# Patient Record
Sex: Female | Born: 1950 | Race: White | Hispanic: No | Marital: Married | Smoking: Never smoker
Health system: Southern US, Community
[De-identification: ages and names within clinical notes are randomized; demographics above are authoritative.]

## PROBLEM LIST (undated history)

## (undated) DIAGNOSIS — E119 Type 2 diabetes mellitus without complications: Secondary | ICD-10-CM

## (undated) DIAGNOSIS — R519 Headache, unspecified: Secondary | ICD-10-CM

## (undated) DIAGNOSIS — G4733 Obstructive sleep apnea (adult) (pediatric): Secondary | ICD-10-CM

## (undated) DIAGNOSIS — E785 Hyperlipidemia, unspecified: Secondary | ICD-10-CM

## (undated) DIAGNOSIS — J42 Unspecified chronic bronchitis: Secondary | ICD-10-CM

## (undated) DIAGNOSIS — Z9989 Dependence on other enabling machines and devices: Secondary | ICD-10-CM

## (undated) DIAGNOSIS — J449 Chronic obstructive pulmonary disease, unspecified: Secondary | ICD-10-CM

## (undated) DIAGNOSIS — J189 Pneumonia, unspecified organism: Secondary | ICD-10-CM

## (undated) DIAGNOSIS — J309 Allergic rhinitis, unspecified: Secondary | ICD-10-CM

## (undated) DIAGNOSIS — R51 Headache: Secondary | ICD-10-CM

## (undated) DIAGNOSIS — I10 Essential (primary) hypertension: Secondary | ICD-10-CM

## (undated) DIAGNOSIS — M1990 Unspecified osteoarthritis, unspecified site: Secondary | ICD-10-CM

## (undated) DIAGNOSIS — K219 Gastro-esophageal reflux disease without esophagitis: Secondary | ICD-10-CM

## (undated) DIAGNOSIS — J3800 Paralysis of vocal cords and larynx, unspecified: Secondary | ICD-10-CM

## (undated) DIAGNOSIS — J45909 Unspecified asthma, uncomplicated: Secondary | ICD-10-CM

## (undated) HISTORY — PX: APPENDECTOMY: SHX54

## (undated) HISTORY — DX: Hyperlipidemia, unspecified: E78.5

## (undated) HISTORY — DX: Obstructive sleep apnea (adult) (pediatric): G47.33

## (undated) HISTORY — DX: Unspecified asthma, uncomplicated: J45.909

## (undated) HISTORY — DX: Paralysis of vocal cords and larynx, unspecified: J38.00

## (undated) HISTORY — PX: KNEE ARTHROSCOPY: SUR90

## (undated) HISTORY — DX: Type 2 diabetes mellitus without complications: E11.9

## (undated) HISTORY — DX: Allergic rhinitis, unspecified: J30.9

## (undated) HISTORY — PX: CATARACT EXTRACTION, BILATERAL: SHX1313

## (undated) HISTORY — PX: ROTATOR CUFF REPAIR: SHX139

## (undated) HISTORY — PX: CHOLECYSTECTOMY: SHX55

## (undated) HISTORY — DX: Essential (primary) hypertension: I10

## (undated) HISTORY — DX: Dependence on other enabling machines and devices: Z99.89

---

## 2000-04-06 ENCOUNTER — Ambulatory Visit (HOSPITAL_BASED_OUTPATIENT_CLINIC_OR_DEPARTMENT_OTHER): Admission: RE | Admit: 2000-04-06 | Discharge: 2000-04-06 | Payer: Self-pay | Admitting: Internal Medicine

## 2001-11-02 ENCOUNTER — Other Ambulatory Visit: Admission: RE | Admit: 2001-11-02 | Discharge: 2001-11-02 | Payer: Self-pay | Admitting: Diagnostic Radiology

## 2002-05-18 HISTORY — PX: OTHER SURGICAL HISTORY: SHX169

## 2004-04-02 ENCOUNTER — Ambulatory Visit (HOSPITAL_BASED_OUTPATIENT_CLINIC_OR_DEPARTMENT_OTHER): Admission: RE | Admit: 2004-04-02 | Discharge: 2004-04-02 | Payer: Self-pay | Admitting: Internal Medicine

## 2004-08-07 ENCOUNTER — Observation Stay (HOSPITAL_COMMUNITY): Admission: EM | Admit: 2004-08-07 | Discharge: 2004-08-08 | Payer: Self-pay | Admitting: Orthopedic Surgery

## 2004-10-22 ENCOUNTER — Ambulatory Visit: Payer: Self-pay | Admitting: Internal Medicine

## 2005-01-22 ENCOUNTER — Ambulatory Visit: Payer: Self-pay | Admitting: Internal Medicine

## 2005-03-30 ENCOUNTER — Ambulatory Visit: Payer: Self-pay | Admitting: Internal Medicine

## 2005-07-22 ENCOUNTER — Ambulatory Visit: Payer: Self-pay | Admitting: Internal Medicine

## 2005-09-24 ENCOUNTER — Ambulatory Visit: Payer: Self-pay | Admitting: Internal Medicine

## 2005-09-29 ENCOUNTER — Ambulatory Visit: Payer: Self-pay | Admitting: Internal Medicine

## 2005-09-30 ENCOUNTER — Ambulatory Visit: Payer: Self-pay | Admitting: Cardiology

## 2005-10-05 ENCOUNTER — Ambulatory Visit: Payer: Self-pay | Admitting: Internal Medicine

## 2005-11-04 ENCOUNTER — Ambulatory Visit (HOSPITAL_COMMUNITY): Admission: RE | Admit: 2005-11-04 | Discharge: 2005-11-04 | Payer: Self-pay | Admitting: Otolaryngology

## 2006-01-04 ENCOUNTER — Ambulatory Visit: Payer: Self-pay | Admitting: Internal Medicine

## 2006-01-21 ENCOUNTER — Ambulatory Visit: Payer: Self-pay | Admitting: Internal Medicine

## 2006-03-18 ENCOUNTER — Ambulatory Visit: Payer: Self-pay | Admitting: Internal Medicine

## 2006-07-07 ENCOUNTER — Ambulatory Visit: Payer: Self-pay | Admitting: Internal Medicine

## 2006-10-14 ENCOUNTER — Ambulatory Visit: Payer: Self-pay | Admitting: Pulmonary Disease

## 2007-01-03 ENCOUNTER — Ambulatory Visit: Payer: Self-pay | Admitting: Internal Medicine

## 2007-01-25 ENCOUNTER — Encounter: Admission: RE | Admit: 2007-01-25 | Discharge: 2007-01-25 | Payer: Self-pay | Admitting: Orthopedic Surgery

## 2007-02-17 ENCOUNTER — Ambulatory Visit (HOSPITAL_COMMUNITY): Admission: RE | Admit: 2007-02-17 | Discharge: 2007-02-18 | Payer: Self-pay | Admitting: Orthopedic Surgery

## 2007-06-09 ENCOUNTER — Telehealth: Payer: Self-pay | Admitting: Internal Medicine

## 2007-06-17 DIAGNOSIS — J3089 Other allergic rhinitis: Secondary | ICD-10-CM

## 2007-06-17 DIAGNOSIS — J302 Other seasonal allergic rhinitis: Secondary | ICD-10-CM | POA: Insufficient documentation

## 2007-06-17 DIAGNOSIS — J383 Other diseases of vocal cords: Secondary | ICD-10-CM | POA: Insufficient documentation

## 2007-07-20 ENCOUNTER — Ambulatory Visit: Payer: Self-pay | Admitting: Internal Medicine

## 2007-08-06 DIAGNOSIS — G4733 Obstructive sleep apnea (adult) (pediatric): Secondary | ICD-10-CM | POA: Insufficient documentation

## 2007-12-30 ENCOUNTER — Telehealth (INDEPENDENT_AMBULATORY_CARE_PROVIDER_SITE_OTHER): Payer: Self-pay | Admitting: *Deleted

## 2008-01-20 ENCOUNTER — Ambulatory Visit: Payer: Self-pay | Admitting: Internal Medicine

## 2008-02-08 ENCOUNTER — Encounter: Payer: Self-pay | Admitting: Internal Medicine

## 2008-04-27 ENCOUNTER — Ambulatory Visit: Payer: Self-pay | Admitting: Internal Medicine

## 2008-05-25 ENCOUNTER — Encounter: Payer: Self-pay | Admitting: Internal Medicine

## 2008-06-11 ENCOUNTER — Telehealth: Payer: Self-pay | Admitting: Internal Medicine

## 2008-06-13 ENCOUNTER — Ambulatory Visit: Payer: Self-pay | Admitting: Internal Medicine

## 2008-06-13 DIAGNOSIS — J45909 Unspecified asthma, uncomplicated: Secondary | ICD-10-CM | POA: Insufficient documentation

## 2008-10-25 ENCOUNTER — Encounter: Payer: Self-pay | Admitting: Internal Medicine

## 2008-11-01 ENCOUNTER — Telehealth: Payer: Self-pay | Admitting: Internal Medicine

## 2008-11-02 ENCOUNTER — Encounter: Payer: Self-pay | Admitting: Internal Medicine

## 2009-10-16 ENCOUNTER — Telehealth (INDEPENDENT_AMBULATORY_CARE_PROVIDER_SITE_OTHER): Payer: Self-pay | Admitting: *Deleted

## 2010-02-21 ENCOUNTER — Encounter: Payer: Self-pay | Admitting: Internal Medicine

## 2010-03-25 ENCOUNTER — Telehealth: Payer: Self-pay | Admitting: Internal Medicine

## 2010-03-28 ENCOUNTER — Encounter: Payer: Self-pay | Admitting: Internal Medicine

## 2010-05-02 ENCOUNTER — Encounter: Payer: Self-pay | Admitting: Internal Medicine

## 2010-05-02 ENCOUNTER — Ambulatory Visit: Payer: Self-pay | Admitting: Internal Medicine

## 2010-05-02 LAB — CONVERTED CEMR LAB: IgE (Immunoglobulin E), Serum: 86.3 intl units/mL (ref 0.0–180.0)

## 2010-05-13 ENCOUNTER — Encounter: Payer: Self-pay | Admitting: Internal Medicine

## 2010-05-16 ENCOUNTER — Encounter: Payer: Self-pay | Admitting: Internal Medicine

## 2010-06-02 ENCOUNTER — Ambulatory Visit
Admission: RE | Admit: 2010-06-02 | Discharge: 2010-06-02 | Payer: Self-pay | Source: Home / Self Care | Attending: Internal Medicine | Admitting: Internal Medicine

## 2010-06-17 ENCOUNTER — Telehealth: Payer: Self-pay | Admitting: Internal Medicine

## 2010-06-17 NOTE — Letter (Signed)
Summary: [facility]  [facility]   Imported By: Sherian Rein 03/03/2010 14:52:52  _____________________________________________________________________  External Attachment:    Type:   Image     Comment:   External Document

## 2010-06-17 NOTE — Progress Notes (Signed)
  Phone Note Other Incoming   Request: Send information Summary of Call: Request for records received from [facility] Request forwarded to [employer].

## 2010-06-17 NOTE — Progress Notes (Signed)
Summary: cpap order, needs OV - LMTCB x 1  Phone Note Call from Patient Call back at [phone]   Caller: Patient Call For: Dylann Gallier Summary of Call: need order for new c pap machine Initial call taken by: Rickard Patience,  March 25, 2010 3:59 PM  Follow-up for Phone Call        Last OV with CY 06/13/08 - no pending OV.   [facility]  Crystal Jones RN  March 25, 2010 4:21 PM   Additional Follow-up for Phone Call Additional follow up Details #1::        Spoke with pt.  She states that her CPAP machine is no longer working properly and requests that order be sent to [facility] for a new one.  Pls advise if okay thanks! Additional Follow-up by: Vernie Murders,  March 25, 2010 4:27 PM    Additional Follow-up for Phone Call Additional follow up Details #2::    OK  [facility] for pt to return my call. Rhonda Cobb  March 26, 2010 2:22 PM Pt returned call and advised pt that an order for a replacement cpap has been faxed to [place]. Allow 48 hours to process order. Pt also scheduled yearly follow up appt for 05/02/10 at 9:45 to see Dr. Maple Hudson. Appt card mailed to pt. Pt aware. Alfonso Ramus  March 26, 2010 3:10 PM  Follow-up by: Waymon Budge MD,  March 26, 2010 2:16 PM  New/Updated Medications: * CPAP 11 CM. Christoper Allegra

## 2010-06-19 NOTE — Assessment & Plan Note (Signed)
Summary: F/U SLEEP APNEA ON CPAP/[facility]   Primary Provider/Referring Provider:  Leonette Most Record  CC:  Follow up visit-sleep(using CPAP each night); wheezing increased and SOB worse with activity..  History of Present Illness: 01/20/08- 60 year old woman returning for follow-up of obstructive sleep apnea with rhinitis.  Significant history of vocal cord, parous os requiring stent.  She is seeing a neurologist to a global headache. Continue CPAP 11 CWP every night and does not snore with this. Not severely sleepy. Cough and wheeze this summer, treated with albuterol, two or 3 times daily.  Medication talk done.  Environmental precautions talk done. Skin tests were positive, and she was briefly on allergy vaccine in 1993, indicating an atopic component.  06/13/08- OSA, allergic rhinitis, vocal cord paresis stent 3 weeks wheeze and cough, with ache all over. Has had both flu vax.  No energy, easy fatigue, Now stuffy head congestion, still some wheeze Scant sputm, not purlulent. Began with minimal temp elevation but no fever or chills. On second round of antibiotic and prednisone.taper, using her nebulizer. Head congestion has been worse than chest and she has had to miss some work.   May 02, 2010- OSA, allergic rhinitis, vocal cord paresis stent Nurse-CC:Follow up visit-sleep(using CPAP each night); wheezing increased and SOB worse with activity. OSA- Can't sleep without CPAP- very compliant and it helps. 11 cwp. Asthma- comes and goes with weather. Few colds. No ER but has needed prednisone 3x in past year.  GERD- little aware- Nexium. Vocal cords- ususally has some daily throat clearing and hoarseness, affected by weather.  Nasal congestion with sniff.  Dr Record has her up to date on Pneumovax and flu vax.    Asthma History    Initial Asthma Severity Rating:    Age range: 12+ years    Symptoms: daily    Nighttime Awakenings: 0-2/month    Interferes w/ normal activity: minor limitations   SABA use (not for EIB): daily    Asthma Severity Assessment: Moderate Persistent   Preventive Screening-Counseling & Management  Alcohol-Tobacco     Smoking Status: never  Current Medications (verified): 1)  Cpap 11 Cm. .... [facility] 2)  Promethazine-Codeine 6.25-10 Mg/35ml Syrp (Promethazine-Codeine) .Marland Kitchen.. 1 Teaspoon Four Times A Day As Needed Cough 3)  Metformin Hcl 500 Mg Tabs (Metformin Hcl) .... Take 2 By Mouth Once Daily 4)  Claritin 10 Mg  Tabs (Loratadine) .... Once Daily 5)  Singulair 10 Mg Tabs (Montelukast Sodium) .... Take 1 By Mouth Once Daily 6)  Valium 5 Mg  Tabs (Diazepam) .... Take 1 By Mouth At Bedtime As Needed Sleep 7)  Lipitor 40 Mg Tabs (Atorvastatin Calcium) .... Take 1 By Mouth Once Daily 8)  Zoloft 100 Mg  Tabs (Sertraline Hcl) .... 2 Tablets Once Daily 9)  Fluticasone Propionate 50 Mcg/act Susp (Fluticasone Propionate) .Marland Kitchen.. 1-2 Sprays Each Nostril Daily 10)  Xopenex Hfa 45 Mcg/act Aero (Levalbuterol Tartrate) .... 2 Pufss Four Times A Day As Needed 11)  Pulmicort Flexhaler 180 Mcg/act Aepb (Budesonide) .... 2 Puffs and Rinse, Two Times A Day 12)  Skelaxin 800 Mg Tabs (Metaxalone) .... Take 1 By Mouth Four Times A Day 13)  Astelin 137 Mcg/spray Soln (Azelastine Hcl) .... 2 Sprays in Each Nostril Once Daily  Allergies (verified): No Known Drug Allergies  Past History:  Past Surgical History: Last updated: 06/13/2008 vocal cord stent C-section Appendectomy Cholecystectomy rotator cuff left knee arthroscopy  Family History: Last updated: 2008-02-13 mother deceased age 66 from heart problems--hx of DM,arthritis father deceased  age 36 from heart problems  hx of heart and lung issues no siblings  Social History: Last updated: 02/08/2008 Patient never smoked.  exposed to second hand smoke exercises 3 times per week caffeine use:  2 cups per day married 1 child  Risk Factors: Smoking Status: never (05/02/2010)  Past Medical History: Sleep Apnea-  NPSG 04/06/00,AHI 33/ hr, desat to 81%, cpap to 9= AHI1.4,  obesity hypoventilation allergic rhinitis- allergy vaccine 1993 Vocal cord paresis- stent asthmatic bronchitis- PFT 05/02/10- FEV1 2.3/ 75%, FEV1/FVC 0.77. head ache  Review of Systems      See HPI       The patient complains of shortness of breath with activity, non-productive cough, acid heartburn, and nasal congestion/difficulty breathing through nose.  The patient denies shortness of breath at rest, productive cough, coughing up blood, chest pain, irregular heartbeats, indigestion, loss of appetite, weight change, abdominal pain, difficulty swallowing, sore throat, tooth/dental problems, headaches, sneezing, itching, ear ache, rash, change in color of mucus, and fever.    Vital Signs:  Patient profile:   60 year old female Height:      68.5 inches Weight:      281.50 pounds BMI:     42.33 O2 Sat:      94 % on Room air Pulse rate:   82 / minute BP sitting:   132 / 62  (left arm) Cuff size:   large  Vitals Entered By: Reynaldo Minium CMA (May 02, 2010 9:34 AM)  O2 Flow:  Room air CC: Follow up visit-sleep(using CPAP each night); wheezing increased and SOB worse with activity.   Physical Exam  Additional Exam:  General: A/Ox3; pleasant and cooperative, NAD, very obese,  SKIN: no rash, lesions NODES: no lymphadenopathy HEENT: [place]/AT, EOM- WNL, Conjuctivae- clear, PERRLA, TM-WNL, Nose- clear, Throat- clear and wnl, hoarse without stridor. Steady sniffing and throat clearing NECK: Supple w/ fair ROM, JVD- none, normal carotid impulses w/o bruits Thyroid- normal to palpation CHEST: Clear to P&A, dry cough HEART: RRR, no m/g/r heard ABDOMEN: obese KGM:WNUU, nl pulses, no edema  NEURO: Grossly intact to observation      Impression & Recommendations:  Problem # 1:  ASTHMA (ICD-493.90) Moderate persistent symptoms. We will get IgE level and see if she might be a Xolair canddidate.  Office spirometry today: FVC  2.9/ 76%, FEV1 2.3/ 75% R 0.77. Mild obstructive disease. Mild restriction probably reflects obesity/ hypoventilation, rather than airtrapping or a restrictive parenchymal disease.   Problem # 2:  ALLERGIC RHINITIS (ICD-477.9)  Steady sniffing and throat clearing while here today. Failed allergy vaccine trial in past.  Her updated medication list for this problem includes:    Claritin 10 Mg Tabs (Loratadine) ..... Once daily    Fluticasone Propionate 50 Mcg/act Susp (Fluticasone propionate) .Marland Kitchen... 1-2 sprays each nostril daily    Astelin 137 Mcg/spray Soln (Azelastine hcl) .Marland Kitchen... 2 sprays in each nostril once daily  Problem # 3:  SLEEP APNEA (ICD-780.57) Good compliance and control  Medications Added to Medication List This Visit: 1)  Promethazine-codeine 6.25-10 Mg/82ml Syrp (Promethazine-codeine) .Marland Kitchen.. 1 teaspoon four times a day as needed cough 2)  Metformin Hcl 500 Mg Tabs (Metformin hcl) .... Take 2 by mouth once daily 3)  Proair Hfa 108 (90 Base) Mcg/act Aers (Albuterol sulfate) .... 2 puffs four times a day as needed 4)  Singulair 10 Mg Tabs (Montelukast sodium) .... Take 1 by mouth once daily 5)  Lipitor 40 Mg Tabs (Atorvastatin calcium) .... Take 1 by  mouth once daily 6)  Xopenex Hfa 45 Mcg/act Aero (Levalbuterol tartrate) .... 2 pufss four times a day as needed 7)  Pulmicort Flexhaler 180 Mcg/act Aepb (Budesonide) .... 2 puffs and rinse, two times a day 8)  Astelin 137 Mcg/spray Soln (Azelastine hcl) .... 2 sprays in each nostril once daily  Other Orders: Est. Patient Level IV (84696) T-IgE (Immunoglobulin E) ([id number])  Patient Instructions: 1)  Please schedule a follow-up appointment in 1 month. 2)  Lab 3)  Office spirometry today Prescriptions: PROMETHAZINE-CODEINE 6.25-10 MG/5ML SYRP (PROMETHAZINE-CODEINE) 1 teaspoon four times a day as needed cough  #200 ml x 1   Entered and Authorized by:   Waymon Budge MD   Signed by:   Waymon Budge MD on 05/02/2010   Method  used:   Print then Give to Patient   RxID:   [id number] PULMICORT FLEXHALER 180 MCG/ACT AEPB (BUDESONIDE) 2 puffs and rinse, two times a day  #1 x prn   Entered and Authorized by:   Waymon Budge MD   Signed by:   Waymon Budge MD on 05/02/2010   Method used:   Historical   RxID:   [id number]

## 2010-06-19 NOTE — Assessment & Plan Note (Signed)
Summary: rov 1 month///kp   Primary Provider/Referring Provider:  Leonette Most Record  CC:  1 month follow up visit-sleep apnea; using CPAP each night and no compliants..  History of Present Illness: 06/13/08- OSA, allergic rhinitis, vocal cord paresis stent 3 weeks wheeze and cough, with ache all over. Has had both flu vax.  No energy, easy fatigue, Now stuffy head congestion, still some wheeze Scant sputm, not purlulent. Began with minimal temp elevation but no fever or chills. On second round of antibiotic and prednisone.taper, using her nebulizer. Head congestion has been worse than chest and she has had to miss some work.   May 02, 2010- OSA, allergic rhinitis, vocal cord paresis stent Nurse-CC:Follow up visit-sleep(using CPAP each night); wheezing increased and SOB worse with activity. OSA- Can't sleep without CPAP- very compliant and it helps. 11 cwp. Asthma- comes and goes with weather. Few colds. No ER but has needed prednisone 3x in past year.  GERD- little aware- Nexium. Vocal cords- ususally has some daily throat clearing and hoarseness, affected by weather.  Nasal congestion with sniff.  Dr Record has her up to date on Pneumovax and flu vax.   June 02, 2010-  OSA, allergic rhinitis, vocal cord paresis stent Nurse-CC: 1 month follow up visit-sleep apnea; using CPAP each night and no compliants. IgE- 86.3  05/02/10 Spirometry 04/28/10- Mild obstruction, FEV1 2.29/ 76%, R 0.76 Question if some of her airflow slowing reflewcts her vocal cord stent. Sniffing and throat clearing are aggravating. Admits some chest tight and wheeze. No acute illness.  Uses rescue inhaler Xopenex every 1-2 days. Continues Pulmicort mantenance.  Compliant w/ CPAP at 11 all night every night- helps.  She hadn't found Astelin very helpful and isn't taking antihistamine.        Asthma History    Asthma Control Assessment:    Age range: 12+ years    Symptoms: >2 days/week    Nighttime  Awakenings: 0-2/month    Interferes w/ normal activity: no limitations    SABA use (not for EIB): >2 days/week    FEV1 Pred: 2.998 liters (today)    Asthma Control Assessment: Not Well Controlled   Preventive Screening-Counseling & Management  Alcohol-Tobacco     Smoking Status: never  Current Medications (verified): 1)  Cpap 11 Cm. .... [facility] 2)  Promethazine-Codeine 6.25-10 Mg/34ml Syrp (Promethazine-Codeine) .Marland Kitchen.. 1 Teaspoon Four Times A Day As Needed Cough 3)  Metformin Hcl 500 Mg Tabs (Metformin Hcl) .... Take 2 By Mouth Once Daily 4)  Claritin 10 Mg  Tabs (Loratadine) .... Once Daily 5)  Singulair 10 Mg Tabs (Montelukast Sodium) .... Take 1 By Mouth Once Daily 6)  Valium 5 Mg  Tabs (Diazepam) .... Take 1 By Mouth At Bedtime As Needed Sleep 7)  Lipitor 40 Mg Tabs (Atorvastatin Calcium) .... Take 1 By Mouth Once Daily 8)  Zoloft 100 Mg  Tabs (Sertraline Hcl) .... 2 Tablets Once Daily 9)  Fluticasone Propionate 50 Mcg/act Susp (Fluticasone Propionate) .Marland Kitchen.. 1-2 Sprays Each Nostril Daily 10)  Xopenex Hfa 45 Mcg/act Aero (Levalbuterol Tartrate) .... 2 Pufss Four Times A Day As Needed 11)  Pulmicort Flexhaler 180 Mcg/act Aepb (Budesonide) .... 2 Puffs and Rinse, Two Times A Day 12)  Skelaxin 800 Mg Tabs (Metaxalone) .... Take 1 By Mouth Four Times A Day 13)  Astelin 137 Mcg/spray Soln (Azelastine Hcl) .... 2 Sprays in Each Nostril Once Daily  Allergies (verified): No Known Drug Allergies  Past History:  Past Medical History: Last updated: 05/02/2010 Sleep  Apnea- NPSG 04/06/00,AHI 33/ hr, desat to 81%, cpap to 9= AHI1.4,  obesity hypoventilation allergic rhinitis- allergy vaccine 1993 Vocal cord paresis- stent asthmatic bronchitis- PFT 05/02/10- FEV1 2.3/ 75%, FEV1/FVC 0.77. head ache  Past Surgical History: Last updated: 06/13/2008 vocal cord stent C-section Appendectomy Cholecystectomy rotator cuff left knee arthroscopy  Family History: Last updated:  2008/02/14 mother deceased age 62 from heart problems--hx of DM,arthritis father deceased age 82 from heart problems  hx of heart and lung issues no siblings  Social History: Last updated: 14-Feb-2008 Patient never smoked.  exposed to second hand smoke exercises 3 times per week caffeine use:  2 cups per day married 1 child  Risk Factors: Smoking Status: never (06/02/2010)  Review of Systems      See HPI       The patient complains of shortness of breath with activity, non-productive cough, nasal congestion/difficulty breathing through nose, and sneezing.  The patient denies shortness of breath at rest, productive cough, coughing up blood, chest pain, irregular heartbeats, acid heartburn, indigestion, loss of appetite, weight change, abdominal pain, difficulty swallowing, sore throat, tooth/dental problems, headaches, itching, ear ache, rash, change in color of mucus, and fever.    Vital Signs:  Patient profile:   60 year old female Height:      68.5 inches Weight:      286.50 pounds BMI:     43.08 O2 Sat:      96 % on Room air Pulse rate:   71 / minute BP sitting:   136 / 60  (left arm) Cuff size:   large  Vitals Entered By: Reynaldo Minium CMA (June 02, 2010 9:21 AM)  O2 Flow:  Room air CC: 1 month follow up visit-sleep apnea; using CPAP each night and no compliants.   Physical Exam  Additional Exam:  General: A/Ox3; pleasant and cooperative, NAD, very obese,  SKIN: no rash, lesions NODES: no lymphadenopathy HEENT: [place]/AT, EOM- WNL, Conjuctivae- clear, PERRLA, TM-WNL, Nose- clear, Throat- clear and wnl, Mallampati III-IV,  hoarse without stridor. Steady sniffing and throat clearing again noted NECK: Supple w/ fair ROM, JVD- none, normal carotid impulses w/o bruits Thyroid- normal to palpation CHEST: Clear to P&A, dry cough HEART: RRR, no m/g/r heard ABDOMEN: obese ZOX:WRUE, nl pulses, no edema , using a cane NEURO: Grossly intact to  observation      Pre-Spirometry FEV1    Pred: 2.998 L     Impression & Recommendations:  Problem # 1:  ALLERGIC RHINITIS (ICD-477.9)  Impressive, steady watery sniffing. We will have her try an otc antihistamine. Her updated medication list for this problem includes:    Claritin 10 Mg Tabs (Loratadine) ..... Once daily    Fluticasone Propionate 50 Mcg/act Susp (Fluticasone propionate) .Marland Kitchen... 1-2 sprays each nostril daily    Astelin 137 Mcg/spray Soln (Azelastine hcl) .Marland Kitchen... 2 sprays in each nostril once daily  Problem # 2:  ASTHMA (ICD-493.90) I want her to try Spiriva. The drying effect might be helpful. I don't know that she is bad enough to need a trial of Xolair.  Problem # 3:  SLEEP APNEA (ICD-780.57)  Good compliance and control. Her weight is getting ahead of her again and weight loss is encouraged.   Problem # 4:  OTHER VOICE DISTURBANCE (ICD-784.49)  Her vocal cord problems add to and likely are aggravated by her throat clearing. This was discussed.   Other Orders: Est. Patient Level IV (45409)  Patient Instructions: 1)  Please schedule a follow-up appointment in  3 months. 2)  Suggest you try an otc nonsedating antihistamine like loratadine/ Claritin to dry up watery sniffing. 3)  Try sample Spiriva 1 daily for cough/ wheeze/ chest tightness. It can cause dry mouth which may actually be helpful for you.

## 2010-06-19 NOTE — Letter (Signed)
Summary: Judene Companion MD/[facility]  Judene Companion MD/[facility]   Imported By: Lester [place] 06/04/2010 09:23:05  _____________________________________________________________________  External Attachment:    Type:   Image     Comment:   External Document

## 2010-06-19 NOTE — Miscellaneous (Signed)
Summary: Appt Cx/[facility]  Appt Cx/[facility]   Imported By: Lester [place] 06/04/2010 09:39:10  _____________________________________________________________________  External Attachment:    Type:   Image     Comment:   External Document

## 2010-06-25 NOTE — Progress Notes (Signed)
Summary: prescription for sprivia  Phone Note Call from Patient   Caller: Patient Call For: Young Summary of Call: Patient phoned regarding the Sprivia she stated that she was to call back if she wanted to go on the sprivia. She uses [facility] in [place] [phone]. Patient can be reached [phone] She is supposed to mail in her prescriptions so if you can call one in please and than mail her a prescription for the next one.  Initial call taken by: Vedia Coffer,  June 17, 2010 9:10 AM  Follow-up for Phone Call        Spoke with pt and she  states that spiriva is working well for her and she is requesting a 30 day supply be sent to local pharmacy and also a 90 day [facility]. Please advise if ok to send rx. Pt staets no need for call back. Carron Curie CMA  June 17, 2010 10:46 AM   Additional Follow-up for Phone Call Additional follow up Details #1::        per CDY-okay.Reynaldo Minium CMA  June 17, 2010 11:34 AM   refills sent. Carron Curie CMA  June 17, 2010 11:53 AM     New/Updated Medications: SPIRIVA HANDIHALER 18 MCG CAPS (TIOTROPIUM BROMIDE MONOHYDRATE) Once daily Prescriptions: SPIRIVA HANDIHALER 18 MCG CAPS (TIOTROPIUM BROMIDE MONOHYDRATE) Once daily  #90 x 3   Entered by:   Carron Curie CMA   Authorized by:   Waymon Budge MD   Signed by:   Carron Curie CMA on 06/17/2010   Method used:   Faxed to ...       [facility])       [address] [address]       [place], [place]  [address]       Ph: [phone]       Fax: [phone]   RxID:   [id number] SPIRIVA HANDIHALER 18 MCG CAPS (TIOTROPIUM BROMIDE MONOHYDRATE) Once daily  #30 x 0   Entered by:   Carron Curie CMA   Authorized by:   Waymon Budge MD   Signed by:   Carron Curie CMA on 06/17/2010   Method used:   Electronically to        [facility]  [id number] (retail)       [address]       [place], [place]  [address]       Ph:  [phone]       Fax: [phone]   RxID:   [id number]

## 2010-07-03 ENCOUNTER — Encounter: Payer: Self-pay | Admitting: Internal Medicine

## 2010-07-12 ENCOUNTER — Encounter: Payer: Self-pay | Admitting: Internal Medicine

## 2010-07-29 NOTE — Letter (Signed)
Summary: CMN for CPAP Supplies/[facility]  CMN for CPAP Supplies/[facility]   Imported By: Sherian Rein 07/25/2010 07:19:28  _____________________________________________________________________  External Attachment:    Type:   Image     Comment:   External Document

## 2010-09-01 ENCOUNTER — Encounter: Payer: Self-pay | Admitting: Internal Medicine

## 2010-09-02 ENCOUNTER — Ambulatory Visit: Payer: Self-pay | Admitting: Internal Medicine

## 2010-09-30 NOTE — Assessment & Plan Note (Signed)
[facility]                             PULMONARY OFFICE NOTE   Dominique Robbins, Dominique Robbins                  MRN:          [id number]  DATE:10/14/2006                            DOB:          Aug 28, 1950    HISTORY OF PRESENT ILLNESS:  Patient is a 60 year old white female  patient of Dr. Roxy Cedar who has a known history of allergic rhinitis,  chronic hoarseness with previous vocal cord paresis, status post stent,  and obstructive sleep apnea on nocturnal CPAP, presents today for a two  week history of productive cough with very thick mucus.  Fever, chills,  sweats, and wheezing.  Patient denies any hemoptysis, orthopnea, PND, or  leg swelling.   PAST MEDICAL HISTORY:  Reviewed.   CURRENT MEDICATIONS:  Reviewed.   PHYSICAL EXAMINATION:  GENERAL:  Patient is a pleasant female in no  acute distress.  VITAL SIGNS:  She is afebrile with stable vital signs.  O2 saturation  97% on room air.  HEENT:  Posterior pharynx is clear.  NECK:  Supple without cervical adenopathy.  LUNGS:  The lung sounds reveal coarse breath sounds bilaterally with a  few expiratory wheezes.  Marland Kitchen  CARDIAC:  Regular rate and rhythm.  ABDOMEN:  Soft and nontender.  EXTREMITIES:  Warm without any edema.   IMPRESSION/PLAN:  Acute exacerbation of asthmatic bronchitis.  Patient  is to begin Omnicef x7 days, Mucinex DM twice daily, prednisone taper  over the next week.  Patient was given a Xopenex nebulizer treatment in  the office.  Patient is to return back with Dr. Maple Hudson as scheduled or  sooner if needed.      Rubye Oaks, NP  Electronically Signed      Clinton D. Maple Hudson, MD, Tonny Bollman, FACP  Electronically Signed   TP/MedQ  DD: 10/14/2006  DT: 10/14/2006  Job #: [id number]

## 2010-09-30 NOTE — Assessment & Plan Note (Signed)
[facility]                             PULMONARY OFFICE NOTE   MAIDA, WIDGER                  MRN:          [id number]  DATE:01/03/2007                            DOB:          April 23, 1951    PROBLEM:  1. Obstructive sleep apnea on continuous positive airway pressure.  2. Allergic rhinitis.  3. Chronic hoarseness, vocal cord paresis and stent.  4. Obesity/hypoventilation.   HISTORY:  Nasal congestion blamed on ragweed.  We reviewed her history  of allergy testing in 1993 which had been positive for grass and tree  pollens, ragweed, dust and dust mite.  She tried allergy vaccine but  only for a couple of months and drifted off.  She occasionally feels  stopped up in her throat.  This does not seem to mean anything very  dramatic except that occasionally she feels she has some phlegm in her  throat.  She remains comfortable with her CPAP.   MEDICATIONS:  1. CPAP 11 CWP through [facility].  2. Nexium 40 mg b.i.d.  3. Claritin.  4. Singulair 10 mg.  5. Zoloft 100 mg x2.  6. Flonase.  7. Asmanex.  8. Lipitor.  9. Nadolol 40 mg.  10.Flexeril 10 mg at bedtime.  11.Reglan.  12.P.r.n. use of a nebulizer without albuterol and ipratropium  13.Albuterol inhaler.   NO MEDICATION ALLERGY.   OBJECTIVE:  Weight 272 pounds, blood pressure 130/62, pulse 61, room air  saturation 98%.  There is mild nasal turbinate edema, no visible  drainage, no conjunctival injection.  She is hoarse on a stable chronic  basis.  Some dry cough without wheeze, rhonchi or rales.  There is no  stridor, no adenopathy or edema.  Heart sounds are normal.   IMPRESSION:  1. Rhinitis.  2. Vocal cord stent.  3. Obstructive sleep apnea, stably controlled.   PLAN:  1. Try Allegra-D 12 hour one b.i.d. p.r.n.  2. Continue Flonase.  3. Environmental precautions.  4. Schedule return 6 months, earlier p.r.n.     Clinton D. Maple Hudson, MD, Tonny Bollman, FACP  Electronically  Signed    CDY/MedQ  DD: 01/17/2007  DT: 01/17/2007  Job #: [id number]   cc:   Leonette Most Record

## 2010-09-30 NOTE — Op Note (Signed)
NAMESHERRAN, MARGOLIS           ACCOUNT NO.:  [id number]   MEDICAL RECORD NO.:  [id number]          PATIENT TYPE:  AMB   LOCATION:  SDS                          FACILITY:  [facility]   PHYSICIAN:  Vania Rea. Supple, M.D.  DATE OF BIRTH:  03/24/1951   DATE OF PROCEDURE:  02/17/2007  DATE OF DISCHARGE:                               OPERATIVE REPORT   PREOPERATIVE DIAGNOSES:  1. Chronic right shoulder impingement syndrome.  2. Right shoulder acromioclavicular joint arthropathy.  3. Right shoulder bicipital tendon tendinopathy.   POSTOPERATIVE DIAGNOSES:  1. Chronic right shoulder impingement syndrome.  2. Right shoulder acromioclavicular joint arthropathy.  3. Partial articular rotator cuff tear.  4. Labral tear.  5. Biceps tendon partial tear.  6. Synovitis the glenohumeral joint.   PROCEDURE:  1. Right shoulder examination under anesthesia.  2. Right toward shoulder diagnostic arthroscopy.  3. Biceps tendon tenotomy.  4. Synovectomy glenohumeral joint.  5. Debridement of labral tear.  6. Debridement of partial articular rotator cuff tear.  7. Arthroscopic subacromial decompression bursectomy.  8. Arthroscopic distal clavicle resection.   SURGEON OF RECORD:  Vania Rea. Supple, M.D.   ASSISTANTFrench Ana Shuford PA-C.   ANESTHESIA:  General endotracheal.   ESTIMATED BLOOD LOSS:  Minimal.   DRAINS:  None.   HISTORY:  Ms. Friesenhahn is a 60 year old female who has had chronic right  shoulder pain with weakness and restrictions in mobility which has been  refractory to prolonged attempts at conservative management.  Preoperative x-rays confirm AC joint arthrosis with MRI scan showing  tendinosis the rotator cuff and biceps tendon tendinopathy.  Due to her  ongoing pain and functional limitation, she is brought to the operating  at this time for planned right shoulder arthroscopy as described below   Preoperatively I counseled Ms. Dangerfield on treatment options as well as  risks  versus benefits thereof.  Possible surgical complications  bleeding, infection, neurovascular injury, persistent pain, loss of  motion, anesthetic complication, possible need for additional surgery  were reviewed.  She understands and accepts and agrees with our planned  procedure.   PROCEDURE IN DETAIL:  After undergoing routine preop evaluation, the  patient received prophylactic antibiotics.  Due to her previous vocal  cord palsy, she was not given an interscalene block.  She was taken to  the operating room received prophylactic antibiotics and underwent  smooth induction of a general endotracheal anesthesia.  Turned to the  left lateral decubitus position on the beanbag and appropriately padded  and protected.  Right shoulder examination under anesthesia revealed  full passive motion.  There are no obvious instability patterns.  Right  arm suspended at 70 degrees of abduction with 15 pounds of traction.  The right shoulder girdle region was then sterilely prepped and draped  in standard fashion.  Posterior portal established in the glenohumeral  joint and anterior portal established under direct visualization.  Glenohumeral articular surfaces were in excellent condition.  There was  however diffuse synovitis and extensive synovectomy was performed and  the Arthrex wand was then used to obtain hemostasis.  The biceps tendon  showed marked attenuation and  tearing greater than 50% of its thickness  through its intra-articular course, particularly distally at the  bicipital groove.  With this finding we elected to proceed with a biceps  tendon tenotomy.  The Arthrex wand then used to tenotomize the biceps  tendon.  We then performed labral debridement for degenerative tears  anterior superior.  There is also a partial articular rotator cuff tear  which I would estimate approximately 15-20% thickness of the tendon.  These areas were debrided with a shaver.  Remaining inspection of the   glenohumeral joint showed no obvious additional intra-articular  pathology.  Fluid and instruments were then removed, the arm was dropped  down to 30 degrees of abduction with the arthroscope introduced in the  subacromial space of the posterior portal and direct lateral portal  established in the subacromial space.  There was abundant proliferative  bursal tissue with multiple adhesions and these were divided and excised  with combination of the shaver Arthrex wand.  The wand was then used to  remove the periosteum from the undersurface of the anterior half of the  acromion and subacromial depression performed a bur creating type 1  morphology.  A portal was established directly anterior to the distal  clavicle and distal clavicle resection performed with a bur.  Care was  taken to make sure the entire circumference the distal clavicle could be  visualized to ensure adequate removal of bone.  We then completed the  subacromial subdeltoid bursectomy.  Hemostasis was obtained.  We  carefully inspected and probed the bursal surface of the rotator cuff  and found this to be intact.  At this point all fluid and instruments  were then removed.  The portals were closed with Monocryl and Steri-  Strips.  A bulky dry dressing taped over the right shoulder, the right  arm placed in sling immobilizer.  The patient placed supine, extubated  and taken to recovery room in stable condition.      Vania Rea. Supple, M.D.  Electronically Signed     KMS/MEDQ  D:  02/17/2007  T:  02/18/2007  Job:  [id number]

## 2010-10-03 NOTE — Assessment & Plan Note (Signed)
[facility]                               PULMONARY OFFICE NOTE   DOROTHE, ELMORE                  MRN:          [id number]  DATE:03/18/2006                            DOB:          November 27, 1950    PROBLEMS:  1. Obstructive sleep apnea on C-pap.  2. Allergic rhinitis.  3. Chronic hoarseness, vocal cord paresis and stent.  4. Obesity/hypoventilation.   HISTORY:  She says an abscessed tooth was causing post-nasal drainage and  was the cause of a flare of laryngitis earlier this fall. She insists she is  now better and demonstrates a somewhat improved voice quality. She is  comfortable on C-PAP now set at 11 CWP through [place]. About twice a month,  she needs a nebulizer treatment for her chest and she is using her albuterol  inhaler about once a day, supporting an impression of asthma. She likes  Asmanex which has been very comfortable for her.   MEDICATIONS:  1. C-PAP at 11 CWP.  2. Nexium 40 mg b.i.d.  3. Claritin.  4. Singulair 10 mg.  5. Zoloft 100 mg x 2.  6. Zocor.  7. Flonase.  8. Asmanex 2 puffs.  9. Home nebulizer has albuterol and ipratropium.  10.Rescue albuterol inhaler.   OBJECTIVE:  Weight 228 pounds, blood pressure 130/78, pulse regular 58, room  air saturation 98%. Voice quality is not normal, but she is less hoarse, I  think, than last visit with no strider. She looks quite cheerful. Lung  fields are clear. Heart sounds regular without murmur or gallop. 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   IMPRESSION:  1. Rhinitis.  2. Chronic hoarseness reflecting her stent. Recent laryngitis has      improved.  3. Asthma.  4. Obstructive sleep apnea, well controlled.   PLAN:  No additional changes. Schedule return six months, earlier p.r.n.     Clinton D. Maple Hudson, MD, Tonny Bollman, FACP  Electronically Signed    CDY/MedQ  DD: 03/20/2006  DT: 03/21/2006  Job #: [id number]   cc:   Leonette Most  Record

## 2010-10-03 NOTE — Assessment & Plan Note (Signed)
[facility]                             PULMONARY OFFICE NOTE   Dominique Robbins, Dominique Robbins                  MRN:          [id number]  DATE:07/07/2006                            DOB:          25-Oct-1950    PROBLEMS:  1. Obstructive sleep apnea on CPAP.  2. Allergic rhinitis.  3. Chronic hoarseness, vocal cord paresis and stent.  4. Obesity/hypoventilation.   HISTORY:  She says she was doing well until 2 weeks ago when she began  wheeze and cough, requiring more frequent use of her inhaler and  nebulizer. CPAP continues now at 11 CWP and this seems to be a  comfortable pressure for her. There has been minimal nasal congestion,  no purulent discharge, or fever.   MEDICATIONS:  1. CPAP 11 CWP.  2. Nexium 40 mg b.i.d.  3. Claritin p.r.n.  4. Singulair 10 mg.  5. Zoloft 100 mg x2.  6. Zocor.  7. Flonase.  8. Asmanex 2 puffs.  9. Topamax 25 mg.  10.Starting February 10, she began amoxicillin.  11.Lipitor.  12.Home nebulizer with albuterol and ipratropium or rescue albuterol      inhaler.   ALLERGIES:  No medication allergy.   OBJECTIVE:  Weight 250 pounds, blood pressure 120/62, pulse regular 62,  room air saturation 97%. She is more hoarse than usual without strider.  There is mild bilateral and expiratory wheeze with little cough and no  dullness. Mild nasal congestion. Heart sounds are regular without  murmur.   IMPRESSION:  Asthmatic bronchitis and rhinitis with exacerbation. I  suspect she has had a viral illness. Sleep apnea seems well controlled  on CPAP at 11 CWP, but she needs to lose weight.   PLAN:  Options were discussed and medicines compared with the final  discussion to give Depo-Medrol 80 mg i.m. and prednisone 8 day taper  from 40 mg with steroid talk done. We  have refilled her nebulizer solution and her rescue albuterol. She is  given Phenergan with codeine cough syrup 5 ml every 6 hours p.r.n.  Schedule return 6 months,  earlier p.r.n.     Clinton D. Maple Hudson, MD, Tonny Bollman, FACP  Electronically Signed    CDY/MedQ  DD: 07/07/2006  DT: 07/08/2006  Job #: [id number]   cc:   Leonette Most Record

## 2010-10-03 NOTE — Op Note (Signed)
NAME:  Dominique Robbins, Dominique Robbins           ACCOUNT NO.:  [id number]   MEDICAL RECORD NO.:  [id number]          PATIENT TYPE:  AMB   LOCATION:  DAY                          FACILITY:  [facility]   PHYSICIAN:  Vania Rea. Supple, M.D.  DATE OF BIRTH:  Sep 15, 1950   DATE OF PROCEDURE:  08/07/2004  DATE OF DISCHARGE:                                 OPERATIVE REPORT   PREOPERATIVE DIAGNOSES:  Left knee medial meniscus tear.   POSTOPERATIVE DIAGNOSES:  1.  Left knee medial meniscus tear.  2.  Left knee patella chondromalacia.  3.  Left knee anterior chamber synovitis.   PROCEDURE:  1.  Left knee diagnostic arthroscopy.  2.  Partial medial meniscectomy.  3.  Chondroplasty of the patella.  4.  Extensive synovectomy.   SURGEON:  Vania Rea. Supple, M.D.   ANESTHESIA:  General endotracheal.   ESTIMATED BLOOD LOSS:  Minimal.   DRAINS:  None.   HISTORY:  Dominique Robbins is a 60 year old female whose had a persistent left  medial knee pain which has been refractory to prolonged attempts of  conservative management. Due to her ongoing pain and functional limitations,  she is brought to the operating room at this time for a left knee  arthroscopy with debridement as described below.   Preoperatively, Dominique Robbins was counseled on the treatment options as well  as the risks versus benefits thereof.  Possible surgical complications,  bleeding, infection, neurovascular injury, DVT, PE as well as persistent  pain was reviewed.  She understands and accepts and agrees to our planned  procedure.   DESCRIPTION OF PROCEDURE:  After undergoing routine preop evaluation, the  patient received prophylactic antibiotics.  Placed supine on the operating  table and underwent smooth induction of a general endotracheal anesthesia.  Left leg was placed in a leg holder and sterilely prepped and draped in  standard fashion. Standard portals were established and diagnostic  arthroscopy was performed. There was abundant  proliferative anterior chamber  synovitis and extensive synovectomy was performed. The synovial tissue was  encroaching upon the patellofemoral joint and was completely removed. The  patella showed broad grade 3 chondromalacia with central and medial facet  and a chondroplasty was performed down to a stable cartilaginous base. The  gutters were clear. The intercondylar notch showed the ACL to be intact with  negative intraoperative Lachman.  Medially there was a parrot beak tear of  the mid third of the medial meniscus. This was trimmed back to a stable  __________  margin with a basket and then a shaver was used for contouring  and removal of the meniscal fragments. We also performed a chondroplasty of  the medial femoral condyle for a broad area of grade 3 chondromalacia and  approximately 2 x 3 cm.  Laterally, the meniscus was approached, found to be  stable and intact and the articular surfaces were in excellent condition  laterally. At this point, the Arthrex hook probe was then used to obtain  hemostasis and the anterior fat pad region. Final irrigation was then  completed. Fluid and instruments were removed. A combination of Marcaine,  epi and morphine  was instilled into the joint.  In addition, Marcaine with  epi was instilled throughout the portals. The portals were closed with Steri-  Strips. A bulky dry dressing was taped about the left knee and left leg was  wrapped in an Ace bandage.  The patient was then transferred to the recovery  room in stable condition. Postop plan is for overnight observation with  sleep apnea protocol. Will have her use pulse ox as well as a CPAP machine.  Discharged in the morning. Followup in the office in a week to 10 days for a  wound check.      KMS/MEDQ  D:  08/07/2004  T:  08/07/2004  Job:  [id number]

## 2010-10-03 NOTE — Assessment & Plan Note (Signed)
[facility]                               PULMONARY OFFICE NOTE   Dominique, Robbins                  MRN:          [id number]  DATE:01/21/2006                            DOB:          1950-09-26    PROBLEMS:  1. Asthmatic bronchitis.  2. Allergic rhinitis.  3. Obesity/hypoventilation.  4. Obstructive sleep apnea.  5. Vocal cord paresis with stent.   HISTORY:  She comes because recently she has had some bitemporal headache.  She asked that I lower the CPAP from 12 CWP, thinking that might be waking  her some at night.  She is using her nebulizer treatment about once every  other month, sometimes twice, and she is using her albuterol inhaler about  once a day.  Feels a little lightheaded with nasal stuffiness, no true  vertigo.   MEDICATIONS:  1. CPAP, currently at 12.  2. Nexium 40 mg b.i.d.  3. Claritin.  4. Singulair 10 mg.  5. Zoloft 100 mg x2.  6. Zocor.  7. Flonase.  8. Home nebulizer with albuterol and ipratropium.  9. Albuterol rescue inhaler.  10.Endal.  11.Mucinex.   No medication allergies.   OBJECTIVE:  VITAL SIGNS:  Weight is 230 pounds.  BP 122/70, pulse regular at  85, room air saturation 97%.  GENERAL:  She does not seem distressed.  RESPIRATORY:  There is chronic hoarseness without real stridor.  I heard a  transient inspiratory wheeze at the right scapula.  I could not tell if that  was really wheeze or stridor, but I could not duplicate it.  LYMPHATIC:  I found no adenopathy.  CARDIAC:  Heart sounds were regular and normal.  EXTREMITIES:  There was no edema.   IMPRESSION:  1. Obstructive sleep apnea.  We will address her request to try a lower      CPAP pressure by having [facility] reduce her from 12 to 11 CWP.  2. Increased dizziness/head congestion.  May be seasonal rhinitis.  Note      that she had been skin test-positive and tried allergy vaccine briefly      in 1993 but did not stick with it, suggesting  that she is an atopic      individual.  3. Chronic hoarseness.  4. Uncertain component of asthma.   PLAN:  1. Sudafed PE.  2. Try Asmanex one puff daily with discussion.  3. [facility] to reduce CPAP to 11.  4. Keep efforts to reduce weight.  5. Schedule return 6 weeks but earlier p.r.n.                                   Clinton D. Maple Hudson, MD, FCCP, FACP   CDY/MedQ  DD:  01/21/2006  DT:  01/22/2006  Job #:  [id number]   cc:   Leonette Most Record

## 2010-10-03 NOTE — Procedures (Signed)
NAME:  Dominique Robbins, Dominique Robbins           ACCOUNT NO.:  [id number]   MEDICAL RECORD NO.:  [id number]          PATIENT TYPE:  OUT   LOCATION:  SLEEP CENTER                 FACILITY:  [facility]   PHYSICIAN:  Clinton D. Maple Hudson, M.D. DATE OF BIRTH:  1951/01/12   DATE OF STUDY:  04/02/2004                              NOCTURNAL POLYSOMNOGRAM   REFERRING PHYSICIAN:  Jetty Duhamel, MD   INDICATION FOR STUDY:  Hypersomnia with sleep apnea.  Epworth Sleepiness  Score 9/24, BMI 44, weight 295 pounds.   SLEEP ARCHITECTURE:  Total sleep time 383 minutes with sleep efficiency 80%.  Stage 1 was 21%, stage 2 50%, stages 3 and 4 15%, REM was 15% of total sleep  time.  Sleep latency 33 minutes, REM latency 304 minutes, awake after sleep  onset 70 minutes, arousal index 34.  Most arousals were related to  respiratory events and leg jerks.   RESPIRATORY DATA:  Split study protocol.  RDI 61.2 per hour indicating  severe obstructive sleep apnea/hypopnea syndrome before CPAP.  This included  5 obstructive apneas and 173 hypopneas before CPAP.  Events were not  positional.  REM RDI was absent.  CPAP was titrated to 12 CWP, RDI 3.7 per  hour using small, Mirage Swift nasal pillows mask with heated humidifier.   OXYGEN DATA:  Moderate snoring with oxygen desaturation to a nadir of 83%  before CPAP control.  After CPAP, oxygenation held 95 to 98% on room air.   CARDIAC DATA:  Normal sinus rhythm.   MOVEMENT/PARASOMNIA:  A total of 145 limb jerks were recorded of which 16  were associated with arousal or awakening for a periodic limb movement with  arousal index of 2.5 per hour, which may be mildly increased.   IMPRESSION/RECOMMENDATION:  Severe obstructive sleep apnea/hypopnea  syndrome, RDI 61.2 per hour with desaturation to 83%.  CPAP titration to 12  CWP, RDI 3.7 per hour using a Mirage Swift, size small, with heated  humidifier.  Periodic limb movement with arousal, 2.5 per hour.                                  Clinton D. Maple Hudson, M.D.  Diplomate, [employer]   CDY/MEDQ  D:  04/13/2004 14:42:25  T:  04/13/2004 15:21:12  Job:  [id number]

## 2010-10-03 NOTE — Assessment & Plan Note (Signed)
[facility]                               PULMONARY OFFICE NOTE   Dominique Robbins, Dominique Robbins                  MRN:          [id number]  DATE:01/04/2006                            DOB:          May 31, 1950    PROBLEM:  1. Asthmatic bronchitis.  2. Allergic rhinitis.  3. Obesity/hypoventilation.  4. Obstructive sleep apnea.  5. Vocal cord paresis with stent in.   HISTORY:  She says she is doing much better this time than at last visit.  The cold she had then completely resolved.  She remains a little hoarse and  she continues to be followed by ENT at [facility] because of her laryngeal  stents.  CPAP is used every night at 12 CWP.  She is not having significant  problems with the mid summer air quality currently.   MEDICATION:  CPAP 12, Nexium, Claritin, Singulair, Zoloft, Zocor, Flonase,  home nebulizer with albuterol and ipratropium, albuterol rescue inhaler,  Endal.   NO MEDICATION ALLERGY.   OBJECTIVE:  Weight 228 pounds, BP 108/60, pulse regular 69, room air  saturation 98%.  Mild hoarseness without stridor, palate length is 3-4/4  with no erythema or visible drainage, her nasal airway is clear.  She has a  couple of fever blisters on her lip.  Oral mucosa looks clean, there is no  adenopathy.  LUNGS:  Clear to P&A.  HEART SOUNDS:  Regular without murmur.   IMPRESSION:  1. Rhinitis.  2. Laryngitis with chronic splints but no history right now to suggest she      is having aspiration problems.  3. Obstructive sleep apnea is well controlled on continuous positive      airway pressure of 12.   PLAN:  1. Schedule return in 6 months, earlier p.r.n.  2. No changes in therapy to offer today.                                  Clinton D. Maple Hudson, MD, FCCP, FACP   CDY/MedQ  DD:  01/09/2006  DT:  01/10/2006  Job #:  [id number]   cc:   Leonette Most Record

## 2010-10-16 ENCOUNTER — Ambulatory Visit (INDEPENDENT_AMBULATORY_CARE_PROVIDER_SITE_OTHER): Payer: BC Managed Care – PPO | Admitting: Internal Medicine

## 2010-10-16 ENCOUNTER — Encounter: Payer: Self-pay | Admitting: Internal Medicine

## 2010-10-16 VITALS — BP 112/70 | HR 91 | Ht 68.5 in | Wt 270.4 lb

## 2010-10-16 DIAGNOSIS — J45909 Unspecified asthma, uncomplicated: Secondary | ICD-10-CM

## 2010-10-16 DIAGNOSIS — R498 Other voice and resonance disorders: Secondary | ICD-10-CM

## 2010-10-16 DIAGNOSIS — J309 Allergic rhinitis, unspecified: Secondary | ICD-10-CM

## 2010-10-16 DIAGNOSIS — G4730 Sleep apnea, unspecified: Secondary | ICD-10-CM

## 2010-10-16 MED ORDER — IPRATROPIUM BROMIDE 0.06 % NA SOLN: 2.0000 | Freq: Four times a day (QID) | NASAL | Status: DC

## 2010-10-16 MED ORDER — IPRATROPIUM BROMIDE HFA 17 MCG/ACT IN AERS: 2.0000 | INHALATION_SPRAY | Freq: Four times a day (QID) | RESPIRATORY_TRACT | Status: DC

## 2010-10-16 NOTE — Assessment & Plan Note (Signed)
She doesn't feel Spiriva lasts 12 hours. I will change that to an Atrovent inhaler.

## 2010-10-16 NOTE — Patient Instructions (Addendum)
Instead of Spiriva- try using Atrovent HFA rescue inhaler, 2 puffs up to 4 x daily as needed for cough, chest tightness, hacking.  Try using Ipratropium nasal spray up to 4 x daily if needed for postnasal drip.

## 2010-10-16 NOTE — Assessment & Plan Note (Signed)
Failed Astelin. We will continue fluticasone, but see if adding ipratropium nasal spray is any help.

## 2010-10-16 NOTE — Progress Notes (Signed)
  Subjective:    Patient ID: Dominique Robbins, female    DOB: 04-10-1951, 60 y.o.   MRN: [id number]  HPI 10/16/10- 4 yo never smoker followed for asthma, allergic rhinitis, OSA, vocal cord paresis/ hoarseness. Last here June 02, 2010. PFT then reviewd FEV1 2.29/76% and IgE 86.3.  Since then no real change "pretty good" bothered by humidity and oak tree pollen. Some silent reflux- on Nexium but no aspirational choke while eating. Had failed Astelin in past but used some antihistamines. Uses Flonase  CPAP compliance all night every night 11 cwp.  Spiriva helps but doesn't last 12 hours.   Review of Systems Constitutional:   No weight loss, night sweats,  Fevers, chills, fatigue, lassitude. HEENT:   No headaches,  Difficulty swallowing,  Tooth/dental problems,  Sore throat,                No sneezing, itching, ear ache, nasal congestion,  CV:  No chest pain,  Orthopnea, PND, swelling in lower extremities, anasarca, dizziness, palpitations  GI  No heartburn, indigestion, abdominal pain, nausea, vomiting, diarrhea, change in bowel habits, loss of appetite  Resp: No shortness of breath with exertion or at rest.  No excess mucus,   No coughing up of blood.  No change in color of mucus.  No wheezing.   Skin: no rash or lesions.  GU: no dysuria, change in color of urine, no urgency or frequency.  No flank pain.  MS:  No joint pain or swelling.  No decreased range of motion.  No back pain.  Psych:  No change in mood or affect. No depression or anxiety.  No memory loss.      Objective:   Physical Exam General- Alert, Oriented, Affect-appropriate, Distress- none acute.   obese  Skin- rash-none, lesions- none, excoriation- none  Lymphadenopathy- none  Head- atraumatic  Eyes- Gross vision intact, PERRLA, conjunctivae clear secretions  Ears- Hearing, canals, Tm - normal  Nose- Clear, No-  Septal dev, mucus, polyps, erosion, perforation frequent snorting and sniffing Throat-  Mallampati II , mucosa clear , drainage- none, tonsils- atrophic  Neck- flexible , trachea midline, no stridor , thyroid nl, carotid no bruit  Chest - symmetrical excursion , unlabored     Heart/CV- RRR , no murmur , no gallop  , no rub, nl s1 s2                     - JVD- none , edema- none, stasis changes- none, varices- none     Lung- clear to P&A, wheeze- none, cough- frequent hacking and throat clearing,            dullness-none, rub- none     Chest wall-   Abd- tender-no, distended-no, bowel sounds-present, HSM- no  Br/ Gen/ Rectal- Not done, not indicated  Extrem- cyanosis- none, clubbing, none, atrophy- none, strength- nl  Neuro- grossly intact to observation         Assessment & Plan:

## 2010-10-16 NOTE — Assessment & Plan Note (Signed)
Chronic hoarseness since vocal cord splint placed.

## 2010-10-16 NOTE — Assessment & Plan Note (Signed)
Great compliance and control at 11 cwp Christoper Allegra

## 2011-02-26 LAB — COMPREHENSIVE METABOLIC PANEL
ALT: 36 — ABNORMAL HIGH
Calcium: 9.5
Creatinine, Ser: 0.8
GFR calc Af Amer: >60
Glucose, Bld: 122 — ABNORMAL HIGH
Potassium: 3.9
Total Protein: 6.7

## 2011-02-26 LAB — DIFFERENTIAL
Basophils Absolute: 0
Basophils Relative: 0
Eosinophils Absolute: 0.1
Eosinophils Relative: 1
Lymphocytes Relative: 30
Lymphs Abs: 2.1
Monocytes Absolute: 0.5
Monocytes Relative: 7
Neutro Abs: 4.2
Neutrophils Relative %: 61

## 2011-02-26 LAB — PROTIME-INR
INR: 0.9
Prothrombin Time: 12.3

## 2011-02-26 LAB — APTT: aPTT: 27

## 2011-02-26 LAB — CBC
HCT: 40.3
MCV: 87.5
Platelets: 242
RBC: 4.61
WBC: 6.9

## 2011-02-26 LAB — URINE MICROSCOPIC-ADD ON

## 2011-02-26 LAB — URINALYSIS, ROUTINE W REFLEX MICROSCOPIC
Nitrite: NEGATIVE
Protein, ur: NEGATIVE
Specific Gravity, Urine: 1.024
Urobilinogen, UA: 0.2

## 2011-04-16 ENCOUNTER — Ambulatory Visit: Payer: BC Managed Care – PPO | Admitting: Internal Medicine

## 2011-05-11 ENCOUNTER — Encounter: Payer: Self-pay | Admitting: Internal Medicine

## 2011-05-11 ENCOUNTER — Ambulatory Visit (INDEPENDENT_AMBULATORY_CARE_PROVIDER_SITE_OTHER): Payer: BC Managed Care – PPO | Admitting: Internal Medicine

## 2011-05-11 VITALS — BP 130/72 | HR 80 | Ht 68.5 in | Wt 281.8 lb

## 2011-05-11 DIAGNOSIS — J45909 Unspecified asthma, uncomplicated: Secondary | ICD-10-CM

## 2011-05-11 DIAGNOSIS — R498 Other voice and resonance disorders: Secondary | ICD-10-CM

## 2011-05-11 DIAGNOSIS — G4730 Sleep apnea, unspecified: Secondary | ICD-10-CM

## 2011-05-11 MED ORDER — TIOTROPIUM BROMIDE MONOHYDRATE 18 MCG IN CAPS: 18.0000 ug | ORAL_CAPSULE | Freq: Every day | RESPIRATORY_TRACT | Status: DC

## 2011-05-11 MED ORDER — PROMETHAZINE-CODEINE 6.25-10 MG/5ML PO SYRP: 5.0000 mL | ORAL_SOLUTION | Freq: Four times a day (QID) | ORAL | Status: DC | PRN

## 2011-05-11 NOTE — Progress Notes (Signed)
Patient ID: Dominique Robbins, female    DOB: Oct 28, 1950, 60 y.o.   MRN: [id number]  HPI 10/16/10- 60 yo never smoker followed for asthma, allergic rhinitis, OSA, vocal cord paresis/ hoarseness. Last here June 02, 2010. PFT then reviewd FEV1 2.29/76% and IgE 86.3.  Since then no real change "pretty good" bothered by humidity and oak tree pollen. Some silent reflux- on Nexium but no aspirational choke while eating. Had failed Astelin in past but used some antihistamines. Uses Flonase  CPAP compliance all night every night 11 cwp.  Spiriva helps but doesn't last 12 hours.   05/11/11-  60 yo never smoker followed for asthma, allergic rhinitis, OSA, vocal cord paresis/ hoarseness. Has had flu vaccine. No major problems with her breathing since last here. Remains comfortable with CPAP 11 CWP. Nexium controls heartburn symptoms. This season is not associated with significant rhinitis complaints. There has been no change in chronic hoarseness after placement of vocal cord stent.  Review of Systems Constitutional:   No-   weight loss, night sweats, fevers, chills, fatigue, lassitude. HEENT:   No-  headaches, difficulty swallowing, tooth/dental problems, sore throat,       No-  sneezing, itching, ear ache, nasal congestion, post nasal drip,  CV:  No-   chest pain, orthopnea, PND, swelling in lower extremities, anasarca, dizziness, palpitations Resp: No-   shortness of breath with exertion or at rest.              No-   productive cough,  No non-productive cough,  No- coughing up of blood.              No-   change in color of mucus.  No- wheezing.   Skin: No-   rash or lesions. GI:  No-   heartburn, indigestion, abdominal pain, nausea, vomiting, diarrhea,                 change in bowel habits, loss of appetite GU MS:  Neuro-     nothing unusual Psych:  No- change in mood or affect. No depression or anxiety.  No memory loss.      Objective:   Physical Exam General- Alert, Oriented,  Affect-appropriate, Distress- none acute, obese Skin- rash-none, lesions- none, excoriation- none Lymphadenopathy- none Head- atraumatic            Eyes- Gross vision intact, PERRLA, conjunctivae clear secretions            Ears- Hearing, canals-normal            Nose- Clear, no-Septal dev, mucus, polyps, erosion, perforation             Throat- Mallampati II , mucosa clear , drainage- none, tonsils- atrophic, chronically hoarse Neck- flexible , trachea midline, no stridor , thyroid nl, carotid no bruit Chest - symmetrical excursion , unlabored           Heart/CV- RRR , no murmur , no gallop  , no rub, nl s1 s2                           - JVD- none , edema- none, stasis changes- none, varices- none           Lung- clear to P&A, wheeze- none, cough- none , dullness-none, rub- none           Chest wall-  Abd- Br/ Gen/ Rectal- Not done, not indicated Extrem- cyanosis- none, clubbing, none,  atrophy- none, strength- nl Neuro- grossly intact to observation

## 2011-05-11 NOTE — Patient Instructions (Signed)
Please call as needed  Scripts to refill Spiriva and cough syrup

## 2011-05-16 NOTE — Assessment & Plan Note (Signed)
She continues Pulmicort, Singulair, Spiriva, occasional use of Xopenex HFA. Control is good and she is satisfied.

## 2011-05-16 NOTE — Assessment & Plan Note (Signed)
Good compliance and control. We encourage weight loss.

## 2011-05-16 NOTE — Assessment & Plan Note (Signed)
Her voice seems the same. Work of breathing has not increased and there is no stridor.

## 2011-11-12 ENCOUNTER — Other Ambulatory Visit: Payer: Self-pay | Admitting: Internal Medicine

## 2011-11-13 NOTE — Telephone Encounter (Signed)
Ok to refill 

## 2011-11-13 NOTE — Telephone Encounter (Signed)
Ok to call in

## 2011-11-13 NOTE — Telephone Encounter (Signed)
Please advise if okay to refill. Thanks.  

## 2012-05-04 ENCOUNTER — Ambulatory Visit: Payer: BC Managed Care – PPO | Admitting: Internal Medicine

## 2012-05-06 ENCOUNTER — Ambulatory Visit (INDEPENDENT_AMBULATORY_CARE_PROVIDER_SITE_OTHER): Payer: BC Managed Care – PPO | Admitting: Internal Medicine

## 2012-05-06 ENCOUNTER — Encounter: Payer: Self-pay | Admitting: Internal Medicine

## 2012-05-06 VITALS — BP 140/70 | HR 86 | Ht 68.5 in | Wt 278.6 lb

## 2012-05-06 DIAGNOSIS — J309 Allergic rhinitis, unspecified: Secondary | ICD-10-CM

## 2012-05-06 DIAGNOSIS — J302 Other seasonal allergic rhinitis: Secondary | ICD-10-CM

## 2012-05-06 DIAGNOSIS — J45909 Unspecified asthma, uncomplicated: Secondary | ICD-10-CM

## 2012-05-06 DIAGNOSIS — J3089 Other allergic rhinitis: Secondary | ICD-10-CM

## 2012-05-06 DIAGNOSIS — G4733 Obstructive sleep apnea (adult) (pediatric): Secondary | ICD-10-CM

## 2012-05-06 MED ORDER — PROMETHAZINE-CODEINE 6.25-10 MG/5ML PO SYRP: 5.0000 mL | ORAL_SOLUTION | Freq: Four times a day (QID) | ORAL | Status: DC | PRN

## 2012-05-06 NOTE — Progress Notes (Signed)
Patient ID: Dominique Robbins, female    DOB: 1950/08/17, 61 y.o.   MRN: [id number]  HPI 10/16/10- 61 y.o never smoker followed for asthma, allergic rhinitis, OSA, vocal cord paresis/ hoarseness. Last here June 02, 2010. PFT then reviewd FEV1 2.29/76% and IgE 86.3.  Since then no real change "pretty good" bothered by humidity and oak tree pollen. Some silent reflux- on Nexium but no aspirational choke while eating. Had failed Astelin in past but used some antihistamines. Uses Flonase  CPAP compliance all night every night 11 cwp.  Spiriva helps but doesn't last 12 hours.   05/11/11-  89 y.o never smoker followed for asthma, allergic rhinitis, OSA, vocal cord paresis/ hoarseness. Has had flu vaccine. No major problems with her breathing since last here. Remains comfortable with CPAP 11 CWP. Nexium controls heartburn symptoms. This season is not associated with significant rhinitis complaints. There has been no change in chronic hoarseness after placement of vocal cord stent.   05/06/12- 89 y.o never smoker followed for asthma, allergic rhinitis, OSA, vocal cord paresis/ hoarseness. FOLLOWS FOR: wears CPAP 11/ [facility] every night for about 8 hours and pressure working well for patient;nasal and chest congestion, cough-unable to breathe when coughing that hard She blames weather change or a recent cold. Had flu vaccine. Residual dry cough without much wheezing.  Review of Systems- see HPI Constitutional:   No-   weight loss, night sweats, fevers, chills, fatigue, lassitude. HEENT:   No-  headaches, difficulty swallowing, tooth/dental problems, sore throat,       No-  sneezing, itching, ear ache, _nasal congestion, post nasal drip,  CV:  No-   chest pain, orthopnea, PND, swelling in lower extremities, anasarca, dizziness, palpitations Resp: No-   shortness of breath with exertion or at rest.              No-   productive cough,  + non-productive cough,  No- coughing up of blood.    No-   change in color of mucus.  No- wheezing.   Skin: No-   rash or lesions. GI:  No-   heartburn, indigestion, abdominal pain, nausea, vomiting,  GU MS:  Neuro-     nothing unusual Psych:  No- change in mood or affect. No depression or anxiety.  No memory loss.    Objective:   Physical Exam General- Alert, Oriented, Affect-appropriate, Distress- none acute, obese Skin- rash-none, lesions- none, excoriation- none Lymphadenopathy- none Head- atraumatic            Eyes- Gross vision intact, PERRLA, conjunctivae clear secretions            Ears- Hearing, canals-normal            Nose- Clear, no-Septal dev, mucus, polyps, erosion, perforation. +Sniffing loudly             Throat- Mallampati IV , mucosa clear , drainage- none, tonsils- atrophic, +chronically hoarse Neck- flexible , trachea midline, no stridor , thyroid nl, carotid no bruit Chest - symmetrical excursion , unlabored           Heart/CV- RRR , no murmur , no gallop  , no rub, nl s1 s2                           - JVD- none , edema- none, stasis changes- none, varices- none           Lung- clear to P&A, wheeze- none, cough- none ,  dullness-none, rub- none           Chest wall-  Abd- Br/ Gen/ Rectal- Not done, not indicated Extrem- cyanosis- none, clubbing, none, atrophy- none, strength- nl Neuro- grossly intact to observation

## 2012-05-06 NOTE — Patient Instructions (Addendum)
Script- cough syrup  We can continue CPAP 11/ [facility]  Please call as needed

## 2012-05-18 NOTE — Assessment & Plan Note (Signed)
Resolving viral pattern bronchitis. We discussed management and can let her keep some cough syrup on hand for this winter.

## 2012-05-18 NOTE — Assessment & Plan Note (Signed)
Plan-refill Nasonex.

## 2012-05-18 NOTE — Assessment & Plan Note (Signed)
Good compliance and control with CPAP. Weight loss would help.

## 2012-10-05 ENCOUNTER — Telehealth: Payer: Self-pay | Admitting: Internal Medicine

## 2012-10-05 MED ORDER — PROMETHAZINE-CODEINE 6.25-10 MG/5ML PO SYRP: 5.0000 mL | ORAL_SOLUTION | Freq: Four times a day (QID) | ORAL | Status: DC | PRN

## 2012-10-05 NOTE — Telephone Encounter (Signed)
rx has been called in. Pt aware

## 2012-10-05 NOTE — Telephone Encounter (Signed)
I spoke with pt and she is requesting a refill on promethazine w/ codeine cough syrup. Last refilled #180 x 1 refill 05/11/11. She stated she has developed a cough and this helps. Please advise Dr. Maple Hudson thanks Last OV 05/06/12 05/08/13 pending appt No Known Allergies

## 2012-10-05 NOTE — Telephone Encounter (Signed)
Per CY-okay to refill this time ONLY; Promethazine with codeine cough syrup #200 ml 1 tsp every 6 hours prn cough no refills.

## 2012-11-10 ENCOUNTER — Ambulatory Visit (INDEPENDENT_AMBULATORY_CARE_PROVIDER_SITE_OTHER): Payer: BC Managed Care – PPO | Admitting: Internal Medicine

## 2012-11-10 ENCOUNTER — Encounter: Payer: Self-pay | Admitting: Internal Medicine

## 2012-11-10 VITALS — BP 120/62 | HR 86 | Ht 66.5 in | Wt 286.6 lb

## 2012-11-10 DIAGNOSIS — J309 Allergic rhinitis, unspecified: Secondary | ICD-10-CM

## 2012-11-10 DIAGNOSIS — J3089 Other allergic rhinitis: Secondary | ICD-10-CM

## 2012-11-10 DIAGNOSIS — J302 Other seasonal allergic rhinitis: Secondary | ICD-10-CM

## 2012-11-10 DIAGNOSIS — J4540 Moderate persistent asthma, uncomplicated: Secondary | ICD-10-CM

## 2012-11-10 DIAGNOSIS — G4733 Obstructive sleep apnea (adult) (pediatric): Secondary | ICD-10-CM

## 2012-11-10 DIAGNOSIS — J45909 Unspecified asthma, uncomplicated: Secondary | ICD-10-CM

## 2012-11-10 MED ORDER — IPRATROPIUM BROMIDE 0.06 % NA SOLN: 2.0000 | Freq: Four times a day (QID) | NASAL | Status: DC

## 2012-11-10 MED ORDER — LEVALBUTEROL TARTRATE 45 MCG/ACT IN AERO: 1.0000 | INHALATION_SPRAY | Freq: Four times a day (QID) | RESPIRATORY_TRACT | Status: DC | PRN

## 2012-11-10 MED ORDER — BECLOMETHASONE DIPROPIONATE 80 MCG/ACT NA AERS: 1.0000 | INHALATION_SPRAY | Freq: Every day | NASAL | Status: DC

## 2012-11-10 MED ORDER — IPRATROPIUM BROMIDE HFA 17 MCG/ACT IN AERS: 2.0000 | INHALATION_SPRAY | Freq: Four times a day (QID) | RESPIRATORY_TRACT | Status: DC

## 2012-11-10 MED ORDER — ALBUTEROL SULFATE (2.5 MG/3ML) 0.083% IN NEBU: 2.5000 mg | INHALATION_SOLUTION | Freq: Four times a day (QID) | RESPIRATORY_TRACT | Status: DC | PRN

## 2012-11-10 MED ORDER — MOMETASONE FURO-FORMOTEROL FUM 200-5 MCG/ACT IN AERO: 2.0000 | INHALATION_SPRAY | Freq: Two times a day (BID) | RESPIRATORY_TRACT | Status: DC

## 2012-11-10 MED ORDER — PROMETHAZINE-CODEINE 6.25-10 MG/5ML PO SYRP: 5.0000 mL | ORAL_SOLUTION | Freq: Four times a day (QID) | ORAL | Status: DC | PRN

## 2012-11-10 NOTE — Patient Instructions (Addendum)
Scripts refilled  We are going to see how you do without Singulair, Spiriva, Astelin/ azelastine nasal spray  Script for cough syrup  We can continue CPAP 11/ [facility]  Please call as needed

## 2012-11-10 NOTE — Progress Notes (Signed)
Patient ID: Dominique Robbins, female    DOB: 1951/02/28, 62 y.o.   MRN: [id number]  HPI 10/16/10- 62 yo never smoker followed for asthma, allergic rhinitis, OSA, vocal cord paresis/ hoarseness. Last here June 02, 2010. PFT then reviewd FEV1 2.29/76% and IgE 86.3.  Since then no real change "pretty good" bothered by humidity and oak tree pollen. Some silent reflux- on Nexium but no aspirational choke while eating. Had failed Astelin in past but used some antihistamines. Uses Flonase  CPAP compliance all night every night 11 cwp.  Spiriva helps but doesn't last 12 hours.   05/11/11-  62 yo never smoker followed for asthma, allergic rhinitis, OSA, vocal cord paresis/ hoarseness. Has had flu vaccine. No major problems with her breathing since last here. Remains comfortable with CPAP 11 CWP. Nexium controls heartburn symptoms. This season is not associated with significant rhinitis complaints. There has been no change in chronic hoarseness after placement of vocal cord stent.   05/06/12- 62 yo never smoker followed for asthma, allergic rhinitis, OSA, vocal cord paresis/ hoarseness. FOLLOWS FOR: wears CPAP 11/ [facility] every night for about 8 hours and pressure working well for patient;nasal and chest congestion, cough-unable to breathe when coughing that hard She blames weather change or a recent cold. Had flu vaccine. Residual dry cough without much wheezing.  11/10/12- 62 yo never smoker followed for asthma, allergic rhinitis, OSA, vocal cord paresis/ hoarseness. FOLLOWS FOR: wears CPAP 11/ [facility] every night for about 8 hours and pressure doing well; not due for any supplies at this time Voice is doing better. Occasional cough and needs to keep cough syrup. Little wheeze. Less use of her nebulizer machine. We reviewed medications.  Review of Systems- see HPI Constitutional:   No-   weight loss, night sweats, fevers, chills, fatigue, lassitude. HEENT:   No-  headaches, difficulty swallowing,  tooth/dental problems, sore throat,       No-  sneezing, itching, ear ache, nasal congestion, post nasal drip,  CV:  No-   chest pain, orthopnea, PND, swelling in lower extremities, anasarca, dizziness, palpitations Resp: No-   shortness of breath with exertion or at rest.              No-   productive cough,  + non-productive cough,  No- coughing up of blood.              No-   change in color of mucus.  + wheezing.   Skin: No-   rash or lesions. GI:  No-   heartburn, indigestion, abdominal pain, nausea, vomiting,  GU MS:  Neuro-     nothing unusual Psych:  No- change in mood or affect. No depression or anxiety.  No memory loss.    Objective:   Physical Exam General- Alert, Oriented, Affect-appropriate, Distress- none acute, obese Skin- rash-none, lesions- none, excoriation- none Lymphadenopathy- none Head- atraumatic            Eyes- Gross vision intact, PERRLA, conjunctivae clear secretions            Ears- Hearing, canals-normal            Nose- Clear, no-Septal dev, mucus, polyps, erosion, perforation. +Sniffing loudly             Throat- Mallampati IV , mucosa clear , drainage- none, tonsils- atrophic, not hoarse Neck- flexible , trachea midline, no stridor , thyroid nl, carotid no bruit Chest - symmetrical excursion , unlabored  Heart/CV- RRR , no murmur , no gallop  , no rub, nl s1 s2                           - JVD- none , edema- none, stasis changes- none, varices- none           Lung- clear to P&A, wheeze- none, cough- none , dullness-none, rub- none           Chest wall-  Abd- Br/ Gen/ Rectal- Not done, not indicated Extrem- cyanosis- none, clubbing, none, atrophy- none, strength- nl. Cane Neuro- grossly intact to observation

## 2012-11-25 NOTE — Assessment & Plan Note (Addendum)
Continues Dulera, but can reduce medications.

## 2012-11-25 NOTE — Assessment & Plan Note (Signed)
Controlled better now.

## 2012-11-25 NOTE — Assessment & Plan Note (Signed)
Good compliance and control with no changes needed. Has not accomplished weight loss.

## 2012-12-22 ENCOUNTER — Telehealth: Payer: Self-pay | Admitting: Internal Medicine

## 2012-12-22 MED ORDER — ALBUTEROL SULFATE HFA 108 (90 BASE) MCG/ACT IN AERS: 1.0000 | INHALATION_SPRAY | Freq: Four times a day (QID) | RESPIRATORY_TRACT | Status: DC | PRN

## 2012-12-22 MED ORDER — FLUTICASONE PROPIONATE 50 MCG/ACT NA SUSP: 1.0000 | Freq: Every day | NASAL | Status: DC

## 2012-12-22 NOTE — Telephone Encounter (Signed)
Pt aware. New meds sent to [facility] and Flonase

## 2012-12-22 NOTE — Telephone Encounter (Signed)
Per CDY:  Pt would have to self pay for xopenex hfa. But if she will agree to accept albuterol hfa, we can send proair hfa rx.  Qnasl can be changed to flonase #1 1-2 puffs each nostril once daily at bedtime

## 2012-12-22 NOTE — Telephone Encounter (Signed)
Called and spoke with [facility]  [facility]  She stated that the following medications are not covered by the pts insurance  xopenex HFA  But alternatives are  Ventolin HFA or proair HFA.    The Qnasl is not covered and will need a PA for review. Alternatives for this will be:  Fluticatsone, flunisolide nasal spray, triamcinolone nasal.  Please advise if any of these alternatives can be sent in for the pt.  Thanks  Last ov:  11/10/12 Next ov:  05/08/2013   No Known Allergies

## 2012-12-22 NOTE — Telephone Encounter (Signed)
Called and lmomtcb for pt to review change in meds--to make sure pt is ok with the changes.

## 2013-02-28 ENCOUNTER — Other Ambulatory Visit: Payer: Self-pay | Admitting: Internal Medicine

## 2013-02-28 MED ORDER — PROMETHAZINE-CODEINE 6.25-10 MG/5ML PO SYRP: 5.0000 mL | ORAL_SOLUTION | Freq: Four times a day (QID) | ORAL | Status: DC | PRN

## 2013-02-28 NOTE — Telephone Encounter (Signed)
Called refill to pharmacy voicemail.  

## 2013-05-08 ENCOUNTER — Ambulatory Visit (INDEPENDENT_AMBULATORY_CARE_PROVIDER_SITE_OTHER): Payer: BC Managed Care – PPO | Admitting: Internal Medicine

## 2013-05-08 ENCOUNTER — Ambulatory Visit: Payer: BC Managed Care – PPO | Admitting: Internal Medicine

## 2013-05-08 ENCOUNTER — Encounter (INDEPENDENT_AMBULATORY_CARE_PROVIDER_SITE_OTHER): Payer: Self-pay

## 2013-05-08 ENCOUNTER — Encounter: Payer: Self-pay | Admitting: Internal Medicine

## 2013-05-08 VITALS — BP 130/60 | HR 82 | Ht 66.5 in | Wt 264.6 lb

## 2013-05-08 DIAGNOSIS — R498 Other voice and resonance disorders: Secondary | ICD-10-CM

## 2013-05-08 DIAGNOSIS — J45909 Unspecified asthma, uncomplicated: Secondary | ICD-10-CM

## 2013-05-08 DIAGNOSIS — G4733 Obstructive sleep apnea (adult) (pediatric): Secondary | ICD-10-CM

## 2013-05-08 MED ORDER — PROMETHAZINE-CODEINE 6.25-10 MG/5ML PO SYRP: 5.0000 mL | ORAL_SOLUTION | Freq: Four times a day (QID) | ORAL | Status: DC | PRN

## 2013-05-08 MED ORDER — TIOTROPIUM BROMIDE MONOHYDRATE 18 MCG IN CAPS: 18.0000 ug | ORAL_CAPSULE | Freq: Every day | RESPIRATORY_TRACT | Status: DC

## 2013-05-08 MED ORDER — MONTELUKAST SODIUM 10 MG PO TABS: 10.0000 mg | ORAL_TABLET | Freq: Every day | ORAL | Status: DC

## 2013-05-08 NOTE — Patient Instructions (Signed)
We are resuming Spiriva and Singulair  Script for cough syrup  We can continue CPAP 11/ [facility]  Please call as needed and have a very [employer]

## 2013-05-08 NOTE — Progress Notes (Signed)
Patient ID: Dominique Robbins, female    DOB: [birth year], 62 y.o.   MRN: [id number]  HPI 10/16/10- 51 yo never smoker followed for asthma, allergic rhinitis, OSA, vocal cord paresis/ hoarseness. Last here June 02, 2010. PFT then reviewd FEV1 2.29/76% and IgE 86.3.  Since then no real change "pretty good" bothered by humidity and oak tree pollen. Some silent reflux- on Nexium but no aspirational choke while eating. Had failed Astelin in past but used some antihistamines. Uses Flonase  CPAP compliance all night every night 11 cwp.  Spiriva helps but doesn't last 12 hours.   05/11/11-  53 yo never smoker followed for asthma, allergic rhinitis, OSA, vocal cord paresis/ hoarseness. Has had flu vaccine. No major problems with her breathing since last here. Remains comfortable with CPAP 11 CWP. Nexium controls heartburn symptoms. This season is not associated with significant rhinitis complaints. There has been no change in chronic hoarseness after placement of vocal cord stent.   05/06/12- 9 yo never smoker followed for asthma, allergic rhinitis, OSA, vocal cord paresis/ hoarseness. FOLLOWS FOR: wears CPAP 11/ [facility] every night for about 8 hours and pressure working well for patient;nasal and chest congestion, cough-unable to breathe when coughing that hard She blames weather change or a recent cold. Had flu vaccine. Residual dry cough without much wheezing.  11/10/12- 62 yo never smoker followed for asthma, allergic rhinitis, OSA, vocal cord paresis/ hoarseness. FOLLOWS FOR: wears CPAP 11/ [facility] every night for about 8 hours and pressure doing well; not due for any supplies at this time Voice is doing better. Occasional cough and needs to keep cough syrup. Little wheeze. Less use of her nebulizer machine. We reviewed medications.  05/08/13- 14 yo never smoker followed for asthma, allergic rhinitis, OSA, vocal cord paresis/ hoarseness. FOLLOWS FOR: DME is [facility]-Wears CPAP 11 every night for  at least 8 hours; pressure working well for patient. Breathing okay but needs refill of cough syrup. Wants to restart Spiriva and Singulair. Denies reflux or cough with meals. Bothersome cough and tightness.  Review of Systems- see HPI Constitutional:   No-   weight loss, night sweats, fevers, chills, fatigue, lassitude. HEENT:   No-  headaches, difficulty swallowing, tooth/dental problems, sore throat,       No-  sneezing, itching, ear ache, nasal congestion, post nasal drip,  CV:  No-   chest pain, orthopnea, PND, swelling in lower extremities, anasarca, dizziness, palpitations Resp: No-   shortness of breath with exertion or at rest.              No-   productive cough,  + non-productive cough,  No- coughing up of blood.              No-   change in color of mucus.  + wheezing.   Skin: No-   rash or lesions. GI:  No-   heartburn, indigestion, abdominal pain, nausea, vomiting,  GU MS:  Neuro-     nothing unusual Psych:  No- change in mood or affect. No depression or anxiety.  No memory loss.    Objective:   Physical Exam General- Alert, Oriented, Affect-appropriate, Distress- none acute, obese Skin- rash-none, lesions- none, excoriation- none Lymphadenopathy- none Head- atraumatic            Eyes- Gross vision intact, PERRLA, conjunctivae clear secretions            Ears- Hearing, canals-normal            Nose-  Clear, no-Septal dev, mucus, polyps, erosion, perforation.              Throat- Mallampati IV , mucosa clear , drainage- none, tonsils- atrophic, + hoarse Neck- flexible , trachea midline, no stridor , thyroid nl, carotid no bruit Chest - symmetrical excursion , unlabored           Heart/CV- RRR , no murmur , no gallop  , no rub, nl s1 s2                           - JVD- none , edema- none, stasis changes- none, varices- none           Lung- clear to P&A, wheeze- none, cough+slight dry , dullness-none, rub- none           Chest wall-  Abd- Br/ Gen/ Rectal- Not done, not  indicated Extrem- cyanosis- none, clubbing, none, atrophy- none, strength- nl. Cane Neuro- grossly intact to observation

## 2013-06-03 NOTE — Assessment & Plan Note (Signed)
Increased cough is aggravated her chronic vocal cord problems with hoarseness

## 2013-06-03 NOTE — Assessment & Plan Note (Signed)
Acute exacerbation Plan-codeine cough syrup, resume Spiriva and Singulair

## 2013-06-03 NOTE — Assessment & Plan Note (Signed)
Good compliance and control. Pressure is appropriate. 

## 2013-11-06 ENCOUNTER — Ambulatory Visit: Payer: BC Managed Care – PPO | Admitting: Internal Medicine

## 2013-12-05 ENCOUNTER — Encounter: Payer: Self-pay | Admitting: Internal Medicine

## 2013-12-05 ENCOUNTER — Ambulatory Visit (INDEPENDENT_AMBULATORY_CARE_PROVIDER_SITE_OTHER): Payer: BC Managed Care – PPO | Admitting: Internal Medicine

## 2013-12-05 VITALS — BP 122/70 | HR 75 | Ht 66.5 in | Wt 247.0 lb

## 2013-12-05 DIAGNOSIS — G4733 Obstructive sleep apnea (adult) (pediatric): Secondary | ICD-10-CM

## 2013-12-05 DIAGNOSIS — J3089 Other allergic rhinitis: Secondary | ICD-10-CM

## 2013-12-05 DIAGNOSIS — J309 Allergic rhinitis, unspecified: Secondary | ICD-10-CM

## 2013-12-05 DIAGNOSIS — J302 Other seasonal allergic rhinitis: Secondary | ICD-10-CM

## 2013-12-05 DIAGNOSIS — J383 Other diseases of vocal cords: Secondary | ICD-10-CM

## 2013-12-05 MED ORDER — PROMETHAZINE-CODEINE 6.25-10 MG/5ML PO SYRP: 5.0000 mL | ORAL_SOLUTION | Freq: Four times a day (QID) | ORAL | Status: DC | PRN

## 2013-12-05 NOTE — Progress Notes (Signed)
Patient ID: Dominique Robbins, female    DOB: 04/01/1951, 63 y.o.   MRN: [id number]  HPI 10/16/10- 63 yo never smoker followed for asthma, allergic rhinitis, OSA, vocal cord paresis/ hoarseness. Last here June 02, 2010. PFT then reviewd FEV1 2.29/76% and IgE 86.3.  Since then no real change "pretty good" bothered by humidity and oak tree pollen. Some silent reflux- on Nexium but no aspirational choke while eating. Had failed Astelin in past but used some antihistamines. Uses Flonase  CPAP compliance all night every night 11 cwp.  Spiriva helps but doesn't last 12 hours.   05/11/11-  63 yo never smoker followed for asthma, allergic rhinitis, OSA, vocal cord paresis/ hoarseness. Has had flu vaccine. No major problems with her breathing since last here. Remains comfortable with CPAP 11 CWP. Nexium controls heartburn symptoms. This season is not associated with significant rhinitis complaints. There has been no change in chronic hoarseness after placement of vocal cord stent.   05/06/12- 63 yo never smoker followed for asthma, allergic rhinitis, OSA, vocal cord paresis/ hoarseness. FOLLOWS FOR: wears CPAP 11/ [facility] every night for about 8 hours and pressure working well for patient;nasal and chest congestion, cough-unable to breathe when coughing that hard She blames weather change or a recent cold. Had flu vaccine. Residual dry cough without much wheezing.  11/10/12- 63 yo never smoker followed for asthma, allergic rhinitis, OSA, vocal cord paresis/ hoarseness. FOLLOWS FOR: wears CPAP 11/ [facility] every night for about 8 hours and pressure doing well; not due for any supplies at this time Voice is doing better. Occasional cough and needs to keep cough syrup. Little wheeze. Less use of her nebulizer machine. We reviewed medications.  05/08/13- 63 yo never smoker followed for asthma, allergic rhinitis, OSA, vocal cord paresis/ hoarseness. FOLLOWS FOR: DME is [facility]-Wears CPAP 11 every night for  at least 8 hours; pressure working well for patient. Breathing okay but needs refill of cough syrup. Wants to restart Spiriva and Singulair. Denies reflux or cough with meals. Bothersome cough and tightness.  12/05/13- 63 yo never smoker followed for asthma, allergic rhinitis,chronic cough,  OSA, vocal cord paresis/ hoarseness. FOLLOW FOR:  Wearing CPAP 6-8 hours per night.  Still having non-productive and request rx for cough syrup Saw no change after stopping Singulair. Continue Spiriva. Chronic cough and hoarseness from vocal cords CPAP is helpful and used all night every night.  Review of Systems- see HPI Constitutional:   No-   weight loss, night sweats, fevers, chills, fatigue, lassitude. HEENT:   No-  headaches, difficulty swallowing, tooth/dental problems, sore throat,       No-  sneezing, itching, ear ache, nasal congestion, post nasal drip,  CV:  No-   chest pain, orthopnea, PND, swelling in lower extremities, anasarca, dizziness, palpitations Resp: No-   shortness of breath with exertion or at rest.              No-   productive cough,  + non-productive cough,  No- coughing up of blood.              No-   change in color of mucus.  + wheezing.   Skin: No-   rash or lesions. GI:  No-   heartburn, indigestion, abdominal pain, nausea, vomiting,  GU MS:  Neuro-     nothing unusual Psych:  No- change in mood or affect. No depression or anxiety.  No memory loss.    Objective:   Physical Exam General- Alert,  Oriented, Affect-appropriate, Distress- none acute, obese Skin- rash-none, lesions- none, excoriation- none Lymphadenopathy- none Head- atraumatic            Eyes- Gross vision intact, PERRLA, conjunctivae clear secretions            Ears- Hearing, canals-normal            Nose- Clear, no-Septal dev, mucus, polyps, erosion, perforation.              Throat- Mallampati IV , mucosa clear , drainage- none, tonsils- atrophic, + hoarse,                             +Throat  clearing Neck- flexible , trachea midline, no stridor , thyroid nl, carotid no bruit Chest - symmetrical excursion , unlabored           Heart/CV- RRR , no murmur , no gallop  , no rub, nl s1 s2                           - JVD- none , edema- none, stasis changes- none, varices- none           Lung- clear to P&A, wheeze- none, cough+ dry , dullness-none, rub- none           Chest wall-  Abd- Br/ Gen/ Rectal- Not done, not indicated Extrem- cyanosis- none, clubbing, none, atrophy- none, strength- nl. +Cane Neuro- grossly intact to observation

## 2013-12-05 NOTE — Patient Instructions (Signed)
We can continue CPAP 11/[facility]  Refill script for cough syrup  Please call as needed

## 2014-01-31 ENCOUNTER — Telehealth: Payer: Self-pay | Admitting: Internal Medicine

## 2014-01-31 NOTE — Telephone Encounter (Signed)
Spoke with the pt and she will bring the disc for CDY to review  Nothing further needed

## 2014-01-31 NOTE — Telephone Encounter (Signed)
Called spoke with pt. She reports she had CT scan done in [place]. She is going to get a disk and drop off for CDY to review. She will call and let us know once this is done. FYI for CDY

## 2014-02-02 ENCOUNTER — Telehealth: Payer: Self-pay | Admitting: Internal Medicine

## 2014-02-02 DIAGNOSIS — R911 Solitary pulmonary nodule: Secondary | ICD-10-CM

## 2014-02-02 NOTE — Telephone Encounter (Signed)
CY does have disk but has been unable to view this yet. Called spoke with patient to discuss.  She is aware we will call with CY's recommendations once available.  Dr Maple Hudson please advise, thank you.

## 2014-02-05 NOTE — Telephone Encounter (Signed)
I have reviewed the disk of her abdominal CT scan. There are a couple of small nodules that are probably benign. There is an area at the bottom of the left lung that looks like chronic inflammation. I would like to look at everything again, sooner than the radiologist suggested.  Recommend a  CT Lung with contrast in 4 months, with order for BMET   Dx lung nodules  She can do this at [facility], where she got the CT abd, for comparison. Or we can do it here and she can take her disk from the CT abd with her, so they scan it in for comparison.

## 2014-02-06 ENCOUNTER — Telehealth: Payer: Self-pay | Admitting: Internal Medicine

## 2014-02-06 NOTE — Telephone Encounter (Signed)
Called pt. She is wanting her CT scna to be schedule at the end of December. Order is in EPIC. Please advise thanks

## 2014-02-06 NOTE — Telephone Encounter (Signed)
Called spoke w/ pt. She is aware of results. She would like to have CT scan done here. Nothing further needed

## 2014-02-07 ENCOUNTER — Other Ambulatory Visit: Payer: Self-pay | Admitting: Internal Medicine

## 2014-02-07 DIAGNOSIS — R911 Solitary pulmonary nodule: Secondary | ICD-10-CM

## 2014-02-07 NOTE — Telephone Encounter (Signed)
Spoke with patient & she is aware of appt scheduled 05/14/14 arrival :15 am & to have labs drawn week prior to CT [employer]

## 2014-02-07 NOTE — Telephone Encounter (Signed)
Spoke with Katie & Dr. Maple Hudson is okay with pt's request to have this CT performed at the end of December.  LMTCB on pt's cell, need to get info so CT can be scheduled Lucilla Edin

## 2014-03-06 ENCOUNTER — Telehealth: Payer: Self-pay | Admitting: Internal Medicine

## 2014-03-06 MED ORDER — PROMETHAZINE-CODEINE 6.25-10 MG/5ML PO SYRP: 5.0000 mL | ORAL_SOLUTION | Freq: Four times a day (QID) | ORAL | Status: DC | PRN

## 2014-03-06 NOTE — Telephone Encounter (Signed)
Spoke with pt. She is requesting a refill on promethazine w/ codeine cough syrup. Last refilled 12/05/13 #240 ml 5 ml's po q6hrs prn Last OV 12/05/13 Pending 06/07/14 Please advise Dr. Maple HudsonYoung thanks

## 2014-03-06 NOTE — Telephone Encounter (Signed)
Called and spoke to pt. Informed pt of the refill. Rx called into preferred pharmacy. Pt verbalized understanding and denied any further questions or concerns at this time.   

## 2014-03-06 NOTE — Telephone Encounter (Signed)
Ok to refill 

## 2014-03-18 NOTE — Assessment & Plan Note (Signed)
Weight loss would help substantially

## 2014-03-18 NOTE — Assessment & Plan Note (Signed)
Discussed nasal steroid sprays

## 2014-03-18 NOTE — Assessment & Plan Note (Signed)
Good compliance and control with no change needed

## 2014-03-18 NOTE — Assessment & Plan Note (Signed)
Idiopathic paresis stented. Chronic cough

## 2014-04-02 ENCOUNTER — Telehealth: Payer: Self-pay | Admitting: Cardiology

## 2014-04-02 NOTE — Telephone Encounter (Signed)
Received [facility] for appointment with Dr Antoine PocheHochrein on 04/06/14.  [facility] for Dr Hochrein's schedule on 04/06/14.  lp

## 2014-04-03 ENCOUNTER — Encounter: Payer: Self-pay | Admitting: Internal Medicine

## 2014-04-05 ENCOUNTER — Ambulatory Visit (INDEPENDENT_AMBULATORY_CARE_PROVIDER_SITE_OTHER): Payer: BC Managed Care – PPO | Admitting: Cardiology

## 2014-04-05 ENCOUNTER — Encounter: Payer: Self-pay | Admitting: Cardiology

## 2014-04-05 VITALS — BP 112/62 | HR 65 | Ht 68.0 in | Wt 267.3 lb

## 2014-04-05 DIAGNOSIS — Z01810 Encounter for preprocedural cardiovascular examination: Secondary | ICD-10-CM | POA: Insufficient documentation

## 2014-04-05 NOTE — Progress Notes (Signed)
HPI The patient presents for preoperative evaluation. She has no past cardiac history though she does have significant risk factors. She is going for left total knee replacement. She was seen here once in 2011 for evaluation of dizziness. I was able to review these records. Her stress perfusion study done to evaluate the dizziness demonstrated an EF of 65% with normal perfusion. The patient had also echocardiogram which demonstrated probably some mild LVH and normal LV function although the windows were suboptimal. Despite her knee problems she can walk routinely 30 yards to her mailbox. She does her housework.  She denies any new cardiovascular symptoms. She will occasionally get some chest discomfort but this has been related to asthma and has been a stable chronic problem. She does not do this routinely. She has some mild dyspnea with exertion which is unchanged from previous. She has no palpitations, presyncope or syncope. PND or orthopnea. He has had no edema. She does have sleep apnea and sleeps with CPAP.   No Known Allergies  Current Outpatient Prescriptions  Medication Sig Dispense Refill  . albuterol (PROAIR HFA) 108 (90 BASE) MCG/ACT inhaler Inhale 1-2 puffs into the lungs every 6 (six) hours as needed for wheezing or shortness of breath. 1 Inhaler 3  . albuterol (PROVENTIL) (2.5 MG/3ML) 0.083% nebulizer solution Take 3 mLs (2.5 mg total) by nebulization every 6 (six) hours as needed for wheezing or shortness of breath. 75 mL prn  . azelastine (ASTELIN) 137 MCG/SPRAY nasal spray Place 1 spray into the nose 2 (two) times daily. Use in each nostril as directed    . Beclomethasone Dipropionate (QNASL) 80 MCG/ACT AERS Place 1 spray into the nose daily. 1 Inhaler prn  . busPIRone (BUSPAR) 15 MG tablet Take 1 tablet by mouth Twice daily.    . carbamazepine (TEGRETOL) 200 MG tablet Take 200 mg by mouth 2 (two) times daily.    . CRESTOR 20 MG tablet Take 20 mg by mouth daily.     .  cyanocobalamin (,VITAMIN B-12,) 1000 MCG/ML injection Inject 1,000 mcg into the muscle once.    . diazepam (VALIUM) 5 MG tablet Take 5 mg by mouth at bedtime as needed.      . fluticasone (FLONASE) 50 MCG/ACT nasal spray Place 1-2 sprays into the nose at bedtime. 16 g 6  . gabapentin (NEURONTIN) 300 MG capsule Take 600 mg by mouth 2 (two) times daily.     Marland Kitchen. glimepiride (AMARYL) 2 MG tablet Take 2 mg by mouth daily before breakfast.    . HYDROcodone-acetaminophen (NORCO) 10-325 MG per tablet Take 1 tablet by mouth every 4 (four) hours as needed for pain.    Marland Kitchen. ipratropium (ATROVENT HFA) 17 MCG/ACT inhaler Inhale 2 puffs into the lungs 4 (four) times daily. As needed for wheeze, cough, chest tightness 1 Inhaler prn  . ipratropium (ATROVENT) 0.06 % nasal spray Place 2 sprays into the nose 4 (four) times daily. If needed for drainage 15 mL prn  . levalbuterol (XOPENEX HFA) 45 MCG/ACT inhaler Inhale 1-2 puffs into the lungs every 6 (six) hours as needed for wheezing or shortness of breath. 1 Inhaler prn  . loratadine (CLARITIN) 10 MG tablet Take 10 mg by mouth daily.      Marland Kitchen. losartan-hydrochlorothiazide (HYZAAR) 50-12.5 MG per tablet Take 1 tablet by mouth daily.     . meloxicam (MOBIC) 15 MG tablet Take 15 mg by mouth daily.    . metaxalone (SKELAXIN) 800 MG tablet Take 800 mg by mouth  4 (four) times daily.      . metFORMIN (GLUCOPHAGE) 500 MG tablet Take 1,000 mg by mouth 2 (two) times daily with a meal.     . mometasone-formoterol (DULERA) 200-5 MCG/ACT AERO Inhale 2 puffs into the lungs 2 (two) times daily. 1 Inhaler prn  . montelukast (SINGULAIR) 10 MG tablet Take 1 tablet (10 mg total) by mouth daily. 90 tablet 3  . NEXIUM 40 MG capsule Take 1 capsule by mouth daily.    . promethazine-codeine (PHENERGAN WITH CODEINE) 6.25-10 MG/5ML syrup Take 5 mLs by mouth every 6 (six) hours as needed for cough. 240 mL 0  . sertraline (ZOLOFT) 100 MG tablet Take 100 mg by mouth daily.      Marland Kitchen tiotropium (SPIRIVA)  18 MCG inhalation capsule Place 1 capsule (18 mcg total) into inhaler and inhale daily. 90 capsule 3  . Vitamin D, Ergocalciferol, (DRISDOL) 50000 UNITS CAPS Take 1 capsule by mouth once a week.     No current facility-administered medications for this visit.    Past Medical History  Diagnosis Date  . OSA on CPAP   . Allergic rhinitis, cause unspecified   . Vocal cord paralysis   . Bronchitis, asthmatic     Past Surgical History  Procedure Laterality Date  . Appendectomy    . Cholecystectomy    . Rotator cuff repair    . Knee arthroscopy    . Vocal cord stent      Family History  Problem Relation Age of Onset  . Heart disease Mother   . Heart disease Father   . Diabetes Mother     History   Social History  . Marital Status: Married    Spouse Name: N/A    Number of Children: N/A  . Years of Education: N/A   Occupational History  . Not on file.   Social History Main Topics  . Smoking status: Never Smoker   . Smokeless tobacco: Not on file  . Alcohol Use: Not on file  . Drug Use: Not on file  . Sexual Activity: Not on file   Other Topics Concern  . Not on file   Social History Narrative    ROS:  Positive for occasional dizziness, constipation, leg swelling. Otherwise as stated in the HPI and negative for all other systems.  PHYSICAL EXAM BP 112/62 mmHg  Pulse 65  Ht 5\' 8"  (1.727 m)  Wt 267 lb 4.8 oz (121.246 kg)  BMI 40.65 kg/m2  GENERAL:  Well appearing HEENT:  Pupils equal round and reactive, fundi not visualized, oral mucosa unremarkable NECK:  No jugular venous distention, waveform within normal limits, carotid upstroke brisk and symmetric, no bruits, no thyromegaly LYMPHATICS:  No cervical, inguinal adenopathy LUNGS:  Clear to auscultation bilaterally BACK:  No CVA tenderness CHEST:  Unremarkable HEART:  PMI not displaced or sustained,S1 and S2 within normal limits, no S3, no S4, no clicks, no rubs, no murmurs ABD:  Flat, positive bowel sounds  normal in frequency in pitch, no bruits, no rebound, no guarding, no midline pulsatile mass, no hepatomegaly, no splenomegaly EXT:  2 plus pulses throughout, no edema, no cyanosis no clubbing SKIN:  No rashes no nodules NEURO:  Cranial nerves II through XII grossly intact, motor grossly intact throughout PSYCH:  Cognitively intact, oriented to person place and time  EKG:  Sinus rhythm, rate 65, axis within normal limits, intervals within normal limits, no acute ST-T wave changes.  04/05/2014   ASSESSMENT AND PLAN  PREOP:  The patient has no high-risk findings. She has had no new symptoms since her negative stress test in 2011. She's not going for a high risk procedure. She has a moderate functional level (greater than 5 METS). Therefore, based on ACC/AHA guidelines, the patient would be at acceptable risk for the planned procedure without further cardiovascular testing.  DIZZINESS:   This has been a chronic problem workup previously. No further cardiovascular testing is suggested.  OBESITY:  The patient understands the need to lose weight with diet and exercise. We have discussed specific strategies for this.

## 2014-04-05 NOTE — Patient Instructions (Signed)
Your physician recommends that you schedule a follow-up appointment in: as needed with Dr. Hochrein  

## 2014-04-06 ENCOUNTER — Ambulatory Visit: Payer: BC Managed Care – PPO | Admitting: Cardiology

## 2014-04-10 NOTE — Patient Instructions (Addendum)
20 Dominique Robbins  04/10/2014   Your procedure is scheduled on: Monday 04/23/14  Report to [facility] at 07:00 AM.  Call this number if you have problems the morning of surgery [phone]   Remember: please bring inhaler on day of surgery. Please bring CPAP mask and tubing on day of surgery. Do NOT take diabetic medicine on day of surgery.   Do not eat food or drink liquids After Midnight.     Take these medicines the morning of surgery with A SIP OF WATER: inhalers if needed, nasal spray if needed, buspirone, carbamazepine, crestor, gabapentin, claritin, montelukast, nexium, zoloft   Do not wear jewelry, make-up or nail polish.  Do not wear lotions, powders, or perfumes.   Do not shave 48 hours prior to surgery. Men may shave face and neck.  Do not bring valuables to the hospital.  Contacts, dentures or bridgework may not be worn into surgery.  Leave suitcase in the car. After surgery it may be brought to your room.  For patients admitted to the hospital, checkout time is 11:00 AM the day of discharge.     Please read over the following fact sheets that you were given:MRSA Information   [place] - Preparing for Surgery Before surgery, you can play an important role.  Because skin is not sterile, your skin needs to be as free of germs as possible.  You can reduce the number of germs on your skin by washing with CHG (chlorahexidine gluconate) soap before surgery.  CHG is an antiseptic cleaner which kills germs and bonds with the skin to continue killing germs even after washing. Please DO NOT use if you have an allergy to CHG or antibacterial soaps.  If your skin becomes reddened/irritated stop using the CHG and inform your nurse when you arrive at Short Stay. Do not shave (including legs and underarms) for at least 48 hours prior to the first CHG shower.  You may shave your face/neck. Please follow these instructions carefully:  1.  Shower with CHG Soap the  night before surgery and the  morning of Surgery.  2.  If you choose to wash your hair, wash your hair first as usual with your  normal  shampoo.  3.  After you shampoo, rinse your hair and body thoroughly to remove the  shampoo.                            4.  Use CHG as you would any other liquid soap.  You can apply chg directly  to the skin and wash                       Gently with a scrungie or clean washcloth.  5.  Apply the CHG Soap to your body ONLY FROM THE NECK DOWN.   Do not use on face/ open                           Wound or open sores. Avoid contact with eyes, ears mouth and genitals (private parts).                       Wash face,  Genitals (private parts) with your normal soap.             6.  Wash thoroughly, paying special attention to the  area where your surgery  will be performed.  7.  Thoroughly rinse your body with warm water from the neck down.  8.  DO NOT shower/wash with your normal soap after using and rinsing off  the CHG Soap.                9.  Pat yourself dry with a clean towel.            10.  Wear clean pajamas.            11.  Place clean sheets on your bed the night of your first shower and do not  sleep with pets. Day of Surgery : Do not apply any lotions/deodorants the morning of surgery.  Please wear clean clothes to the hospital/surgery center.  FAILURE TO FOLLOW THESE INSTRUCTIONS MAY RESULT IN THE CANCELLATION OF YOUR SURGERY PATIENT SIGNATURE_________________________________  NURSE SIGNATURE__________________________________  ________________________________________________________________________   Rogelia MireIncentive Spirometer  An incentive spirometer is a tool that can help keep your lungs clear and active. This tool measures how well you are filling your lungs with each breath. Taking long deep breaths may help reverse or decrease the chance of developing breathing (pulmonary) problems (especially infection) following:  A long period of time when you  are unable to move or be active. BEFORE THE PROCEDURE   If the spirometer includes an indicator to show your best effort, your nurse or respiratory therapist will set it to a desired goal.  If possible, sit up straight or lean slightly forward. Try not to slouch.  Hold the incentive spirometer in an upright position. INSTRUCTIONS FOR USE   Sit on the edge of your bed if possible, or sit up as far as you can in bed or on a chair.  Hold the incentive spirometer in an upright position.  Breathe out normally.  Place the mouthpiece in your mouth and seal your lips tightly around it.  Breathe in slowly and as deeply as possible, raising the piston or the ball toward the top of the column.  Hold your breath for 3-5 seconds or for as long as possible. Allow the piston or ball to fall to the bottom of the column.  Remove the mouthpiece from your mouth and breathe out normally.  Rest for a few seconds and repeat Steps 1 through 7 at least 10 times every 1-2 hours when you are awake. Take your time and take a few normal breaths between deep breaths.  The spirometer may include an indicator to show your best effort. Use the indicator as a goal to work toward during each repetition.  After each set of 10 deep breaths, practice coughing to be sure your lungs are clear. If you have an incision (the cut made at the time of surgery), support your incision when coughing by placing a pillow or rolled up towels firmly against it. Once you are able to get out of bed, walk around indoors and cough well. You may stop using the incentive spirometer when instructed by your caregiver.  RISKS AND COMPLICATIONS  Take your time so you do not get dizzy or light-headed.  If you are in pain, you may need to take or ask for pain medication before doing incentive spirometry. It is harder to take a deep breath if you are having pain. AFTER USE  Rest and breathe slowly and easily.  It can be helpful to keep track  of a log of your progress. Your caregiver can provide you with a  simple table to help with this. If you are using the spirometer at home, follow these instructions: SEEK MEDICAL CARE IF:   You are having difficultly using the spirometer.  You have trouble using the spirometer as often as instructed.  Your pain medication is not giving enough relief while using the spirometer.  You develop fever of 100.5 F (38.1 C) or higher. SEEK IMMEDIATE MEDICAL CARE IF:   You cough up bloody sputum that had not been present before.  You develop fever of 102 F (38.9 C) or greater.  You develop worsening pain at or near the incision site. MAKE SURE YOU:   Understand these instructions.  Will watch your condition.  Will get help right away if you are not doing well or get worse. Document Released: 09/14/2006 Document Revised: 07/27/2011 Document Reviewed: 11/15/2006 ExitCare Patient Information 2014 ExitCare, [place].   ________________________________________________________________________  WHAT IS A BLOOD TRANSFUSION? Blood Transfusion Information  A transfusion is the replacement of blood or some of its parts. Blood is made up of multiple cells which provide different functions.  Red blood cells carry oxygen and are used for blood loss replacement.  White blood cells fight against infection.  Platelets control bleeding.  Plasma helps clot blood.  Other blood products are available for specialized needs, such as hemophilia or other clotting disorders. BEFORE THE TRANSFUSION  Who gives blood for transfusions?   Healthy volunteers who are fully evaluated to make sure their blood is safe. This is blood bank blood. Transfusion therapy is the safest it has ever been in the practice of medicine. Before blood is taken from a donor, a complete history is taken to make sure that person has no history of diseases nor engages in risky social behavior (examples are intravenous drug use or  sexual activity with multiple partners). The donor's travel history is screened to minimize risk of transmitting infections, such as malaria. The donated blood is tested for signs of infectious diseases, such as HIV and hepatitis. The blood is then tested to be sure it is compatible with you in order to minimize the chance of a transfusion reaction. If you or a relative donates blood, this is often done in anticipation of surgery and is not appropriate for emergency situations. It takes many days to process the donated blood. RISKS AND COMPLICATIONS Although transfusion therapy is very safe and saves many lives, the main dangers of transfusion include:   Getting an infectious disease.  Developing a transfusion reaction. This is an allergic reaction to something in the blood you were given. Every precaution is taken to prevent this. The decision to have a blood transfusion has been considered carefully by your caregiver before blood is given. Blood is not given unless the benefits outweigh the risks. AFTER THE TRANSFUSION  Right after receiving a blood transfusion, you will usually feel much better and more energetic. This is especially true if your red blood cells have gotten low (anemic). The transfusion raises the level of the red blood cells which carry oxygen, and this usually causes an energy increase.  The nurse administering the transfusion will monitor you carefully for complications. HOME CARE INSTRUCTIONS  No special instructions are needed after a transfusion. You may find your energy is better. Speak with your caregiver about any limitations on activity for underlying diseases you may have. SEEK MEDICAL CARE IF:   Your condition is not improving after your transfusion.  You develop redness or irritation at the intravenous (IV) site. SEEK IMMEDIATE MEDICAL  CARE IF:  Any of the following symptoms occur over the next 12 hours:  Shaking chills.  You have a temperature by mouth above  102 F (38.9 C), not controlled by medicine.  Chest, back, or muscle pain.  People around you feel you are not acting correctly or are confused.  Shortness of breath or difficulty breathing.  Dizziness and fainting.  You get a rash or develop hives.  You have a decrease in urine output.  Your urine turns a dark color or changes to pink, red, or brown. Any of the following symptoms occur over the next 10 days:  You have a temperature by mouth above 102 F (38.9 C), not controlled by medicine.  Shortness of breath.  Weakness after normal activity.  The white part of the eye turns yellow (jaundice).  You have a decrease in the amount of urine or are urinating less often.  Your urine turns a dark color or changes to pink, red, or brown. Document Released: 05/01/2000 Document Revised: 07/27/2011 Document Reviewed: 12/19/2007 [facility] Patient Information 2014 [place], [place].  _______________________________________________________________________

## 2014-04-10 NOTE — Progress Notes (Signed)
LOV note 03/26/14 Dr. Record on chart, EKG 03/26/14 on chart

## 2014-04-11 ENCOUNTER — Encounter (HOSPITAL_COMMUNITY)
Admission: RE | Admit: 2014-04-11 | Discharge: 2014-04-11 | Disposition: A | Discharge status: Home / Self Care | Payer: BC Managed Care – PPO | Source: Ambulatory Visit | Attending: Orthopedic Surgery | Admitting: Orthopedic Surgery

## 2014-04-11 ENCOUNTER — Encounter (HOSPITAL_COMMUNITY): Payer: Self-pay

## 2014-04-11 ENCOUNTER — Ambulatory Visit (HOSPITAL_COMMUNITY)
Admission: RE | Admit: 2014-04-11 | Discharge: 2014-04-11 | Disposition: A | Discharge status: Home / Self Care | Payer: BC Managed Care – PPO | Source: Ambulatory Visit | Attending: Anesthesiology | Admitting: Anesthesiology

## 2014-04-11 DIAGNOSIS — I10 Essential (primary) hypertension: Secondary | ICD-10-CM

## 2014-04-11 DIAGNOSIS — I517 Cardiomegaly: Secondary | ICD-10-CM | POA: Insufficient documentation

## 2014-04-11 DIAGNOSIS — J449 Chronic obstructive pulmonary disease, unspecified: Secondary | ICD-10-CM | POA: Diagnosis not present

## 2014-04-11 DIAGNOSIS — Z01810 Encounter for preprocedural cardiovascular examination: Secondary | ICD-10-CM | POA: Insufficient documentation

## 2014-04-11 DIAGNOSIS — J45909 Unspecified asthma, uncomplicated: Secondary | ICD-10-CM | POA: Insufficient documentation

## 2014-04-11 HISTORY — DX: Unspecified osteoarthritis, unspecified site: M19.90

## 2014-04-11 HISTORY — DX: Pneumonia, unspecified organism: J18.9

## 2014-04-11 HISTORY — DX: Gastro-esophageal reflux disease without esophagitis: K21.9

## 2014-04-11 HISTORY — DX: Headache, unspecified: R51.9

## 2014-04-11 HISTORY — DX: Headache: R51

## 2014-04-11 HISTORY — DX: Unspecified chronic bronchitis: J42

## 2014-04-11 HISTORY — DX: Chronic obstructive pulmonary disease, unspecified: J44.9

## 2014-04-11 LAB — URINALYSIS, ROUTINE W REFLEX MICROSCOPIC
Bilirubin Urine: NEGATIVE
Glucose, UA: NEGATIVE mg/dL
Hgb urine dipstick: NEGATIVE
Ketones, ur: NEGATIVE mg/dL
Nitrite: NEGATIVE
Protein, ur: NEGATIVE mg/dL
Specific Gravity, Urine: 1.026 (ref 1.005–1.030)
Urobilinogen, UA: 0.2 mg/dL (ref 0.0–1.0)
pH: 5.5 (ref 5.0–8.0)

## 2014-04-11 LAB — CBC
HCT: 37.2 % (ref 36.0–46.0)
Hemoglobin: 11.8 g/dL — ABNORMAL LOW (ref 12.0–15.0)
MCH: 27.6 pg (ref 26.0–34.0)
MCHC: 31.7 g/dL (ref 30.0–36.0)
MCV: 86.9 fL (ref 78.0–100.0)
PLATELETS: 219 10*3/uL (ref 150–400)
RBC: 4.28 MIL/uL (ref 3.87–5.11)
RDW: 14.4 % (ref 11.5–15.5)
WBC: 9.7 10*3/uL (ref 4.0–10.5)

## 2014-04-11 LAB — URINE MICROSCOPIC-ADD ON

## 2014-04-11 LAB — BASIC METABOLIC PANEL
ANION GAP: 14 (ref 5–15)
BUN: 22 mg/dL (ref 6–23)
CO2: 27 mEq/L (ref 19–32)
Calcium: 10 mg/dL (ref 8.4–10.5)
Chloride: 100 mEq/L (ref 96–112)
Creatinine, Ser: 0.81 mg/dL (ref 0.50–1.10)
GFR calc Af Amer: 87 mL/min — ABNORMAL LOW (ref >90)
GFR, EST NON AFRICAN AMERICAN: 75 mL/min — AB (ref >90)
GLUCOSE: 118 mg/dL — AB (ref 70–99)
POTASSIUM: 4.2 meq/L (ref 3.7–5.3)
SODIUM: 141 meq/L (ref 137–147)

## 2014-04-11 LAB — APTT: aPTT: 25 seconds (ref 24–37)

## 2014-04-11 LAB — SURGICAL PCR SCREEN
MRSA, PCR: NEGATIVE
Staphylococcus aureus: NEGATIVE

## 2014-04-11 LAB — PROTIME-INR
INR: 1 (ref 0.00–1.49)
PROTHROMBIN TIME: 13.3 s (ref 11.6–15.2)

## 2014-04-11 NOTE — Progress Notes (Signed)
U/A with micro results done 04/11/14   faxed via EPIC to Dr Charlann Boxerlin.

## 2014-04-19 NOTE — Progress Notes (Signed)
04-19-14 1000 note LOV notes in Epic 04-05-14(Dr. Rollene RotundaJames Hochrein) ,giving pt cardiac clearance for surgery.

## 2014-04-20 ENCOUNTER — Telehealth: Payer: Self-pay | Admitting: Cardiology

## 2014-04-20 NOTE — Telephone Encounter (Signed)
Left message for pt confirming fax was sent to orthopedic office and received.

## 2014-04-20 NOTE — Telephone Encounter (Signed)
Notes faxed.

## 2014-04-20 NOTE — Telephone Encounter (Signed)
Pt is scheduled for surgery on Monday. Dr Charlann Boxerlin must have her clarence before 1 today,if not surgery will be cancelled.Please call her as soon as you do this.

## 2014-04-20 NOTE — Telephone Encounter (Signed)
Called to verify receipt of last VM, no answer.

## 2014-04-20 NOTE — Telephone Encounter (Signed)
Pt call in on status of surgical clearance that has been followed up on this AM by Deliah Goodyebra Mathis. Forms were sent. Called Sherry at [facility], she confirmed that she received the fax and will contact pt.

## 2014-04-20 NOTE — Telephone Encounter (Signed)
Spoke with Dominique Robbins, aware faxing the office note containing the clearance to the number provided.

## 2014-04-20 NOTE — Telephone Encounter (Signed)
Dominique Robbins is calling because she has faxed over a surgical Clearance for this patient whose surgery is schedule for 04/23/14/. Dominique Robbins came in on 04/05/14 and saw Dr. Antoine PocheHochrein . Please call Dominique Robbins to let her know if Dr. Bobbye CharlestonHochrein Cleared Dominique Robbins in his notes . [phone]. [phone].  Thanks

## 2014-04-21 NOTE — Anesthesia Preprocedure Evaluation (Addendum)
Anesthesia Evaluation  Patient identified by MRN, date of birth, ID band Patient awake    Reviewed: Allergy & Precautions, H&P , NPO status , Patient's Chart, lab work & pertinent test results  History of Anesthesia Complications Negative for: history of anesthetic complications  Airway Mallampati: III  TM Distance: >3 FB Neck ROM: Full    Dental no notable dental hx. (+) Dental Advisory Given   Pulmonary asthma , sleep apnea and Continuous Positive Airway Pressure Ventilation , pneumonia -, resolved, COPD COPD inhaler,  breath sounds clear to auscultation  Pulmonary exam normal       Cardiovascular hypertension, Pt. on medications Rhythm:Regular Rate:Normal  Cleared by cardiology with no further testing needed. Reports METS >4   Neuro/Psych  Headaches, negative psych ROS   GI/Hepatic Neg liver ROS, GERD-  Medicated and Controlled,  Endo/Other  diabetes, Type 2, Oral Hypoglycemic AgentsMorbid obesity  Renal/GU negative Renal ROS  negative genitourinary   Musculoskeletal  (+) Arthritis -, Osteoarthritis,    Abdominal (+) + obese,   Peds negative pediatric ROS (+)  Hematology negative hematology ROS (+)   Anesthesia Other Findings Hx of vocal cord paralysis s/p stenting  Reproductive/Obstetrics negative OB ROS                            Anesthesia Physical Anesthesia Plan  ASA: III  Anesthesia Plan: Spinal   Post-op Pain Management:    Induction: Intravenous  Airway Management Planned: Nasal Cannula  Additional Equipment:   Intra-op Plan:   Post-operative Plan: Extubation in OR  Informed Consent: I have reviewed the patients History and Physical, chart, labs and discussed the procedure including the risks, benefits and alternatives for the proposed anesthesia with the patient or authorized representative who has indicated his/her understanding and acceptance.   Dental advisory  given  Plan Discussed with: CRNA  Anesthesia Plan Comments:         Anesthesia Quick Evaluation

## 2014-04-22 MED ORDER — DEXTROSE 5 % IV SOLN
3.0000 g | INTRAVENOUS | Status: AC
  Administered 2014-04-23: 3 g via INTRAVENOUS
  Filled 2014-04-22: qty 3000

## 2014-04-22 NOTE — H&P (Signed)
TOTAL KNEE ADMISSION H&P  Patient is being admitted for left total knee arthroplasty.  Subjective:  Chief Complaint:   Left knee primary OA / pain.  HPI: Dominique Robbins, 63 y.o. female, has a history of pain and functional disability in the left knee due to arthritis and has failed non-surgical conservative treatments for greater than 12 weeks to include NSAID's and/or analgesics, corticosteriod injections and activity modification.  Onset of symptoms was gradual, starting 8+ years ago with gradually worsening course since that time. The patient noted prior procedures on the knee to include  micro fracture on the left knee(s).  Patient currently rates pain in the left knee(s) at 9 out of 10 with activity. Patient has night pain, worsening of pain with activity and weight bearing, pain that interferes with activities of daily living, pain with passive range of motion, crepitus and joint swelling.  Patient has evidence of periarticular osteophytes and joint space narrowing by imaging studies. There is no active infection.   Risks, benefits and expectations were discussed with the patient.  Risks including but not limited to the risk of anesthesia, blood clots, nerve damage, blood vessel damage, failure of the prosthesis, infection and up to and including death.  Patient understand the risks, benefits and expectations and wishes to proceed with surgery.   PCP: RECORD,CHARLES  D/C Plans:      Home with HHPT  Post-op Meds:       No Rx given   Tranexamic Acid:      To be given - IV    Decadron:      Is to be given  FYI:     ASA post-op  Norco post-op  CPAP    Patient Active Problem List   Diagnosis Date Noted  . Preop cardiovascular exam 04/05/2014  . Asthma with bronchitis 06/13/2008  . Obstructive sleep apnea 08/06/2007  . Morbid obesity 06/17/2007  . Seasonal and perennial allergic rhinitis 06/17/2007  . Disorder of vocal cord 06/17/2007   Past Medical History  Diagnosis Date  .  OSA on CPAP   . Allergic rhinitis, cause unspecified   . Vocal cord paralysis   . Bronchitis, asthmatic   . HTN (hypertension)   . Hyperlipidemia   . Diabetes mellitus   . Chronic bronchitis   . COPD (chronic obstructive pulmonary disease)     "uncertain of having this"  . Pneumonia     hx of years ago  . GERD (gastroesophageal reflux disease)   . Headache   . Arthritis     Past Surgical History  Procedure Laterality Date  . Rotator cuff repair Right   . Knee arthroscopy Left   . Vocal cord stent  2004  . Cholecystectomy  at least 15 years ago  . Appendectomy  63 years old  . Cesarean section  1980  . Cataract extraction, bilateral      No prescriptions prior to admission   No Known Allergies   History  Substance Use Topics  . Smoking status: Never Smoker   . Smokeless tobacco: Never Used  . Alcohol Use: No    Family History  Problem Relation Age of Onset  . Heart disease Mother 9568    CABG  . Heart disease Father 6668  . Diabetes Mother      Review of Systems  Constitutional: Negative.   Eyes: Negative.   Respiratory: Negative.   Cardiovascular: Negative.   Gastrointestinal: Positive for heartburn.  Genitourinary: Negative.   Musculoskeletal: Positive for joint  pain.  Skin: Negative.   Neurological: Positive for headaches.  Endo/Heme/Allergies: Positive for environmental allergies.  Psychiatric/Behavioral: Negative.     Objective:  Physical Exam  Constitutional: She is oriented to person, place, and time. She appears well-developed and well-nourished.  HENT:  Head: Normocephalic and atraumatic.  Eyes: Pupils are equal, round, and reactive to light.  Neck: Neck supple. No JVD present. No tracheal deviation present. No thyromegaly present.  Cardiovascular: Normal rate, regular rhythm, normal heart sounds and intact distal pulses.   Respiratory: Effort normal and breath sounds normal. No stridor. No respiratory distress. She has no wheezes.  GI: Soft.  There is no tenderness. There is no guarding.  Musculoskeletal:       Left knee: She exhibits decreased range of motion, swelling and bony tenderness. She exhibits no ecchymosis, no deformity, no laceration and no erythema. Tenderness found.  Lymphadenopathy:    She has no cervical adenopathy.  Neurological: She is alert and oriented to person, place, and time. 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  Skin: Skin is warm and dry.  Psychiatric: She has a normal mood and affect.      Labs:  Estimated body mass index is 39.27 kg/(m^2) as calculated from the following:   Height as of 12/05/13: 5' 6.5" (1.689 m).   Weight as of 12/05/13: 112.038 kg (247 lb).   Imaging Review Plain radiographs demonstrate severe degenerative joint disease of the left knee(s). The overall alignment is neutral. The bone quality appears to be good for age and reported activity level.  Assessment/Plan:  End stage arthritis, left knee   The patient history, physical examination, clinical judgment of the provider and imaging studies are consistent with end stage degenerative joint disease of the left knee(s) and total knee arthroplasty is deemed medically necessary. The treatment options including medical management, injection therapy arthroscopy and arthroplasty were discussed at length. The risks and benefits of total knee arthroplasty were presented and reviewed. The risks due to aseptic loosening, infection, stiffness, patella tracking problems, thromboembolic complications and other imponderables were discussed. The patient acknowledged the explanation, agreed to proceed with the plan and consent was signed. Patient is being admitted for inpatient treatment for surgery, pain control, PT, OT, prophylactic antibiotics, VTE prophylaxis, progressive ambulation and ADL's and discharge planning. The patient is planning to be discharged home with home health services.    Anastasio AuerbachMatthew S. Hommer Cunliffe    PA-C  04/22/2014, 6:47 PM

## 2014-04-23 ENCOUNTER — Inpatient Hospital Stay (HOSPITAL_COMMUNITY): Payer: BC Managed Care – PPO | Admitting: Anesthesiology

## 2014-04-23 ENCOUNTER — Inpatient Hospital Stay (HOSPITAL_COMMUNITY)
Admission: RE | Admit: 2014-04-23 | Discharge: 2014-04-25 | DRG: 470 | Disposition: A | Discharge status: Home / Self Care | Payer: BC Managed Care – PPO | Source: Ambulatory Visit | Attending: Orthopedic Surgery | Admitting: Orthopedic Surgery

## 2014-04-23 ENCOUNTER — Encounter (HOSPITAL_COMMUNITY)
Admission: RE | Disposition: A | Discharge status: Home / Self Care | Payer: Self-pay | Source: Ambulatory Visit | Attending: Orthopedic Surgery

## 2014-04-23 ENCOUNTER — Encounter (HOSPITAL_COMMUNITY): Payer: Self-pay | Admitting: *Deleted

## 2014-04-23 DIAGNOSIS — E785 Hyperlipidemia, unspecified: Secondary | ICD-10-CM | POA: Diagnosis present

## 2014-04-23 DIAGNOSIS — E119 Type 2 diabetes mellitus without complications: Secondary | ICD-10-CM | POA: Diagnosis present

## 2014-04-23 DIAGNOSIS — I10 Essential (primary) hypertension: Secondary | ICD-10-CM | POA: Diagnosis present

## 2014-04-23 DIAGNOSIS — G4733 Obstructive sleep apnea (adult) (pediatric): Secondary | ICD-10-CM | POA: Diagnosis present

## 2014-04-23 DIAGNOSIS — Z9049 Acquired absence of other specified parts of digestive tract: Secondary | ICD-10-CM | POA: Diagnosis present

## 2014-04-23 DIAGNOSIS — Z9842 Cataract extraction status, left eye: Secondary | ICD-10-CM

## 2014-04-23 DIAGNOSIS — K219 Gastro-esophageal reflux disease without esophagitis: Secondary | ICD-10-CM | POA: Diagnosis present

## 2014-04-23 DIAGNOSIS — Z9841 Cataract extraction status, right eye: Secondary | ICD-10-CM | POA: Diagnosis not present

## 2014-04-23 DIAGNOSIS — J45909 Unspecified asthma, uncomplicated: Secondary | ICD-10-CM | POA: Diagnosis present

## 2014-04-23 DIAGNOSIS — M1712 Unilateral primary osteoarthritis, left knee: Principal | ICD-10-CM | POA: Diagnosis present

## 2014-04-23 DIAGNOSIS — Z6839 Body mass index (BMI) 39.0-39.9, adult: Secondary | ICD-10-CM

## 2014-04-23 DIAGNOSIS — Z96652 Presence of left artificial knee joint: Secondary | ICD-10-CM

## 2014-04-23 DIAGNOSIS — J449 Chronic obstructive pulmonary disease, unspecified: Secondary | ICD-10-CM | POA: Diagnosis present

## 2014-04-23 DIAGNOSIS — Z96659 Presence of unspecified artificial knee joint: Secondary | ICD-10-CM

## 2014-04-23 DIAGNOSIS — M25562 Pain in left knee: Secondary | ICD-10-CM | POA: Diagnosis present

## 2014-04-23 HISTORY — PX: TOTAL KNEE ARTHROPLASTY: SHX125

## 2014-04-23 LAB — GLUCOSE, CAPILLARY
GLUCOSE-CAPILLARY: 132 mg/dL — AB (ref 70–99)
GLUCOSE-CAPILLARY: 116 mg/dL — AB (ref 70–99)
GLUCOSE-CAPILLARY: 139 mg/dL — AB (ref 70–99)
GLUCOSE-CAPILLARY: 130 mg/dL — AB (ref 70–99)
Glucose-Capillary: 163 mg/dL — ABNORMAL HIGH (ref 70–99)

## 2014-04-23 LAB — TYPE AND SCREEN
ABO/RH(D): A POS
Antibody Screen: NEGATIVE

## 2014-04-23 LAB — ABO/RH: ABO/RH(D): A POS

## 2014-04-23 SURGERY — ARTHROPLASTY, KNEE, TOTAL
Anesthesia: Spinal | Site: Knee | Laterality: Left

## 2014-04-23 MED ORDER — ONDANSETRON HCL 4 MG/2ML IJ SOLN: 4.0000 mg | Freq: Once | INTRAMUSCULAR | Status: DC | PRN

## 2014-04-23 MED ORDER — POLYVINYL ALCOHOL 1.4 % OP SOLN
1.0000 [drp] | Freq: Two times a day (BID) | OPHTHALMIC | Status: DC | PRN
  Filled 2014-04-23: qty 15

## 2014-04-23 MED ORDER — CARBAMAZEPINE 200 MG PO TABS
200.0000 mg | ORAL_TABLET | Freq: Two times a day (BID) | ORAL | Status: DC
  Administered 2014-04-23 – 2014-04-25 (×5): 200 mg via ORAL
  Filled 2014-04-23 (×6): qty 1

## 2014-04-23 MED ORDER — PHENYLEPHRINE HCL 10 MG/ML IJ SOLN
INTRAMUSCULAR | Status: AC
  Filled 2014-04-23: qty 1

## 2014-04-23 MED ORDER — SODIUM CHLORIDE 0.9 % IJ SOLN
INTRAMUSCULAR | Status: DC | PRN
  Administered 2014-04-23: 29 mL

## 2014-04-23 MED ORDER — IPRATROPIUM BROMIDE 0.03 % NA SOLN
2.0000 | Freq: Four times a day (QID) | NASAL | Status: DC
  Administered 2014-04-23 – 2014-04-25 (×5): 2 via NASAL
  Filled 2014-04-23: qty 15

## 2014-04-23 MED ORDER — MAGNESIUM CITRATE PO SOLN: 1.0000 | Freq: Once | ORAL | Status: AC | PRN

## 2014-04-23 MED ORDER — SODIUM CHLORIDE 0.9 % IV SOLN
INTRAVENOUS | Status: DC
  Administered 2014-04-23: 14:00:00 via INTRAVENOUS
  Filled 2014-04-23 (×6): qty 1000

## 2014-04-23 MED ORDER — FENTANYL CITRATE 0.05 MG/ML IJ SOLN
INTRAMUSCULAR | Status: AC
  Filled 2014-04-23: qty 2

## 2014-04-23 MED ORDER — DOCUSATE SODIUM 100 MG PO CAPS
100.0000 mg | ORAL_CAPSULE | Freq: Two times a day (BID) | ORAL | Status: DC
  Administered 2014-04-23 – 2014-04-25 (×5): 100 mg via ORAL

## 2014-04-23 MED ORDER — DEXAMETHASONE SODIUM PHOSPHATE 10 MG/ML IJ SOLN
INTRAMUSCULAR | Status: AC
  Filled 2014-04-23: qty 1

## 2014-04-23 MED ORDER — FERROUS SULFATE 325 (65 FE) MG PO TABS
325.0000 mg | ORAL_TABLET | Freq: Three times a day (TID) | ORAL | Status: DC
  Administered 2014-04-23 – 2014-04-25 (×5): 325 mg via ORAL
  Filled 2014-04-23 (×9): qty 1

## 2014-04-23 MED ORDER — ALBUTEROL SULFATE (2.5 MG/3ML) 0.083% IN NEBU: 3.0000 mL | INHALATION_SOLUTION | Freq: Four times a day (QID) | RESPIRATORY_TRACT | Status: DC | PRN

## 2014-04-23 MED ORDER — SODIUM CHLORIDE 0.9 % IJ SOLN
INTRAMUSCULAR | Status: AC
  Filled 2014-04-23: qty 50

## 2014-04-23 MED ORDER — ONDANSETRON HCL 4 MG/2ML IJ SOLN
4.0000 mg | Freq: Four times a day (QID) | INTRAMUSCULAR | Status: DC | PRN
Start: 2014-04-23 — End: 2014-04-25
  Administered 2014-04-25: 4 mg via INTRAVENOUS
  Filled 2014-04-23: qty 2

## 2014-04-23 MED ORDER — ALBUTEROL SULFATE (2.5 MG/3ML) 0.083% IN NEBU: 2.5000 mg | INHALATION_SOLUTION | Freq: Four times a day (QID) | RESPIRATORY_TRACT | Status: DC | PRN

## 2014-04-23 MED ORDER — SODIUM CHLORIDE 0.9 % IR SOLN
Status: DC | PRN
  Administered 2014-04-23: 1

## 2014-04-23 MED ORDER — BUPIVACAINE-EPINEPHRINE (PF) 0.25% -1:200000 IJ SOLN
INTRAMUSCULAR | Status: AC
  Filled 2014-04-23: qty 30

## 2014-04-23 MED ORDER — POLYETHYLENE GLYCOL 3350 17 G PO PACK
17.0000 g | PACK | Freq: Two times a day (BID) | ORAL | Status: DC
  Administered 2014-04-24 – 2014-04-25 (×2): 17 g via ORAL

## 2014-04-23 MED ORDER — DIPHENHYDRAMINE HCL 25 MG PO CAPS: 25.0000 mg | ORAL_CAPSULE | Freq: Four times a day (QID) | ORAL | Status: DC | PRN

## 2014-04-23 MED ORDER — GLIMEPIRIDE 2 MG PO TABS
2.0000 mg | ORAL_TABLET | Freq: Every day | ORAL | Status: DC
Start: 2014-04-24 — End: 2014-04-25
  Administered 2014-04-24 – 2014-04-25 (×2): 2 mg via ORAL
  Filled 2014-04-23 (×3): qty 1

## 2014-04-23 MED ORDER — KETOROLAC TROMETHAMINE 30 MG/ML IJ SOLN
INTRAMUSCULAR | Status: DC | PRN
  Administered 2014-04-23: 30 mg

## 2014-04-23 MED ORDER — ROSUVASTATIN CALCIUM 20 MG PO TABS
20.0000 mg | ORAL_TABLET | Freq: Every morning | ORAL | Status: DC
  Administered 2014-04-23 – 2014-04-25 (×3): 20 mg via ORAL
  Filled 2014-04-23 (×3): qty 1

## 2014-04-23 MED ORDER — INSULIN ASPART 100 UNIT/ML ~~LOC~~ SOLN
0.0000 [IU] | Freq: Three times a day (TID) | SUBCUTANEOUS | Status: DC
  Administered 2014-04-23 – 2014-04-24 (×2): 3 [IU] via SUBCUTANEOUS

## 2014-04-23 MED ORDER — PROPOFOL 10 MG/ML IV BOLUS
INTRAVENOUS | Status: DC | PRN
  Administered 2014-04-23: 30 mg via INTRAVENOUS
  Administered 2014-04-23: 20 mg via INTRAVENOUS

## 2014-04-23 MED ORDER — GABAPENTIN 300 MG PO CAPS
600.0000 mg | ORAL_CAPSULE | Freq: Two times a day (BID) | ORAL | Status: DC
  Administered 2014-04-23 – 2014-04-25 (×5): 600 mg via ORAL
  Filled 2014-04-23 (×6): qty 2

## 2014-04-23 MED ORDER — FENTANYL CITRATE 0.05 MG/ML IJ SOLN
25.0000 ug | INTRAMUSCULAR | Status: DC | PRN
  Administered 2014-04-23 (×2): 50 ug via INTRAVENOUS

## 2014-04-23 MED ORDER — BISACODYL 10 MG RE SUPP: 10.0000 mg | Freq: Every day | RECTAL | Status: DC | PRN

## 2014-04-23 MED ORDER — METHOCARBAMOL 1000 MG/10ML IJ SOLN
500.0000 mg | Freq: Four times a day (QID) | INTRAVENOUS | Status: DC | PRN
  Administered 2014-04-23: 500 mg via INTRAVENOUS
  Filled 2014-04-23 (×2): qty 5

## 2014-04-23 MED ORDER — PROPOFOL 10 MG/ML IV BOLUS
INTRAVENOUS | Status: AC
  Filled 2014-04-23: qty 20

## 2014-04-23 MED ORDER — SERTRALINE HCL 100 MG PO TABS
200.0000 mg | ORAL_TABLET | Freq: Every morning | ORAL | Status: DC
  Administered 2014-04-23 – 2014-04-25 (×3): 200 mg via ORAL
  Filled 2014-04-23 (×3): qty 2

## 2014-04-23 MED ORDER — METOCLOPRAMIDE HCL 5 MG/ML IJ SOLN: 5.0000 mg | Freq: Three times a day (TID) | INTRAMUSCULAR | Status: DC | PRN

## 2014-04-23 MED ORDER — MONTELUKAST SODIUM 10 MG PO TABS
10.0000 mg | ORAL_TABLET | Freq: Every morning | ORAL | Status: DC
  Administered 2014-04-23 – 2014-04-25 (×3): 10 mg via ORAL
  Filled 2014-04-23 (×3): qty 1

## 2014-04-23 MED ORDER — HYOSCYAMINE SULFATE 0.125 MG PO TABS
0.1250 mg | ORAL_TABLET | Freq: Four times a day (QID) | ORAL | Status: DC | PRN
  Filled 2014-04-23: qty 1

## 2014-04-23 MED ORDER — AZELASTINE HCL 0.1 % NA SOLN
1.0000 | Freq: Two times a day (BID) | NASAL | Status: DC
  Administered 2014-04-24 – 2014-04-25 (×3): 1 via NASAL
  Filled 2014-04-23: qty 30

## 2014-04-23 MED ORDER — MIDAZOLAM HCL 5 MG/5ML IJ SOLN
INTRAMUSCULAR | Status: DC | PRN
  Administered 2014-04-23: 2 mg via INTRAVENOUS

## 2014-04-23 MED ORDER — MECLIZINE HCL 25 MG PO TABS
25.0000 mg | ORAL_TABLET | Freq: Three times a day (TID) | ORAL | Status: DC | PRN
  Filled 2014-04-23: qty 1

## 2014-04-23 MED ORDER — BUPIVACAINE IN DEXTROSE 0.75-8.25 % IT SOLN
INTRATHECAL | Status: DC | PRN
  Administered 2014-04-23: 2 mL via INTRATHECAL

## 2014-04-23 MED ORDER — IPRATROPIUM BROMIDE 0.02 % IN SOLN
2.5000 mL | Freq: Four times a day (QID) | RESPIRATORY_TRACT | Status: DC
  Filled 2014-04-23: qty 2.5

## 2014-04-23 MED ORDER — CELECOXIB 200 MG PO CAPS
200.0000 mg | ORAL_CAPSULE | Freq: Two times a day (BID) | ORAL | Status: DC
  Administered 2014-04-23 – 2014-04-25 (×5): 200 mg via ORAL
  Filled 2014-04-23 (×6): qty 1

## 2014-04-23 MED ORDER — FENTANYL CITRATE 0.05 MG/ML IJ SOLN
INTRAMUSCULAR | Status: DC | PRN
  Administered 2014-04-23 (×2): 50 ug via INTRAVENOUS

## 2014-04-23 MED ORDER — KETOROLAC TROMETHAMINE 30 MG/ML IJ SOLN
INTRAMUSCULAR | Status: AC
  Filled 2014-04-23: qty 1

## 2014-04-23 MED ORDER — PROPOFOL INFUSION 10 MG/ML OPTIME
INTRAVENOUS | Status: DC | PRN
  Administered 2014-04-23: 75 ug/kg/min via INTRAVENOUS

## 2014-04-23 MED ORDER — TRANEXAMIC ACID 100 MG/ML IV SOLN
1000.0000 mg | Freq: Once | INTRAVENOUS | Status: AC
  Administered 2014-04-23: 1000 mg via INTRAVENOUS
  Filled 2014-04-23: qty 10

## 2014-04-23 MED ORDER — HYDROCHLOROTHIAZIDE 25 MG PO TABS
25.0000 mg | ORAL_TABLET | Freq: Every day | ORAL | Status: DC
  Administered 2014-04-25: 25 mg via ORAL
  Filled 2014-04-23 (×3): qty 1

## 2014-04-23 MED ORDER — LORATADINE 10 MG PO TABS
10.0000 mg | ORAL_TABLET | Freq: Every morning | ORAL | Status: DC
  Administered 2014-04-23 – 2014-04-25 (×3): 10 mg via ORAL
  Filled 2014-04-23 (×3): qty 1

## 2014-04-23 MED ORDER — LOSARTAN POTASSIUM 50 MG PO TABS
100.0000 mg | ORAL_TABLET | Freq: Every day | ORAL | Status: DC
  Administered 2014-04-25: 100 mg via ORAL
  Filled 2014-04-23 (×3): qty 2

## 2014-04-23 MED ORDER — DEXAMETHASONE SODIUM PHOSPHATE 10 MG/ML IJ SOLN
10.0000 mg | Freq: Once | INTRAMUSCULAR | Status: AC
  Administered 2014-04-24: 10 mg via INTRAVENOUS
  Filled 2014-04-23: qty 1

## 2014-04-23 MED ORDER — BECLOMETHASONE DIPROPIONATE 80 MCG/ACT NA AERS: 1.0000 | INHALATION_SPRAY | Freq: Every morning | NASAL | Status: DC

## 2014-04-23 MED ORDER — DIAZEPAM 5 MG PO TABS: 5.0000 mg | ORAL_TABLET | Freq: Every evening | ORAL | Status: DC | PRN

## 2014-04-23 MED ORDER — MENTHOL 3 MG MT LOZG
1.0000 | LOZENGE | OROMUCOSAL | Status: DC | PRN
  Filled 2014-04-23: qty 9

## 2014-04-23 MED ORDER — METFORMIN HCL 500 MG PO TABS
1000.0000 mg | ORAL_TABLET | Freq: Two times a day (BID) | ORAL | Status: DC
  Administered 2014-04-23 – 2014-04-25 (×4): 1000 mg via ORAL
  Filled 2014-04-23 (×6): qty 2

## 2014-04-23 MED ORDER — ALUM & MAG HYDROXIDE-SIMETH 200-200-20 MG/5ML PO SUSP: 30.0000 mL | ORAL | Status: DC | PRN

## 2014-04-23 MED ORDER — LACTATED RINGERS IV SOLN
INTRAVENOUS | Status: DC | PRN
  Administered 2014-04-23 (×2): via INTRAVENOUS

## 2014-04-23 MED ORDER — ONDANSETRON HCL 4 MG PO TABS
4.0000 mg | ORAL_TABLET | Freq: Four times a day (QID) | ORAL | Status: DC | PRN
Start: 2014-04-23 — End: 2014-04-25
  Administered 2014-04-23: 4 mg via ORAL
  Filled 2014-04-23: qty 1

## 2014-04-23 MED ORDER — CEFAZOLIN SODIUM-DEXTROSE 2-3 GM-% IV SOLR
2.0000 g | Freq: Four times a day (QID) | INTRAVENOUS | Status: AC
  Administered 2014-04-23 (×2): 2 g via INTRAVENOUS
  Filled 2014-04-23 (×2): qty 50

## 2014-04-23 MED ORDER — MIDAZOLAM HCL 2 MG/2ML IJ SOLN
INTRAMUSCULAR | Status: AC
  Filled 2014-04-23: qty 2

## 2014-04-23 MED ORDER — LOSARTAN POTASSIUM-HCTZ 100-25 MG PO TABS: 1.0000 | ORAL_TABLET | Freq: Every day | ORAL | Status: DC

## 2014-04-23 MED ORDER — ONDANSETRON HCL 4 MG/2ML IJ SOLN
INTRAMUSCULAR | Status: AC
  Filled 2014-04-23: qty 2

## 2014-04-23 MED ORDER — MOMETASONE FURO-FORMOTEROL FUM 200-5 MCG/ACT IN AERO
2.0000 | INHALATION_SPRAY | Freq: Two times a day (BID) | RESPIRATORY_TRACT | Status: DC
  Administered 2014-04-23 – 2014-04-25 (×4): 2 via RESPIRATORY_TRACT
  Filled 2014-04-23: qty 8.8

## 2014-04-23 MED ORDER — ASPIRIN EC 325 MG PO TBEC
325.0000 mg | DELAYED_RELEASE_TABLET | Freq: Two times a day (BID) | ORAL | Status: DC
  Administered 2014-04-24 – 2014-04-25 (×3): 325 mg via ORAL
  Filled 2014-04-23 (×5): qty 1

## 2014-04-23 MED ORDER — DEXAMETHASONE SODIUM PHOSPHATE 10 MG/ML IJ SOLN
10.0000 mg | Freq: Once | INTRAMUSCULAR | Status: AC
  Administered 2014-04-23: 10 mg via INTRAVENOUS

## 2014-04-23 MED ORDER — PHENOL 1.4 % MT LIQD
1.0000 | OROMUCOSAL | Status: DC | PRN
  Filled 2014-04-23: qty 177

## 2014-04-23 MED ORDER — BUSPIRONE HCL 5 MG PO TABS
15.0000 mg | ORAL_TABLET | Freq: Two times a day (BID) | ORAL | Status: DC
  Administered 2014-04-23 – 2014-04-25 (×5): 15 mg via ORAL
  Filled 2014-04-23 (×9): qty 1

## 2014-04-23 MED ORDER — BUSPIRONE HCL 15 MG PO TABS
15.0000 mg | ORAL_TABLET | Freq: Two times a day (BID) | ORAL | Status: DC
  Filled 2014-04-23 (×2): qty 1

## 2014-04-23 MED ORDER — BUPIVACAINE-EPINEPHRINE (PF) 0.25% -1:200000 IJ SOLN
INTRAMUSCULAR | Status: DC | PRN
  Administered 2014-04-23: 30 mL

## 2014-04-23 MED ORDER — METHOCARBAMOL 500 MG PO TABS
500.0000 mg | ORAL_TABLET | Freq: Four times a day (QID) | ORAL | Status: DC | PRN
  Administered 2014-04-23 – 2014-04-25 (×5): 500 mg via ORAL
  Filled 2014-04-23 (×5): qty 1

## 2014-04-23 MED ORDER — HYDROMORPHONE HCL 1 MG/ML IJ SOLN
0.5000 mg | INTRAMUSCULAR | Status: DC | PRN
  Administered 2014-04-23: 2 mg via INTRAVENOUS
  Administered 2014-04-23: 0.5 mg via INTRAVENOUS
  Administered 2014-04-23: 1 mg via INTRAVENOUS
  Administered 2014-04-23: 0.5 mg via INTRAVENOUS
  Administered 2014-04-23: 2 mg via INTRAVENOUS
  Administered 2014-04-24 (×2): 1 mg via INTRAVENOUS
  Administered 2014-04-25: 2 mg via INTRAVENOUS
  Filled 2014-04-23: qty 2
  Filled 2014-04-23 (×3): qty 1
  Filled 2014-04-23: qty 2
  Filled 2014-04-23: qty 1
  Filled 2014-04-23: qty 2

## 2014-04-23 MED ORDER — HYDROCODONE-ACETAMINOPHEN 7.5-325 MG PO TABS
1.0000 | ORAL_TABLET | ORAL | Status: DC
  Administered 2014-04-23 – 2014-04-25 (×9): 2 via ORAL
  Administered 2014-04-25: 1 via ORAL
  Administered 2014-04-25: 2 via ORAL
  Filled 2014-04-23 (×12): qty 2

## 2014-04-23 MED ORDER — IPRATROPIUM BROMIDE 0.02 % IN SOLN: 2.5000 mL | Freq: Four times a day (QID) | RESPIRATORY_TRACT | Status: DC | PRN

## 2014-04-23 MED ORDER — METOCLOPRAMIDE HCL 10 MG PO TABS: 5.0000 mg | ORAL_TABLET | Freq: Three times a day (TID) | ORAL | Status: DC | PRN

## 2014-04-23 MED ORDER — PANTOPRAZOLE SODIUM 40 MG PO TBEC
80.0000 mg | DELAYED_RELEASE_TABLET | Freq: Every day | ORAL | Status: DC
  Administered 2014-04-23 – 2014-04-25 (×3): 80 mg via ORAL
  Filled 2014-04-23 (×4): qty 2

## 2014-04-23 MED ORDER — TIOTROPIUM BROMIDE MONOHYDRATE 18 MCG IN CAPS
18.0000 ug | ORAL_CAPSULE | Freq: Every morning | RESPIRATORY_TRACT | Status: DC
  Administered 2014-04-24 – 2014-04-25 (×2): 18 ug via RESPIRATORY_TRACT
  Filled 2014-04-23: qty 5

## 2014-04-23 MED ORDER — CICLESONIDE 37 MCG/ACT NA AERS: 2.0000 | INHALATION_SPRAY | Freq: Every day | NASAL | Status: DC

## 2014-04-23 MED ORDER — FLUTICASONE PROPIONATE 50 MCG/ACT NA SUSP
1.0000 | Freq: Every day | NASAL | Status: DC
  Administered 2014-04-24: 2 via NASAL
  Filled 2014-04-23: qty 16

## 2014-04-23 SURGICAL SUPPLY — 53 items
BAG ZIPLOCK 12X15 (MISCELLANEOUS) IMPLANT
BANDAGE ELASTIC 6 VELCRO ST LF (GAUZE/BANDAGES/DRESSINGS) ×2 IMPLANT
BANDAGE ESMARK 6X9 LF (GAUZE/BANDAGES/DRESSINGS) ×1 IMPLANT
BLADE SAW SGTL 13.0X1.19X90.0M (BLADE) ×2 IMPLANT
BNDG ESMARK 6X9 LF (GAUZE/BANDAGES/DRESSINGS) ×2
BONE CEMENT GENTAMICIN (Cement) ×4 IMPLANT
BOWL SMART MIX CTS (DISPOSABLE) ×2 IMPLANT
CAP KNEE TOTAL 3 SIGMA ×2 IMPLANT
CAP UPCHARGE REVISION TRAY ×2 IMPLANT
CEMENT BONE GENTAMICIN 40 (Cement) ×2 IMPLANT
CUFF TOURN SGL QUICK 34 (TOURNIQUET CUFF) ×1
CUFF TRNQT CYL 34X4X40X1 (TOURNIQUET CUFF) ×1 IMPLANT
DECANTER SPIKE VIAL GLASS SM (MISCELLANEOUS) ×2 IMPLANT
DRAPE EXTREMITY T 121X128X90 (DRAPE) ×2 IMPLANT
DRAPE POUCH INSTRU U-SHP 10X18 (DRAPES) ×2 IMPLANT
DRAPE U-SHAPE 47X51 STRL (DRAPES) ×2 IMPLANT
DRSG AQUACEL AG ADV 3.5X10 (GAUZE/BANDAGES/DRESSINGS) ×2 IMPLANT
DURAPREP 26ML APPLICATOR (WOUND CARE) ×4 IMPLANT
ELECT REM PT RETURN 9FT ADLT (ELECTROSURGICAL) ×2
ELECTRODE REM PT RTRN 9FT ADLT (ELECTROSURGICAL) ×1 IMPLANT
FACESHIELD WRAPAROUND (MASK) ×10 IMPLANT
GLOVE BIOGEL PI IND STRL 7.5 (GLOVE) ×1 IMPLANT
GLOVE BIOGEL PI IND STRL 8.5 (GLOVE) ×1 IMPLANT
GLOVE BIOGEL PI INDICATOR 7.5 (GLOVE) ×1
GLOVE BIOGEL PI INDICATOR 8.5 (GLOVE) ×1
GLOVE ECLIPSE 8.0 STRL XLNG CF (GLOVE) ×2 IMPLANT
GLOVE ORTHO TXT STRL SZ7.5 (GLOVE) ×4 IMPLANT
GOWN SPEC L3 XXLG W/TWL (GOWN DISPOSABLE) ×2 IMPLANT
GOWN STRL REUS W/TWL LRG LVL3 (GOWN DISPOSABLE) ×2 IMPLANT
HANDPIECE INTERPULSE COAX TIP (DISPOSABLE) ×1
HOVERMATT HALF SINGLE USE (PATIENT TRANSFER) ×2 IMPLANT
KIT BASIN OR (CUSTOM PROCEDURE TRAY) ×2 IMPLANT
LIQUID BAND (GAUZE/BANDAGES/DRESSINGS) ×2 IMPLANT
MANIFOLD NEPTUNE II (INSTRUMENTS) ×2 IMPLANT
NDL SAFETY ECLIPSE 18X1.5 (NEEDLE) ×1 IMPLANT
NEEDLE HYPO 18GX1.5 SHARP (NEEDLE) ×1
NS IRRIG 1000ML POUR BTL (IV SOLUTION) ×2 IMPLANT
PACK TOTAL JOINT (CUSTOM PROCEDURE TRAY) ×2 IMPLANT
POSITIONER SURGICAL ARM (MISCELLANEOUS) ×2 IMPLANT
SET HNDPC FAN SPRY TIP SCT (DISPOSABLE) ×1 IMPLANT
SET PAD KNEE POSITIONER (MISCELLANEOUS) ×2 IMPLANT
SUCTION FRAZIER 12FR DISP (SUCTIONS) ×2 IMPLANT
SUT MNCRL AB 4-0 PS2 18 (SUTURE) ×2 IMPLANT
SUT VIC AB 1 CT1 36 (SUTURE) ×2 IMPLANT
SUT VIC AB 2-0 CT1 27 (SUTURE) ×3
SUT VIC AB 2-0 CT1 TAPERPNT 27 (SUTURE) ×3 IMPLANT
SUT VLOC 180 0 24IN GS25 (SUTURE) ×2 IMPLANT
SYR 50ML LL SCALE MARK (SYRINGE) ×2 IMPLANT
TOWEL OR 17X26 10 PK STRL BLUE (TOWEL DISPOSABLE) ×2 IMPLANT
TOWEL OR NON WOVEN STRL DISP B (DISPOSABLE) IMPLANT
TRAY FOLEY CATH 14FRSI W/METER (CATHETERS) ×2 IMPLANT
WATER STERILE IRR 1500ML POUR (IV SOLUTION) ×2 IMPLANT
WRAP KNEE MAXI GEL POST OP (GAUZE/BANDAGES/DRESSINGS) ×2 IMPLANT

## 2014-04-23 NOTE — Transfer of Care (Signed)
Immediate Anesthesia Transfer of Care Note  Patient: Dominique PalmsElizabeth D Bratz  Procedure(s) Performed: Procedure(s): LEFT TOTAL KNEE ARTHROPLASTY (Left)  Patient Location: PACU  Anesthesia Type:Spinal  Level of Consciousness: awake, alert  and oriented  Airway & Oxygen Therapy: Patient Spontanous Breathing and Patient connected to face mask oxygen  Post-op Assessment: Report given to PACU RN and Post -op Vital signs reviewed and stable  Post vital signs: Reviewed and stable  Complications: No apparent anesthesia complications

## 2014-04-23 NOTE — Anesthesia Procedure Notes (Addendum)
Spinal Patient location during procedure: OR Start time: 04/23/2014 8:49 AM End time: 04/23/2014 8:55 AM Staffing Resident/CRNA: Anne Fu Performed by: resident/CRNA  Preanesthetic Checklist Completed: patient identified, site marked, surgical consent, pre-op evaluation, timeout performed, IV checked, risks and benefits discussed and monitors and equipment checked Spinal Block Patient position: sitting Prep: Betadine Patient monitoring: heart rate, continuous pulse ox and blood pressure Approach: right paramedian Location: L2-3 Injection technique: single-shot Needle Needle type: Spinocan  Needle gauge: 22 G Needle length: 9 cm Assessment Sensory level: T6 Additional Notes Expiration date of kit checked and confirmed. Patient tolerated procedure well, without complications.  X 1 attempt with noted clear CSF return easy aspiration and injection. Noted loss of motor and sensory on exam.

## 2014-04-23 NOTE — Plan of Care (Signed)
Problem: Consults Goal: Total Joint Replacement Patient Education See Patient Education Module for education specifics. Outcome: Progressing Goal: Diagnosis- Total Joint Replacement Primary Total Knee Goal: Skin Care Protocol Initiated - if Braden Score 18 or less If consults are not indicated, leave blank or document N/A Outcome: Progressing Goal: Nutrition Consult-if indicated Outcome: Progressing Goal: Diabetes Guidelines if Diabetic/Glucose > 140 If diabetic or lab glucose is > 140 mg/dl - Initiate Diabetes/Hyperglycemia Guidelines & Document Interventions  Outcome: Not Applicable Date Met:  84/57/33  Problem: Phase I Progression Outcomes Goal: CMS/Neurovascular status WDL Outcome: Progressing Goal: Pain controlled with appropriate interventions Outcome: Progressing Goal: Dangle or out of bed evening of surgery Outcome: Completed/Met Date Met:  04/23/14

## 2014-04-23 NOTE — Evaluation (Signed)
Physical Therapy Evaluation Patient Details Name: Dominique Robbins MRN: [id number] DOB: 01/30/1951 Today's Date: 04/23/2014   History of Present Illness  Pt is a 63 year old female s/p L TKA.  Clinical Impression  Pt is s/p L TKA resulting in the deficits listed below (see PT Problem List).  Pt will benefit from skilled PT to increase their independence and safety with mobility to allow discharge to the venue listed below.  Pt assisted with ambulation POD #0 and plans to return home upon d/c with spouse.  Pt lives in split level home so will need to practice steps prior to d/c.      Follow Up Recommendations Home health PT    Equipment Recommendations  Rolling walker with 5" wheels    Recommendations for Other Services       Precautions / Restrictions Precautions Precautions: Knee Restrictions Other Position/Activity Restrictions: WBAT      Mobility  Bed Mobility Overal bed mobility: Needs Assistance Bed Mobility: Supine to Sit     Supine to sit: Min assist     General bed mobility comments: assist for L LE  Transfers Overall transfer level: Needs assistance Equipment used: Rolling walker (2 wheeled) Transfers: Sit to/from Stand Sit to Stand: Min assist         General transfer comment: verbal cues for safe technique including UE and LE positioning  Ambulation/Gait Ambulation/Gait assistance: Min assist Ambulation Distance (Feet): 15 Feet Assistive device: Rolling walker (2 wheeled) Gait Pattern/deviations: Step-to pattern;Antalgic;Decreased stance time - left Gait velocity: decr   General Gait Details: verbal cues for sequence, RW distance, posture, step length, pt reporting increased L knee pain during WBing so recliner followed  Stairs            Wheelchair Mobility    Modified Rankin (Stroke Patients Only)       Balance                                             Pertinent Vitals/Pain Pain Assessment: 0-10 Pain  Score: 8  Pain Location: L TKA during gait Pain Descriptors / Indicators: Sore;Aching Pain Intervention(s): Limited activity within patient's tolerance;Monitored during session;Premedicated before session;Repositioned    Home Living Family/patient expects to be discharged to:: Private residence Living Arrangements: Spouse/significant other   Type of Home: House       Home Layout: Two level (split level) Home Equipment: Cane - single point      Prior Function Level of Independence: Independent               Hand Dominance        Extremity/Trunk Assessment               Lower Extremity Assessment: LLE deficits/detail   LLE Deficits / Details: unable to perform SLR, fair quad contraction, AROM limited to approx 20* due to pain     Communication   Communication: No difficulties  Cognition Arousal/Alertness: Awake/alert Behavior During Therapy: WFL for tasks assessed/performed Overall Cognitive Status: Within Functional Limits for tasks assessed                      General Comments      Exercises        Assessment/Plan    PT Assessment Patient needs continued PT services  PT Diagnosis Difficulty walking;Acute pain   PT Problem List  Decreased strength;Decreased range of motion;Decreased mobility;Decreased knowledge of precautions;Pain;Decreased knowledge of use of DME  PT Treatment Interventions Functional mobility training;Gait training;DME instruction;Stair training;Patient/family education;Therapeutic activities;Therapeutic exercise   PT Goals (Current goals can be found in the Care Plan section) Acute Rehab PT Goals PT Goal Formulation: With patient Time For Goal Achievement: 05/07/14 Potential to Achieve Goals: Good    Frequency 7X/week   Barriers to discharge        Co-evaluation               End of Session   Activity Tolerance: Patient limited by pain Patient left: in chair;with call bell/phone within reach            Time: [id number] PT Time Calculation (min) (ACUTE ONLY): 12 min   Charges:   PT Evaluation $Initial PT Evaluation Tier I: 1 Procedure PT Treatments $Gait Training: 8-22 mins   PT G Codes:          Jaivyn Gulla,KATHrine E 04/23/2014, 4:34 PM Zenovia JarredKati Tinsley Lomas, PT, DPT 04/23/2014 Pager: [phone]

## 2014-04-23 NOTE — Op Note (Signed)
NAME:  [facility]                      MEDICAL RECORD NO.:  [id number]                             [facility]      PHYSICIAN:  Madlyn FrankelMatthew D. Charlann Boxerlin, M.D.  DATE OF BIRTH:  11/26/1950      DATE OF PROCEDURE:  04/23/2014                                     OPERATIVE REPORT         PREOPERATIVE DIAGNOSIS:  Left knee osteoarthritis.      POSTOPERATIVE DIAGNOSIS:  Left knee osteoarthritis.      FINDINGS:  The patient was noted to have complete loss of cartilage and   bone-on-bone arthritis with associated osteophytes in the medail and patellofemoral compartments of   the knee with a significant synovitis and associated effusion.      PROCEDURE:  Left total knee replacement.      COMPONENTS USED:  DePuy rotating platform posterior stabilized knee   system, a size 4N femur, 4 MBT revision tibial tray, 10 PS mm insert, and 35 patellar   button.      SURGEON:  Madlyn FrankelMatthew D. Charlann Boxerlin, M.D.      ASSISTANT:  Lanney GinsMatthew Babish, PA-C.      ANESTHESIA:  Spinal.      SPECIMENS:  None.      COMPLICATION:  None.      DRAINS:  None.  EBL: <100cc      TOURNIQUET TIME:   Total Tourniquet Time Documented: Thigh (Left) - 34 minutes Total: Thigh (Left) - 34 minutes  .      The patient was stable to the recovery room.      INDICATION FOR PROCEDURE:  Dominique Robbins is a 63 y.o. female patient of   mine.  The patient had been seen, evaluated, and treated conservatively in the   office with medication, activity modification, and injections.  The patient had   radiographic changes of bone-on-bone arthritis with endplate sclerosis and osteophytes noted.      The patient failed conservative measures including medication, injections, and activity modification, and at this point was ready for more definitive measures.   Based on the radiographic changes and failed conservative measures, the patient   decided to proceed with total knee replacement.  Risks of infection,   DVT, component  failure, need for revision surgery, postop course, and   expectations were all   discussed and reviewed.  Consent was obtained for benefit of pain   relief.      PROCEDURE IN DETAIL:  The patient was brought to the operative theater.   Once adequate anesthesia, preoperative antibiotics, 3 gm of Ancef (due to BMI) administered, the patient was positioned supine with the left thigh tourniquet placed.  The  left lower extremity was prepped and draped in sterile fashion.  A time-   out was performed identifying the patient, planned procedure, and   extremity.      The left lower extremity was placed in the [facility] leg holder.  The leg was   exsanguinated, tourniquet elevated to 300 mmHg.  A midline incision was   made followed by median parapatellar arthrotomy.  Following initial  exposure, attention was first directed to the patella.  Precut   measurement was noted to be 21 mm.  I resected down to 14 mm and used a   35 patellar button to restore patellar height as well as cover the cut   surface.      The lug holes were drilled and a metal shim was placed to protect the   patella from retractors and saw blades.      At this point, attention was now directed to the femur.  The femoral   canal was opened with a drill, irrigated to try to prevent fat emboli.  An   intramedullary rod was passed at 3 degrees valgus, 10 mm of bone was   resected off the distal femur.  Following this resection, the tibia was   subluxated anteriorly.  Using the extramedullary guide, 2 mm of bone was resected off   the proximal medial tibia.  We confirmed the gap would be   stable medially and laterally with a 10 mm insert as well as confirmed   the cut was perpendicular in the coronal plane, checking with an alignment rod.      Once this was done, I sized the femur to be a size 4 in the anterior-   posterior dimension, chose a narrow component based on medial and   lateral dimension.  The size 4 rotation block  was then pinned in   position anterior referenced using the C-clamp to set rotation.  The   anterior, posterior, and  chamfer cuts were made without difficulty nor   notching making certain that I was along the anterior cortex to help   with flexion gap stability.      The final box cut was made off the lateral aspect of distal femur.      At this point, the tibia was sized to be a size 4, the size 4 tray was   then pinned in position through the medial third of the tubercle,   drilled, and keel punched for the revision tibial tray selected due to her size.  Trial reduction was now carried with a 4N femur,  4 MBT revsion tibial tray, a 10 mm insert, and the 35 patella botton.  The knee was brought to   extension, full extension with good flexion stability with the patella   tracking through the trochlea without application of pressure.  Given   all these findings, the trial components removed.  Final components were   opened and cement was mixed.  The knee was irrigated with normal saline   solution and pulse lavage.  The synovial lining was   then injected with 0.25% Marcaine with epinephrine and 1 cc of Toradol,   total of 61 cc.      The knee was irrigated.  Final implants were then cemented onto clean and   dried cut surfaces of bone with the knee brought to extension with a 10 mm trial insert.      Once the cement had fully cured, the excess cement was removed   throughout the knee.  I confirmed I was satisfied with the range of   motion and stability, and the final 10 mm PS insert was chosen.  It was   placed into the knee.      The tourniquet had been let down at 34 minutes.  No significant   hemostasis required.  The   extensor mechanism was then reapproximated using #1 Vicryl and #0  V-lock sutures with the knee   in flexion.  The   remaining wound was closed with 2-0 Vicryl and running 4-0 Monocryl.   The knee was cleaned, dried, dressed sterilely using Dermabond and    Aquacel dressing.  The patient was then   brought to recovery room in stable condition, tolerating the procedure   well.   Please note that Physician Assistant, Lanney Gins, PA-C, was present for the entirety of the case, and was utilized for pre-operative positioning, peri-operative retractor management, general facilitation of the procedure.  He was also utilized for primary wound closure at the end of the case.              Madlyn Frankel Charlann Boxer, M.D.    04/23/2014 10:24 AM

## 2014-04-23 NOTE — Interval H&P Note (Signed)
History and Physical Interval Note:  04/23/2014 8:04 AM  Dominique PalmsElizabeth D Gavin  has presented today for surgery, with the diagnosis of left knee oa  The various methods of treatment have been discussed with the patient and family. After consideration of risks, benefits and other options for treatment, the patient has consented to  Procedure(s): LEFT TOTAL KNEE ARTHROPLASTY (Left) as a surgical intervention .  The patient's history has been reviewed, patient examined, no change in status, stable for surgery.  I have reviewed the patient's chart and labs.  Questions were answered to the patient's satisfaction.     Shelda PalLIN,Leslie Langille D

## 2014-04-23 NOTE — Progress Notes (Signed)
RT consult order noted for CPAP/BiPAP. Order already placed by PA-C.

## 2014-04-23 NOTE — Anesthesia Postprocedure Evaluation (Signed)
  Anesthesia Post-op Note  Patient: Dominique Robbins  Procedure(s) Performed: Procedure(s) (LRB): LEFT TOTAL KNEE ARTHROPLASTY (Left)  Patient Location: PACU  Anesthesia Type: Spinal  Level of Consciousness: awake and alert   Airway and Oxygen Therapy: Patient Spontanous Breathing  Post-op Pain: mild  Post-op Assessment: Post-op Vital signs reviewed, Patient's Cardiovascular Status Stable, Respiratory Function Stable, Patent Airway and No signs of Nausea or vomiting  Last Vitals:  Filed Vitals:   04/23/14 1222  BP: 121/46  Pulse: 61  Temp: 36.4 C  Resp: 15    Post-op Vital Signs: stable   Complications: No apparent anesthesia complications

## 2014-04-24 ENCOUNTER — Encounter (HOSPITAL_COMMUNITY): Payer: Self-pay | Admitting: Orthopedic Surgery

## 2014-04-24 LAB — GLUCOSE, CAPILLARY
GLUCOSE-CAPILLARY: 100 mg/dL — AB (ref 70–99)
Glucose-Capillary: 186 mg/dL — ABNORMAL HIGH (ref 70–99)
Glucose-Capillary: 110 mg/dL — ABNORMAL HIGH (ref 70–99)
Glucose-Capillary: 95 mg/dL (ref 70–99)

## 2014-04-24 LAB — BASIC METABOLIC PANEL
Anion gap: 11 (ref 5–15)
BUN: 22 mg/dL (ref 6–23)
CO2: 28 meq/L (ref 19–32)
CREATININE: 0.88 mg/dL (ref 0.50–1.10)
Calcium: 8.9 mg/dL (ref 8.4–10.5)
Chloride: 101 mEq/L (ref 96–112)
GFR calc Af Amer: 79 mL/min — ABNORMAL LOW (ref >90)
GFR, EST NON AFRICAN AMERICAN: 68 mL/min — AB (ref >90)
GLUCOSE: 168 mg/dL — AB (ref 70–99)
Potassium: 4.2 mEq/L (ref 3.7–5.3)
SODIUM: 140 meq/L (ref 137–147)

## 2014-04-24 LAB — CBC
HCT: 33.4 % — ABNORMAL LOW (ref 36.0–46.0)
HEMOGLOBIN: 10.5 g/dL — AB (ref 12.0–15.0)
MCH: 27.6 pg (ref 26.0–34.0)
MCHC: 31.4 g/dL (ref 30.0–36.0)
MCV: 87.9 fL (ref 78.0–100.0)
PLATELETS: 190 10*3/uL (ref 150–400)
RBC: 3.8 MIL/uL — AB (ref 3.87–5.11)
RDW: 14.3 % (ref 11.5–15.5)
WBC: 13 10*3/uL — ABNORMAL HIGH (ref 4.0–10.5)

## 2014-04-24 MED ORDER — POLYETHYLENE GLYCOL 3350 17 G PO PACK: 17.0000 g | PACK | Freq: Two times a day (BID) | ORAL | Status: DC

## 2014-04-24 MED ORDER — ASPIRIN 325 MG PO TBEC: 325.0000 mg | DELAYED_RELEASE_TABLET | Freq: Two times a day (BID) | ORAL | Status: AC

## 2014-04-24 MED ORDER — HYDROCODONE-ACETAMINOPHEN 7.5-325 MG PO TABS: 1.0000 | ORAL_TABLET | ORAL | Status: DC | PRN

## 2014-04-24 MED ORDER — FERROUS SULFATE 325 (65 FE) MG PO TABS: 325.0000 mg | ORAL_TABLET | Freq: Three times a day (TID) | ORAL | Status: DC

## 2014-04-24 MED ORDER — TIZANIDINE HCL 4 MG PO TABS: 4.0000 mg | ORAL_TABLET | Freq: Four times a day (QID) | ORAL | Status: AC | PRN

## 2014-04-24 MED ORDER — DSS 100 MG PO CAPS: 100.0000 mg | ORAL_CAPSULE | Freq: Two times a day (BID) | ORAL | Status: DC

## 2014-04-24 NOTE — Care Management Note (Addendum)
    Page 1 of 2   04/25/2014     1:51:36 PM CARE MANAGEMENT NOTE 04/25/2014  Patient:  Dominique Robbins, Dominique Robbins   Account Number:  [id number]  Date Initiated:  04/24/2014  Documentation initiated by:  [facility]  Subjective/Objective Assessment:   adm: Right  Total Knee Arthroplasty (Attune system)     Action/Plan:   discharge planning   Anticipated DC Date:  04/24/2014   Anticipated DC Plan:  [place]  CM consult      [facility] Choice  HOME HEALTH   Choice offered to / List presented to:  C-1 Patient   DME arranged  3-N-1  [place]      DME agency  [place] arranged  [place]      [place]   Status of service:  Completed, signed off Medicare Important Message given?   (If response is "NO", the following Medicare IM given date fields will be blank) Date Medicare IM given:   Medicare IM given by:   Date Additional Medicare IM given:   Additional Medicare IM given by:    Discharge Disposition:  [place]  Per UR Regulation:  Reviewed for med. necessity/level of care/duration of stay  If discussed at [place] of Stay Meetings, dates discussed:    Comments:  04/25/14 RN notified CM pt will need home oxygen.  CM spoke with pt for choice of vendor as she has [employer] for CPAP but she chooses [facility] to supply her oxygen.  CM called [place] DME rep to please deliver O2 to room prior to discharge.  CM called [facility] to please add on a [facility] for HHPT/RN.  No other CM needs were communicated.  Mariane Masters, BSN, Cm [phone].  04/24/14 07:00 CM met with pt in room to offer choice of home health agency.  Pt chooses [facility] to render HHPT. Address and contact information verified with pt.  Referral called to [facility]une Leeks for HHPT.  CM called [facility] DME rep to please deliver 3n1 and rolling walker to room prior to discharge.   No other CM needs were  communicated.  Mariane Masters, BSN, CM [phone].

## 2014-04-24 NOTE — Progress Notes (Signed)
Physical Therapy Treatment Patient Details Name: Dominique Robbins MRN: [id number] DOB: 11/15/1950 Today's Date: 04/24/2014    History of Present Illness Pt is a 63 year old female s/p L TKA.    PT Comments    Pt ambulated in hallway however limited by pain.  Pt also performed LE exercises.  Pt will need to perform steps prior to d/c however felt unable this morning due to pain.  Follow Up Recommendations  Home health PT     Equipment Recommendations  Rolling walker with 5" wheels    Recommendations for Other Services       Precautions / Restrictions Precautions Precautions: Knee Restrictions Other Position/Activity Restrictions: WBAT    Mobility  Bed Mobility Overal bed mobility: Needs Assistance Bed Mobility: Supine to Sit;Sit to Supine     Supine to sit: Min assist Sit to supine: Min assist   General bed mobility comments: verbal cues for technique and self assist  Transfers Overall transfer level: Needs assistance Equipment used: Rolling walker (2 wheeled) Transfers: Sit to/from Stand Sit to Stand: Min assist         General transfer comment: assist to rise, verbal cues for safe technique  Ambulation/Gait Ambulation/Gait assistance: Min guard Ambulation Distance (Feet): 100 Feet Assistive device: Rolling walker (2 wheeled) Gait Pattern/deviations: Step-to pattern;Antalgic;Decreased stance time - left Gait velocity: decr   General Gait Details: verbal cues for sequence, RW distance, posture, step length, distance limited by pain   Stairs            Wheelchair Mobility    Modified Rankin (Stroke Patients Only)       Balance                                    Cognition Arousal/Alertness: Awake/alert Behavior During Therapy: WFL for tasks assessed/performed Overall Cognitive Status: Within Functional Limits for tasks assessed                      Exercises Total Joint Exercises Ankle Circles/Pumps:  AROM;Both;15 reps Quad Sets: AROM;Both;15 reps Towel Squeeze: AROM;Both;15 reps Short Arc QuadBarbaraann Robbins: AAROM;Left;15 reps Heel Slides: AAROM;Left;15 reps Hip ABduction/ADduction: AROM;Left;15 reps Straight Leg Raises: AAROM;Left;10 reps    General Comments        Pertinent Vitals/Pain Pain Assessment: 0-10 Pain Score: 5  Pain Location: L knee Pain Descriptors / Indicators: Aching;Sore Pain Intervention(s): Limited activity within patient's tolerance;Monitored during session;Repositioned;Ice applied;Premedicated before session    Home Living                      Prior Function            PT Goals (current goals can now be found in the care plan section) Progress towards PT goals: Progressing toward goals    Frequency  7X/week    PT Plan Current plan remains appropriate    Co-evaluation             End of Session Equipment Utilized During Treatment: Gait belt Activity Tolerance: Patient limited by pain Patient left: with call bell/phone within reach;in bed     Time: 0934-1000 PT Time Calculation (min) (ACUTE ONLY): 26 min  Charges:  $Gait Training: 8-22 mins $Therapeutic Exercise: 8-22 mins                    G Codes:      Dominique Robbins,Dominique Robbins 04/24/2014,  1:30 PM Dominique JarredKati Bradford Robbins, PT, DPT 04/24/2014 Pager: [phone]

## 2014-04-24 NOTE — Progress Notes (Signed)
Physical Therapy Treatment Note    04/24/14 1600  PT Visit Information  Last PT Received On 04/24/14  Assistance Needed +1  History of Present Illness Pt is a 63 year old female s/p L TKA.  PT Time Calculation  PT Start Time (ACUTE ONLY) 1447  PT Stop Time (ACUTE ONLY) 1500  PT Time Calculation (min) (ACUTE ONLY) 13 min  Subjective Data  Subjective Pt ambulated in hallway again this afternoon howerver reporting more pain with ambulation and felt unable to practice stairs at this time.  Pt will likely d/c home tomorrow.  SpO2 dropped to 80% during ambulation however improved into 90's with breathing room air.  Pt reapplied her CPAP machine upon return to supine.  Precautions  Precautions Knee  Restrictions  Other Position/Activity Restrictions WBAT  Pain Assessment  Pain Assessment 0-10  Pain Score 7  Pain Location L knee  Pain Descriptors / Indicators Aching;Sore  Pain Intervention(s) Limited activity within patient's tolerance;Monitored during session;Repositioned;Ice applied  Cognition  Arousal/Alertness Awake/alert  Behavior During Therapy WFL for tasks assessed/performed  Overall Cognitive Status Within Functional Limits for tasks assessed  Bed Mobility  Overal bed mobility Needs Assistance  Bed Mobility Supine to Sit;Sit to Supine  Supine to sit Min assist  Sit to supine Min assist  General bed mobility comments verbal cues for technique and self assist, assist provided due to pain  Transfers  Overall transfer level Needs assistance  Equipment used Rolling walker (2 wheeled)  Transfers Sit to/from Stand  Sit to Stand Min guard  General transfer comment min/guard for safety, increased time and effort as pt not provided with assist  Ambulation/Gait  Ambulation/Gait assistance Min guard  Ambulation Distance (Feet) 100 Feet  Assistive device Rolling walker (2 wheeled)  Gait Pattern/deviations Step-to pattern;Antalgic;Decreased stance time - left  Gait velocity decr   General Gait Details verbal cues for sequence, RW distance, posture, step length, distance limited by pain, at least 2-3 standing rest breaks for pain and breathing as pt's SpO2 dropped to 80% room air during gait   PT - End of Session  Activity Tolerance Patient limited by pain  Patient left in bed;with call bell/phone within reach  PT - Assessment/Plan  PT Plan Current plan remains appropriate  PT Frequency (ACUTE ONLY) 7X/week  Follow Up Recommendations Home health PT  PT equipment Rolling walker with 5" wheels  PT Goal Progression  Progress towards PT goals Progressing toward goals  PT General Charges  $$ ACUTE PT VISIT 1 Procedure  PT Treatments  $Gait Training 8-22 mins   Zenovia JarredKati Ayush Boulet, PT, DPT 04/24/2014 Pager: [phone]

## 2014-04-24 NOTE — Evaluation (Signed)
Occupational Therapy Evaluation Patient Details Name: Dominique Robbins MRN: [id number] DOB: 03/05/1951 Today's Date: 04/24/2014    History of Present Illness Pt is a 63 year old female s/p L TKA.   Clinical Impression   Pt was admitted for L TKA. She will benefit from continued OT in acute to educate on bathroom transfers and further assess standard vs. Wide 3:1 commode.  Husband will assist with adls as needed, and pt plans to get herself a reacher. Pt was independent prior to admission.    Follow Up Recommendations  No OT follow up    Equipment Recommendations  3 in 1 bedside comode (? wide)    Recommendations for Other Services       Precautions / Restrictions Precautions Precautions: Knee Restrictions Other Position/Activity Restrictions: WBAT      Mobility Bed Mobility   Bed Mobility: Supine to Sit     Supine to sit: Min assist Sit to supine: Min assist   General bed mobility comments: cues for technique.  educated to cross RLE under L to assist  Transfers                 General transfer comment: not tested this session:  pt was min A with PT yesterday    Balance                                            ADL Overall ADL's : Needs assistance/impaired     Grooming: Sitting;Wash/dry hands;Wash/dry face;Set up   Upper Body Bathing: Set up;Sitting   Lower Body Bathing: Moderate assistance;Sit to/from stand   Upper Body Dressing : Minimal assistance;Sitting (iv)   Lower Body Dressing: Maximal assistance;Sit to/from stand                Pt performed ADL from EOB. Educated on and used Chief Executive Officerreacher and sock aide. She is planning to get a reacher.  Pt already has a long sponge at home.  Husband will assist as needed. Pt wanted to lie back down after ADL this morning.  Will return for bathroom transfers.      Vision                     Perception     Praxis      Pertinent Vitals/Pain Pain Assessment: 0-10 Pain  Score: 5  Pain Location: K knee Pain Descriptors / Indicators: Aching;Sore Pain Intervention(s): Limited activity within patient's tolerance;Monitored during session;Premedicated before session;Patient requesting pain meds-RN notified     Hand Dominance     Extremity/Trunk Assessment Upper Extremity Assessment Upper Extremity Assessment: Overall WFL for tasks assessed           Communication Communication Communication: No difficulties   Cognition Arousal/Alertness: Awake/alert Behavior During Therapy: WFL for tasks assessed/performed Overall Cognitive Status: Within Functional Limits for tasks assessed                     General Comments       Exercises       Shoulder Instructions      Home Living Family/patient expects to be discharged to:: Private residence Living Arrangements: Spouse/significant other   Type of Home: House             Bathroom Shower/Tub: Producer, television/film/videoWalk-in shower   Bathroom Toilet: Standard     Home Equipment: Cane - single  point          Prior Functioning/Environment Level of Independence: Independent             OT Diagnosis: Generalized weakness;Acute pain   OT Problem List: Decreased strength;Decreased activity tolerance;Decreased knowledge of use of DME or AE;Pain   OT Treatment/Interventions: Self-care/ADL training;DME and/or AE instruction;Patient/family education    OT Goals(Current goals can be found in the care plan section) Acute Rehab OT Goals Patient Stated Goal: get better and decreased pain OT Goal Formulation: With patient Time For Goal Achievement: 05/01/14 Potential to Achieve Goals: Good ADL Goals Pt Will Transfer to Toilet: with min guard assist;ambulating;bedside commode Pt Will Perform Toileting - Clothing Manipulation and hygiene: with supervision;sit to/from stand Pt Will Perform Tub/Shower Transfer: with min guard assist;ambulating;Shower transfer;3 in 1  OT Frequency: Min 2X/week   Barriers to  D/C:            Co-evaluation              End of Session    Activity Tolerance: Patient tolerated treatment well Patient left: in bed;with call bell/phone within reach   Time: 1610-96040744-0813 OT Time Calculation (min): 29 min Charges:  OT General Charges $OT Visit: 1 Procedure OT Evaluation $Initial OT Evaluation Tier I: 1 Procedure OT Treatments $Self Care/Home Management : 23-37 mins G-Codes:    Oleva Koo 04/24/2014, 8:26 AM  Marica OtterMaryellen Brittian Renaldo, OTR/L [phone] 04/24/2014

## 2014-04-24 NOTE — Plan of Care (Signed)
Problem: Phase I Progression Outcomes Goal: Hemodynamically stable Outcome: Progressing  Problem: Phase II Progression Outcomes Goal: Ambulates Outcome: Completed/Met Date Met:  04/24/14 Goal: Tolerating diet Outcome: Completed/Met Date Met:  04/24/14

## 2014-04-24 NOTE — Progress Notes (Signed)
Patient ID: Dominique Robbins, female   DOB: 12/11/1950, 63 y.o.   MRN: [id number] Subjective: 1 Day Post-Op Procedure(s) (LRB): LEFT TOTAL KNEE ARTHROPLASTY (Left)    Patient reports pain as moderate.  Doing OK, no events, ready to begin therapy  Objective:   VITALS:   Filed Vitals:   04/24/14 1400  BP: 117/41  Pulse: 77  Temp: 97.6 F (36.4 C)  Resp: 18    Neurovascular intact Incision: dressing C/D/I   Using CPAP at time of evaluation (compliance)  LABS  Recent Labs  04/24/14 0355  HGB 10.5*  HCT 33.4*  WBC 13.0*  PLT 190     Recent Labs  04/24/14 0355  NA 140  K 4.2  BUN 22  CREATININE 0.88  GLUCOSE 168*    No results for input(s): LABPT, INR in the last 72 hours.   Assessment/Plan: 1 Day Post-Op Procedure(s) (LRB): LEFT TOTAL KNEE ARTHROPLASTY (Left)   Advance diet Up with therapy Plan for discharge tomorrow Discharge home with home health if all progresses well with therapy today

## 2014-04-25 LAB — BASIC METABOLIC PANEL
ANION GAP: 11 (ref 5–15)
BUN: 26 mg/dL — AB (ref 6–23)
CALCIUM: 8.8 mg/dL (ref 8.4–10.5)
CO2: 29 meq/L (ref 19–32)
CREATININE: 0.94 mg/dL (ref 0.50–1.10)
Chloride: 102 mEq/L (ref 96–112)
GFR calc Af Amer: 73 mL/min — ABNORMAL LOW (ref >90)
GFR calc non Af Amer: 63 mL/min — ABNORMAL LOW (ref >90)
Glucose, Bld: 119 mg/dL — ABNORMAL HIGH (ref 70–99)
Potassium: 3.9 mEq/L (ref 3.7–5.3)
Sodium: 142 mEq/L (ref 137–147)

## 2014-04-25 LAB — CBC
HCT: 31.7 % — ABNORMAL LOW (ref 36.0–46.0)
Hemoglobin: 9.8 g/dL — ABNORMAL LOW (ref 12.0–15.0)
MCH: 27.7 pg (ref 26.0–34.0)
MCHC: 30.9 g/dL (ref 30.0–36.0)
MCV: 89.5 fL (ref 78.0–100.0)
PLATELETS: 161 10*3/uL (ref 150–400)
RBC: 3.54 MIL/uL — ABNORMAL LOW (ref 3.87–5.11)
RDW: 14.7 % (ref 11.5–15.5)
WBC: 9.8 10*3/uL (ref 4.0–10.5)

## 2014-04-25 LAB — GLUCOSE, CAPILLARY
GLUCOSE-CAPILLARY: 108 mg/dL — AB (ref 70–99)
Glucose-Capillary: 75 mg/dL (ref 70–99)

## 2014-04-25 NOTE — Progress Notes (Signed)
Physical Therapy Treatment Patient Details Name: Dominique Robbins MRN: [id number] DOB: 10/25/1950 Today's Date: 04/25/2014    History of Present Illness Pt is a 63 year old female s/p L TKA.    PT Comments    Pt ambulated and performed steps.  Pt with SpO2 80% room air after performing steps so performed saturation qualification test (see below).  Pt also performed exercises.  Pt reports hx of dizziness however states doctors unable to diagnose.  Pt had no further questions and plans to d/c home today.  SATURATION QUALIFICATIONS: (This note is used to comply with regulatory documentation for home oxygen)  Patient Saturations on Room Air at Rest = 91%  Patient Saturations on Room Air while Ambulating = 84%  Patient Saturations on 3 Liters of oxygen while Ambulating = 97%  Please briefly explain why patient needs home oxygen: to maintain oxygen saturations above 88% during functional tasks such as ambulation.   Follow Up Recommendations  Home health PT     Equipment Recommendations  Rolling walker with 5" wheels    Recommendations for Other Services       Precautions / Restrictions Precautions Precautions: Knee Restrictions Other Position/Activity Restrictions: WBAT    Mobility  Bed Mobility Overal bed mobility: Needs Assistance Bed Mobility: Supine to Sit;Sit to Supine     Supine to sit: Min guard Sit to supine: Min guard   General bed mobility comments: verbal cues for technique and self assist  Transfers Overall transfer level: Needs assistance Equipment used: Rolling walker (2 wheeled) Transfers: Sit to/from Stand Sit to Stand: Min guard         General transfer comment: min/guard for safety, increased time and effort as pt not provided with assist  Ambulation/Gait Ambulation/Gait assistance: Min guard Ambulation Distance (Feet): 90 Feet Assistive device: Rolling walker (2 wheeled) Gait Pattern/deviations: Step-to pattern Gait velocity: decr    General Gait Details: 50'x1, 40'x1 verbal cues for RW distance, posture, step length, distance limited by fatigue, ambulated twice as pt's SpO2 dropped to 80% after steps so performed ambulatory saturation test    Stairs Stairs: Yes Stairs assistance: Min guard Stair Management: Step to pattern;Forwards;With crutches;One rail Left Number of Stairs: 4 General stair comments: performed twice, verbal cues for sequence, safety, crutch placement  Wheelchair Mobility    Modified Rankin (Stroke Patients Only)       Balance                                    Cognition Arousal/Alertness: Awake/alert Behavior During Therapy: WFL for tasks assessed/performed Overall Cognitive Status: Within Functional Limits for tasks assessed                      Exercises Total Joint Exercises Ankle Circles/Pumps: AROM;Both;15 reps Quad Sets: AROM;Both;15 reps Short Arc QuadBarbaraann Boys: AAROM;Left;15 reps Heel Slides: AAROM;Left;15 reps Hip ABduction/ADduction: AROM;Left;15 reps Straight Leg Raises: AAROM;Left;10 reps    General Comments        Pertinent Vitals/Pain Pain Assessment: 0-10 Pain Score: 5  Pain Location: L knee Pain Descriptors / Indicators: Aching;Sore Pain Intervention(s): Limited activity within patient's tolerance;Monitored during session;Premedicated before session;Ice applied;Repositioned    Home Living                      Prior Function            PT Goals (current goals can now  be found in the care plan section) Progress towards PT goals: Progressing toward goals    Frequency  7X/week    PT Plan Current plan remains appropriate    Co-evaluation             End of Session Equipment Utilized During Treatment: Gait belt;Oxygen Activity Tolerance: Patient limited by fatigue Patient left: in bed;with call bell/phone within reach     Time: 6962-95280949-1031 PT Time Calculation (min) (ACUTE ONLY): 42 min  Charges:  $Gait Training:  23-37 mins $Therapeutic Exercise: 8-22 mins                    G Codes:      Eugina Row,KATHrine E 04/25/2014, 1:23 PM Zenovia JarredKati Lucille Witts, PT, DPT 04/25/2014 Pager: [phone]

## 2014-04-25 NOTE — Progress Notes (Signed)
SATURATION QUALIFICATIONS: (This note is used to comply with regulatory documentation for home oxygen)  Patient Saturations on Room Air at Rest = 90%  Patient Saturations on Room Air while Ambulating = 80%  Patient Saturations on 2 Liters of oxygen while Ambulating = 84%  Please briefly explain why patient needs home oxygen: Patient oxygenation drops with activity.

## 2014-04-25 NOTE — Progress Notes (Signed)
Occupational Therapy Treatment Patient Details Name: Dominique Robbins MRN: [id number] DOB: 08/08/1950 Today's Date: 04/25/2014    History of present illness Pt is a 63 year old female s/p L TKA.   OT comments  Pt is progressing with OT.  Did have spinning sensation and LOB twice during this session. Discussed possibly using 3:1 commode at bedside at night. Husband will walk behind her and guard her.  Used 02 as pt desaturated with PT earlier  Follow Up Recommendations  No OT follow up;Supervision/Assistance - 24 hour    Equipment Recommendations  3 in 1 bedside comode (wide)    Recommendations for Other Services      Precautions / Restrictions Precautions Precautions: Knee Restrictions Other Position/Activity Restrictions: WBAT       Mobility Bed Mobility         Supine to sit: Min assist Sit to supine: Min assist   General bed mobility comments: pt did support ankle with opposite foot; min A to assist trunk in getting up and support for leg when lying back down  Transfers   Equipment used: Rolling walker (2 wheeled) Transfers: Sit to/from Stand Sit to Stand: Min guard         General transfer comment: for safety:  pt with intermittent spinning, especially when ambulating    Balance                                   ADL Overall ADL's : Needs assistance/impaired     Grooming: Standing;Min guard                   Toilet Transfer: Minimal assistance;Ambulation;BSC;RW   Toileting- ArchitectClothing Manipulation and Hygiene: Min guard;Sit to/from stand   Tub/ Shower Transfer: Minimal assistance;Walk-in shower;Ambulation     General ADL Comments: pt has had vertigo intermittently for about 5 years:  she has been experiencing this since sx.  Got dizzy/spinning twice during OT and needed min A to recover balance.  Educated on possible compensation strategy of looking at something stationary.  Educated to stop and regain balance prior to  continuing walk.  Husband present and states he will walk behind her and guard her.  Practiced bathroom transfers with min A for safety.  Gave pt resource for vestibular rehab program at [facility] if symptoms persist, once she is healed enough to go over there.      Vision                     Perception     Praxis      Cognition   Behavior During Therapy: WFL for tasks assessed/performed Overall Cognitive Status: Within Functional Limits for tasks assessed                       Extremity/Trunk Assessment               Exercises     Shoulder Instructions       General Comments      Pertinent Vitals/ Pain       Pain Score: 7  (with shower transfer) Pain Location: L knee Pain Descriptors / Indicators: Aching Pain Intervention(s): Limited activity within patient's tolerance;Monitored during session;Repositioned;Ice applied  Home Living  Prior Functioning/Environment              Frequency Min 2X/week     Progress Toward Goals  OT Goals(current goals can now be found in the care plan section)  Progress towards OT goals: Progressing toward goals     Plan      Co-evaluation                 End of Session     Activity Tolerance Patient tolerated treatment well   Patient Left in chair;with call bell/phone within reach;with family/visitor present   Nurse Communication          Time: 2694-85461137-1200 OT Time Calculation (min): 23 min  Charges: OT General Charges $OT Visit: 1 Procedure OT Treatments $Self Care/Home Management : 23-37 mins  Sriya Kroeze 04/25/2014, 12:58 PM   Marica OtterMaryellen Lajuane Leatham, OTR/L [phone] 04/25/2014

## 2014-04-25 NOTE — Progress Notes (Signed)
     Subjective: 2 Days Post-Op Procedure(s) (LRB): LEFT TOTAL KNEE ARTHROPLASTY (Left)   Seen by Dr. Charlann Boxerlin. Patient reports pain as mild, pain controlled. No events throughout the night. Feeling better today. Ready to be discharged home.  Objective:   VITALS:   Filed Vitals:   04/25/14 0445  BP: 130/55  Pulse: 70  Temp: 98.4 F (36.9 C)  Resp: 20    Dorsiflexion/Plantar flexion intact Incision: dressing C/D/I No cellulitis present Compartment soft  LABS  Recent Labs  04/24/14 0355 04/25/14 0441  HGB 10.5* 9.8*  HCT 33.4* 31.7*  WBC 13.0* 9.8  PLT 190 161     Recent Labs  04/24/14 0355 04/25/14 0441  NA 140 142  K 4.2 3.9  BUN 22 26*  CREATININE 0.88 0.94  GLUCOSE 168* 119*     Assessment/Plan: 2 Days Post-Op Procedure(s) (LRB): LEFT TOTAL KNEE ARTHROPLASTY (Left) Up with therapy Discharge home with home health  [facility]. Follow up with OLIN,Reida Hem D in 2 weeks.  Contact information:  [facility] [address], [address] [place] [id number] [phone]        Anastasio AuerbachMatthew S. Tnya Ades   PAC  04/25/2014, 8:49 AM

## 2014-04-26 NOTE — Progress Notes (Signed)
Discharge summary sent to payer through [facility]  

## 2014-04-27 NOTE — Addendum Note (Signed)
Addended by: Abram SanderSIGMON, Greogory Cornette J on: 04/27/2014 01:29 PM   Modules accepted: Orders

## 2014-04-30 NOTE — Discharge Summary (Signed)
Physician Discharge Summary  Patient ID: Dominique Robbins MRN: [id number] DOB/AGE: 1950/07/17 63 y.o.  Admit date: 04/23/2014 Discharge date: 04/25/2014   Procedures:  Procedure(s) (LRB): LEFT TOTAL KNEE ARTHROPLASTY (Left)  Attending Physician:  Dr. Durene Romans   Admission Diagnoses:   Left knee primary OA / pain  Discharge Diagnoses:  Principal Problem:   S/P left TKA  Past Medical History  Diagnosis Date  . OSA on CPAP   . Allergic rhinitis, cause unspecified   . Vocal cord paralysis   . Bronchitis, asthmatic   . HTN (hypertension)   . Hyperlipidemia   . Diabetes mellitus   . Chronic bronchitis   . COPD (chronic obstructive pulmonary disease)     "uncertain of having this"  . Pneumonia     hx of years ago  . GERD (gastroesophageal reflux disease)   . Headache   . Arthritis     HPI: Dominique Robbins, 63 y.o. female, has a history of pain and functional disability in the left knee due to arthritis and has failed non-surgical conservative treatments for greater than 12 weeks to include NSAID's and/or analgesics, corticosteriod injections and activity modification. Onset of symptoms was gradual, starting 8+ years ago with gradually worsening course since that time. The patient noted prior procedures on the knee to include micro fracture on the left knee(s). Patient currently rates pain in the left knee(s) at 9 out of 10 with activity. Patient has night pain, worsening of pain with activity and weight bearing, pain that interferes with activities of daily living, pain with passive range of motion, crepitus and joint swelling. Patient has evidence of periarticular osteophytes and joint space narrowing by imaging studies. There is no active infection. Risks, benefits and expectations were discussed with the patient. Risks including but not limited to the risk of anesthesia, blood clots, nerve damage, blood vessel damage, failure of the prosthesis, infection and up to  and including death. Patient understand the risks, benefits and expectations and wishes to proceed with surgery.  PCP: RECORD,CHARLES   Discharged Condition: good  Hospital Course:  Patient underwent the above stated procedure on 04/23/2014. Patient tolerated the procedure well and brought to the recovery room in good condition and subsequently to the floor.  POD #1 BP: 117/41 ; Pulse: 77 ; Temp: 97.6 F (36.4 C) ; Resp: 18 Patient reports pain as moderate. Doing OK, no events, ready to begin therapy. Dorsiflexion/plantar flexion intact, incision: dressing C/D/I, no cellulitis present and compartment soft.   LABS  Basename    HGB  10.5  HCT  33.4   POD #2  BP: 130/55 ; Pulse: 70 ; Temp: 98.4 F (36.9 C) ; Resp: 20 Patient reports pain as mild, pain controlled. No events throughout the night. Feeling better today. Ready to be discharged home. Dorsiflexion/plantar flexion intact, incision: dressing C/D/I, no cellulitis present and compartment soft.   LABS  Basename    HGB  9.8  HCT  31.7    Discharge Exam: General appearance: alert, cooperative and no distress Extremities: Homans sign is negative, no sign of DVT, no edema, redness or tenderness in the calves or thighs and no ulcers, gangrene or trophic changes  Disposition: Home with follow up in 2 weeks   Follow-up Information    Follow up with [facility].   Why:  rolling walker and 3n1 (commode) and home oxygen   Contact information:   [address] [place] [address] [phone]  Follow up with [facility].   Why:  home health physical therapy and nurse   Contact information:   [address] [place] [address] [phone]       Follow up with Shelda Pal, MD. Schedule an appointment as soon as possible for a visit in 2 weeks.   Specialty:  Orthopedic Surgery   Contact information:   [address] [place]  [address] [phone]       Discharge Instructions    Call MD / Call 911    Complete by:  As directed   If you experience chest pain or shortness of breath, CALL 911 and be transported to the hospital emergency room.  If you develope a fever above 101 F, pus (white drainage) or increased drainage or redness at the wound, or calf pain, call your surgeon's office.     Change dressing    Complete by:  As directed   Maintain surgical dressing for 10-14 days, or until follow up in the clinic.     Constipation Prevention    Complete by:  As directed   Drink plenty of fluids.  Prune juice may be helpful.  You may use a stool softener, such as Colace (over the counter) 100 mg twice a day.  Use MiraLax (over the counter) for constipation as needed.     Diet - low sodium heart healthy    Complete by:  As directed      Discharge instructions    Complete by:  As directed   Maintain surgical dressing for 10-14 days, or until follow up in the clinic. Follow up in 2 weeks at [facility]. Call with any questions or concerns.     Increase activity slowly as tolerated    Complete by:  As directed      TED hose    Complete by:  As directed   Use stockings (TED hose) for 2 weeks on both leg(s).  You may remove them at night for sleeping.     Weight bearing as tolerated    Complete by:  As directed   Laterality:  left  Extremity:  Lower             Medication List    STOP taking these medications        HYDROcodone-acetaminophen 10-325 MG per tablet  Commonly known as:  NORCO  Replaced by:  HYDROcodone-acetaminophen 7.5-325 MG per tablet     meloxicam 15 MG tablet  Commonly known as:  MOBIC     metaxalone 800 MG tablet  Commonly known as:  SKELAXIN      TAKE these medications        albuterol (2.5 MG/3ML) 0.083% nebulizer solution  Commonly known as:  PROVENTIL  Take 3 mLs (2.5 mg total) by nebulization every 6 (six) hours as needed for wheezing or shortness of breath.      albuterol 108 (90 BASE) MCG/ACT inhaler  Commonly known as:  PROAIR HFA  Inhale 1-2 puffs into the lungs every 6 (six) hours as needed for wheezing or shortness of breath.     aspirin 325 MG EC tablet  Take 1 tablet (325 mg total) by mouth 2 (two) times daily.     azelastine 0.1 % nasal spray  Commonly known as:  ASTELIN  Place 1 spray into the nose 2 (two) times daily. Use in each nostril as directed     Beclomethasone Dipropionate 80 MCG/ACT Aers  Commonly known  as:  QNASL  Place 1 spray into the nose daily.     busPIRone 15 MG tablet  Commonly known as:  BUSPAR  Take 1 tablet by mouth Twice daily.     carbamazepine 200 MG tablet  Commonly known as:  TEGRETOL  Take 200 mg by mouth 2 (two) times daily.     CRESTOR 20 MG tablet  Generic drug:  rosuvastatin  Take 20 mg by mouth every morning.     cyanocobalamin 1000 MCG/ML injection  Commonly known as:  (VITAMIN B-12)  Inject 1,000 mcg into the muscle every 30 (thirty) days. Mid-month     diazepam 5 MG tablet  Commonly known as:  VALIUM  Take 5 mg by mouth at bedtime as needed for sedation.     DSS 100 MG Caps  Take 100 mg by mouth 2 (two) times daily.  Notes to Patient:  Colace- stool softner     ferrous sulfate 325 (65 FE) MG tablet  Take 1 tablet (325 mg total) by mouth 3 (three) times daily after meals.     fluticasone 50 MCG/ACT nasal spray  Commonly known as:  FLONASE  Place 1-2 sprays into the nose at bedtime.     gabapentin 300 MG capsule  Commonly known as:  NEURONTIN  Take 600 mg by mouth 2 (two) times daily.     glimepiride 2 MG tablet  Commonly known as:  AMARYL  Take 2 mg by mouth daily before breakfast.     HYDROcodone-acetaminophen 7.5-325 MG per tablet  Commonly known as:  NORCO  Take 1-2 tablets by mouth every 4 (four) hours as needed for moderate pain.     hyoscyamine 0.125 MG tablet  Commonly known as:  LEVSIN, ANASPAZ  Take 0.125 mg by mouth every 6 (six) hours as needed.      ipratropium 0.06 % nasal spray  Commonly known as:  ATROVENT  Place 2 sprays into the nose 4 (four) times daily. If needed for drainage     ipratropium 17 MCG/ACT inhaler  Commonly known as:  ATROVENT HFA  Inhale 2 puffs into the lungs 4 (four) times daily. As needed for wheeze, cough, chest tightness     levalbuterol 45 MCG/ACT inhaler  Commonly known as:  XOPENEX HFA  Inhale 1-2 puffs into the lungs every 6 (six) hours as needed for wheezing or shortness of breath.     loratadine 10 MG tablet  Commonly known as:  CLARITIN  Take 10 mg by mouth every morning.     losartan-hydrochlorothiazide 100-25 MG per tablet  Commonly known as:  HYZAAR  Take 1 tablet by mouth daily.     losartan-hydrochlorothiazide 50-12.5 MG per tablet  Commonly known as:  HYZAAR  Take 1 tablet by mouth every morning.     meclizine 25 MG tablet  Commonly known as:  ANTIVERT  Take 25 mg by mouth 3 (three) times daily as needed for dizziness.     metFORMIN 1000 MG tablet  Commonly known as:  GLUCOPHAGE  Take 1,000 mg by mouth 2 (two) times daily.     mometasone-formoterol 200-5 MCG/ACT Aero  Commonly known as:  DULERA  Inhale 2 puffs into the lungs 2 (two) times daily.     montelukast 10 MG tablet  Commonly known as:  SINGULAIR  Take 1 tablet (10 mg total) by mouth daily.     NEXIUM 40 MG capsule  Generic drug:  esomeprazole  Take 1 capsule by mouth every morning.  polyethylene glycol packet  Commonly known as:  MIRALAX / GLYCOLAX  Take 17 g by mouth 2 (two) times daily.     polyvinyl alcohol 1.4 % ophthalmic solution  Commonly known as:  LIQUIFILM TEARS  Place 1 drop into both eyes 2 (two) times daily as needed for dry eyes.     PRESCRIPTION MEDICATION  Inject 1 each into the skin every 14 (fourteen) days. Allergy shot.     promethazine-codeine 6.25-10 MG/5ML syrup  Commonly known as:  PHENERGAN with CODEINE  Take 5 mLs by mouth every 6 (six) hours as needed for cough.     sertraline  100 MG tablet  Commonly known as:  ZOLOFT  Take 200 mg by mouth every morning.     tiotropium 18 MCG inhalation capsule  Commonly known as:  SPIRIVA  Place 1 capsule (18 mcg total) into inhaler and inhale daily.     tiZANidine 4 MG tablet  Commonly known as:  ZANAFLEX  Take 1 tablet (4 mg total) by mouth every 6 (six) hours as needed for muscle spasms.     Vitamin D (Ergocalciferol) 50000 UNITS Caps capsule  Commonly known as:  DRISDOL  Take 1 capsule by mouth once a week. Monday     ZETONNA 37 MCG/ACT Aers  Generic drug:  Ciclesonide  2 sprays daily.         Signed: Anastasio AuerbachMatthew S. Abass Misener   PA-C  04/30/2014, 2:29 PM

## 2014-05-04 ENCOUNTER — Telehealth: Payer: Self-pay | Admitting: Internal Medicine

## 2014-05-04 DIAGNOSIS — J45909 Unspecified asthma, uncomplicated: Secondary | ICD-10-CM

## 2014-05-04 NOTE — Telephone Encounter (Signed)
lmtcb X1 to make pt aware.  Labs ordered.

## 2014-05-04 NOTE — Telephone Encounter (Signed)
Spoke with CY patient will need to come by the office for STAT BMET to be done prior to CT Chest date. Thanks.

## 2014-05-04 NOTE — Telephone Encounter (Signed)
Pt is scheduled for CT w/ contrast on 05/14/14. Pt wants to know if she will need blood work done prior to getting this done. Please advise thanks

## 2014-05-07 NOTE — Telephone Encounter (Signed)
Called and spoke with pt and she is aware of lab order placed in the computer and she will come by Wednesday for this.  Nothing further is needed.

## 2014-05-14 ENCOUNTER — Ambulatory Visit (INDEPENDENT_AMBULATORY_CARE_PROVIDER_SITE_OTHER)
Admission: RE | Admit: 2014-05-14 | Discharge: 2014-05-14 | Disposition: A | Discharge status: Home / Self Care | Payer: BC Managed Care – PPO | Source: Ambulatory Visit | Attending: Internal Medicine | Admitting: Internal Medicine

## 2014-05-14 ENCOUNTER — Other Ambulatory Visit (INDEPENDENT_AMBULATORY_CARE_PROVIDER_SITE_OTHER): Payer: BC Managed Care – PPO

## 2014-05-14 DIAGNOSIS — R911 Solitary pulmonary nodule: Secondary | ICD-10-CM

## 2014-05-14 DIAGNOSIS — J45909 Unspecified asthma, uncomplicated: Secondary | ICD-10-CM

## 2014-05-14 LAB — BASIC METABOLIC PANEL
BUN: 15 mg/dL (ref 6–23)
CO2: 30 mEq/L (ref 19–32)
Calcium: 9.8 mg/dL (ref 8.4–10.5)
Chloride: 100 mEq/L (ref 96–112)
Creatinine, Ser: 0.9 mg/dL (ref 0.4–1.2)
GFR: 67.68 mL/min (ref >60.00)
GLUCOSE: 150 mg/dL — AB (ref 70–99)
Potassium: 3.5 mEq/L (ref 3.5–5.1)
SODIUM: 140 meq/L (ref 135–145)

## 2014-05-14 MED ORDER — IOHEXOL 300 MG/ML  SOLN
80.0000 mL | Freq: Once | INTRAMUSCULAR | Status: AC | PRN
  Administered 2014-05-14: 80 mL via INTRAVENOUS

## 2014-05-14 NOTE — Progress Notes (Signed)
Chart was accessed for Dr. Lyman BishopLawrence. He needed to see [facility]. He did not know how to access. Report was reviewed by myself and him.

## 2014-05-16 ENCOUNTER — Telehealth: Payer: Self-pay | Admitting: Pulmonary Disease

## 2014-05-16 ENCOUNTER — Telehealth: Payer: Self-pay | Admitting: Internal Medicine

## 2014-05-16 MED ORDER — PROMETHAZINE-CODEINE 6.25-10 MG/5ML PO SYRP: 5.0000 mL | ORAL_SOLUTION | Freq: Four times a day (QID) | ORAL | Status: DC | PRN

## 2014-05-16 NOTE — Telephone Encounter (Signed)
Called and lmom to make the pt aware that the rx for the cough med has been called to the pharmacy.  Nothing further is needed.

## 2014-05-16 NOTE — Telephone Encounter (Signed)
Called and spoke with pt and she is requesting that the promethazine with codeine cough syrup be called in to the pharmacy for her.  She stated that every time the weather changes she gets this congestion and cough.  CY please advise if ok to call in for the pt.  Thanks  Last ov--12/06/14 Next ov--06/07/14  No Known Allergies  Current Outpatient Prescriptions on File Prior to Visit  Medication Sig Dispense Refill  . albuterol (PROAIR HFA) 108 (90 BASE) MCG/ACT inhaler Inhale 1-2 puffs into the lungs every 6 (six) hours as needed for wheezing or shortness of breath. 1 Inhaler 3  . albuterol (PROVENTIL) (2.5 MG/3ML) 0.083% nebulizer solution Take 3 mLs (2.5 mg total) by nebulization every 6 (six) hours as needed for wheezing or shortness of breath. 75 mL prn  . aspirin EC 325 MG EC tablet Take 1 tablet (325 mg total) by mouth 2 (two) times daily. 60 tablet 0  . azelastine (ASTELIN) 137 MCG/SPRAY nasal spray Place 1 spray into the nose 2 (two) times daily. Use in each nostril as directed    . Beclomethasone Dipropionate (QNASL) 80 MCG/ACT AERS Place 1 spray into the nose daily. (Patient taking differently: Place 1 spray into the nose every morning. ) 1 Inhaler prn  . busPIRone (BUSPAR) 15 MG tablet Take 1 tablet by mouth Twice daily.    . carbamazepine (TEGRETOL) 200 MG tablet Take 200 mg by mouth 2 (two) times daily.    . Ciclesonide (ZETONNA) 37 MCG/ACT AERS 2 sprays daily.    . CRESTOR 20 MG tablet Take 20 mg by mouth every morning.     . cyanocobalamin (,VITAMIN B-12,) 1000 MCG/ML injection Inject 1,000 mcg into the muscle every 30 (thirty) days. Mid-month    . diazepam (VALIUM) 5 MG tablet Take 5 mg by mouth at bedtime as needed for sedation.     . docusate sodium 100 MG CAPS Take 100 mg by mouth 2 (two) times daily. 10 capsule 0  . ferrous sulfate 325 (65 FE) MG tablet Take 1 tablet (325 mg total) by mouth 3 (three) times daily after meals.  3  . fluticasone (FLONASE) 50 MCG/ACT nasal spray  Place 1-2 sprays into the nose at bedtime. 16 g 6  . gabapentin (NEURONTIN) 300 MG capsule Take 600 mg by mouth 2 (two) times daily.     Marland Kitchen. glimepiride (AMARYL) 2 MG tablet Take 2 mg by mouth daily before breakfast.    . HYDROcodone-acetaminophen (NORCO) 7.5-325 MG per tablet Take 1-2 tablets by mouth every 4 (four) hours as needed for moderate pain. 100 tablet 0  . hyoscyamine (LEVSIN, ANASPAZ) 0.125 MG tablet Take 0.125 mg by mouth every 6 (six) hours as needed.    Marland Kitchen. ipratropium (ATROVENT HFA) 17 MCG/ACT inhaler Inhale 2 puffs into the lungs 4 (four) times daily. As needed for wheeze, cough, chest tightness 1 Inhaler prn  . ipratropium (ATROVENT) 0.06 % nasal spray Place 2 sprays into the nose 4 (four) times daily. If needed for drainage 15 mL prn  . levalbuterol (XOPENEX HFA) 45 MCG/ACT inhaler Inhale 1-2 puffs into the lungs every 6 (six) hours as needed for wheezing or shortness of breath. 1 Inhaler prn  . loratadine (CLARITIN) 10 MG tablet Take 10 mg by mouth every morning.     Marland Kitchen. losartan-hydrochlorothiazide (HYZAAR) 100-25 MG per tablet Take 1 tablet by mouth daily.    Marland Kitchen. losartan-hydrochlorothiazide (HYZAAR) 50-12.5 MG per tablet Take 1 tablet by mouth every morning.     .Marland Kitchen  meclizine (ANTIVERT) 25 MG tablet Take 25 mg by mouth 3 (three) times daily as needed for dizziness.    . metFORMIN (GLUCOPHAGE) 1000 MG tablet Take 1,000 mg by mouth 2 (two) times daily.    . mometasone-formoterol (DULERA) 200-5 MCG/ACT AERO Inhale 2 puffs into the lungs 2 (two) times daily. 1 Inhaler prn  . montelukast (SINGULAIR) 10 MG tablet Take 1 tablet (10 mg total) by mouth daily. (Patient taking differently: Take 10 mg by mouth every morning. ) 90 tablet 3  . NEXIUM 40 MG capsule Take 1 capsule by mouth every morning.     . polyethylene glycol (MIRALAX / GLYCOLAX) packet Take 17 g by mouth 2 (two) times daily. 14 each 0  . polyvinyl alcohol (LIQUIFILM TEARS) 1.4 % ophthalmic solution Place 1 drop into both eyes 2  (two) times daily as needed for dry eyes.    Marland Kitchen. PRESCRIPTION MEDICATION Inject 1 each into the skin every 14 (fourteen) days. Allergy shot.    . promethazine-codeine (PHENERGAN WITH CODEINE) 6.25-10 MG/5ML syrup Take 5 mLs by mouth every 6 (six) hours as needed for cough. 240 mL 0  . sertraline (ZOLOFT) 100 MG tablet Take 200 mg by mouth every morning.     . tiotropium (SPIRIVA) 18 MCG inhalation capsule Place 1 capsule (18 mcg total) into inhaler and inhale daily. (Patient taking differently: Place 18 mcg into inhaler and inhale every morning. ) 90 capsule 3  . tiZANidine (ZANAFLEX) 4 MG tablet Take 1 tablet (4 mg total) by mouth every 6 (six) hours as needed for muscle spasms. 40 tablet 0  . Vitamin D, Ergocalciferol, (DRISDOL) 50000 UNITS CAPS Take 1 capsule by mouth once a week. Monday     No current facility-administered medications on file prior to visit.

## 2014-05-16 NOTE — Telephone Encounter (Signed)
Patient states she went to pharmacy, and was told they never received message for script for phenergan with codeine.    Script called in again.

## 2014-05-16 NOTE — Telephone Encounter (Signed)
Ok prometh codeine 200 ml,  5 ml every 6 hours if needed for cough

## 2014-05-16 NOTE — Telephone Encounter (Signed)
Pt states she is returning call - [phone]

## 2014-06-07 ENCOUNTER — Encounter: Payer: Self-pay | Admitting: Internal Medicine

## 2014-06-07 ENCOUNTER — Ambulatory Visit (INDEPENDENT_AMBULATORY_CARE_PROVIDER_SITE_OTHER): Payer: BLUE CROSS/BLUE SHIELD | Admitting: Internal Medicine

## 2014-06-07 VITALS — BP 110/62 | HR 81 | Ht 66.5 in | Wt 270.2 lb

## 2014-06-07 DIAGNOSIS — IMO0001 Reserved for inherently not codable concepts without codable children: Secondary | ICD-10-CM

## 2014-06-07 DIAGNOSIS — R911 Solitary pulmonary nodule: Secondary | ICD-10-CM

## 2014-06-07 DIAGNOSIS — J383 Other diseases of vocal cords: Secondary | ICD-10-CM

## 2014-06-07 DIAGNOSIS — J479 Bronchiectasis, uncomplicated: Secondary | ICD-10-CM

## 2014-06-07 DIAGNOSIS — J4520 Mild intermittent asthma, uncomplicated: Secondary | ICD-10-CM

## 2014-06-07 MED ORDER — FLUTTER DEVI: Status: AC

## 2014-06-07 MED ORDER — PROMETHAZINE-CODEINE 6.25-10 MG/5ML PO SYRP: 5.0000 mL | ORAL_SOLUTION | Freq: Four times a day (QID) | ORAL | Status: DC | PRN

## 2014-06-07 NOTE — Progress Notes (Signed)
Patient ID: Dominique Robbins, female    DOB: 04/09/1951, 64 y.o.   MRN: [id number]  HPI 10/16/10- 89 yo never smoker followed for asthma, allergic rhinitis, OSA, vocal cord paresis/ hoarseness. Last here June 02, 2010. PFT then reviewd FEV1 2.29/76% and IgE 86.3.  Since then no real change "pretty good" bothered by humidity and oak tree pollen. Some silent reflux- on Nexium but no aspirational choke while eating. Had failed Astelin in past but used some antihistamines. Uses Flonase  CPAP compliance all night every night 11 cwp.  Spiriva helps but doesn't last 12 hours.   05/11/11-  64 yo never smoker followed for asthma, allergic rhinitis, OSA, vocal cord paresis/ hoarseness. Has had flu vaccine. No major problems with her breathing since last here. Remains comfortable with CPAP 11 CWP. Nexium controls heartburn symptoms. This season is not associated with significant rhinitis complaints. There has been no change in chronic hoarseness after placement of vocal cord stent.   05/06/12- 64 yo never smoker followed for asthma, allergic rhinitis, OSA, vocal cord paresis/ hoarseness. FOLLOWS FOR: wears CPAP 11/ [facility] every night for about 8 hours and pressure working well for patient;nasal and chest congestion, cough-unable to breathe when coughing that hard She blames weather change or a recent cold. Had flu vaccine. Residual dry cough without much wheezing.  11/10/12- 64 yo never smoker followed for asthma, allergic rhinitis, OSA, vocal cord paresis/ hoarseness. FOLLOWS FOR: wears CPAP 11/ [facility] every night for about 8 hours and pressure doing well; not due for any supplies at this time Voice is doing better. Occasional cough and needs to keep cough syrup. Little wheeze. Less use of her nebulizer machine. We reviewed medications.  05/08/13- 64 yo never smoker followed for asthma, allergic rhinitis, OSA, vocal cord paresis/ hoarseness. FOLLOWS FOR: DME is [facility]-Wears CPAP 11 every night for  at least 8 hours; pressure working well for patient. Breathing okay but needs refill of cough syrup. Wants to restart Spiriva and Singulair. Denies reflux or cough with meals. Bothersome cough and tightness.  12/05/13- 64 yo never smoker followed for asthma, allergic rhinitis,chronic cough,  OSA, vocal cord paresis/ hoarseness. FOLLOW FOR:  Wearing CPAP 6-8 hours per night.  Still having non-productive and request rx for cough syrup Saw no change after stopping Singulair. Continue Spiriva. Chronic cough and hoarseness from vocal cords CPAP is helpful and used all night every night.  06/07/14- 64 yo never smoker followed for asthma/ bronchiectasis, allergic rhinitis,chronic cough,  OSA, vocal cord paresis/ hoarseness, lung nodule FOLLOWS FOR: Wears CPAP  11 every night through [facility]. Continues to have cough and wants cough syrup Rx. Less hoarse recently. CT chest 05/14/14  Reviewed with her IMPRESSION: 1. 3 mm right middle lobe nodule as described on the report of the prior CT from September[facility]. No pulmonary nodules elsewhere in either lung. Please see below for followup recommendations. 2. Mild bronchiectasis involving the posterior basal segment of the right lower lobe, with several of the bronchi filled with mucus. No evidence of bronchiectasis elsewhere. 3. Mild changes of COPD/emphysema. No acute cardiopulmonary disease. 4. Hepatic steatosis. If the patient is at high risk for bronchogenic carcinoma, follow-up chest CT at 1 year is recommended. If the patient is at low risk, no follow-up is needed. This recommendation follows the consensus statement: Guidelines for Management of Small Pulmonary Nodules Detected on CT Scans: A Statement from the Fleischner Society as published in Radiology 2005; 237:395-400. Electronically Signed  By: Hulan Saashomas Lawrence  M.D.  On: 05/14/2014 10:03  Review of Systems- see HPI Constitutional:   No-   weight loss, night sweats, fevers,  chills, fatigue, lassitude. HEENT:   No-  headaches, difficulty swallowing, tooth/dental problems, sore throat,       No-  sneezing, itching, ear ache, nasal congestion, post nasal drip,  CV:  No-   chest pain, orthopnea, PND, swelling in lower extremities, anasarca, dizziness, palpitations Resp: No-   shortness of breath with exertion or at rest.              No-   productive cough,  + non-productive cough,  No- coughing up of blood.              No-   change in color of mucus.  + wheezing.   Skin: No-   rash or lesions. GI:  No-   heartburn, indigestion, abdominal pain, nausea, vomiting,  GU MS:  Neuro-     nothing unusual Psych:  No- change in mood or affect. No depression or anxiety.  No memory loss.    Objective:   Physical Exam General- Alert, Oriented, Affect-appropriate, Distress- none acute, +obese Skin- rash-none, lesions- none, excoriation- none Lymphadenopathy- none Head- atraumatic            Eyes- Gross vision intact, PERRLA, conjunctivae clear secretions            Ears- Hearing, canals-normal            Nose- Clear, no-Septal dev, mucus, polyps, erosion, perforation.              Throat- Mallampati IV , mucosa clear , drainage- none, tonsils- atrophic, + hoarse,                 Neck- flexible , trachea midline, no stridor , thyroid nl, carotid no bruit Chest - symmetrical excursion , unlabored           Heart/CV- RRR , no murmur , no gallop  , no rub, nl s1 s2                           - JVD- none , edema- none, stasis changes- none, varices- none           Lung- clear to P&A, wheeze- none, cough+ dry , dullness-none, rub- none           Chest wall-  Abd- Br/ Gen/ Rectal- Not done, not indicated Extrem- cyanosis- none, clubbing, none, atrophy- none, strength- nl. +Cane Neuro- grossly intact to observation

## 2014-06-07 NOTE — Patient Instructions (Addendum)
Script printed for Flutter Device to help clear mucus from airways      Blow through 4 times, repeat three or four times daily when needed   We can continue CPAP 11/ [facility]  Script for cough syrup printed  Please call as needed

## 2014-06-09 DIAGNOSIS — R911 Solitary pulmonary nodule: Secondary | ICD-10-CM | POA: Insufficient documentation

## 2014-06-09 DIAGNOSIS — J479 Bronchiectasis, uncomplicated: Secondary | ICD-10-CM | POA: Insufficient documentation

## 2014-06-09 DIAGNOSIS — IMO0001 Reserved for inherently not codable concepts without codable children: Secondary | ICD-10-CM | POA: Insufficient documentation

## 2014-06-09 NOTE — Assessment & Plan Note (Signed)
Very low risk in this never smoker and stable since September 2015. We reviewed the images and discussed the radiologist recommendation, feeling she qualifies for incidental follow-up only.

## 2014-06-09 NOTE — Assessment & Plan Note (Signed)
Symptomatic management as required.

## 2014-06-09 NOTE — Assessment & Plan Note (Signed)
Doing better currently, with less throat clearing and less hoarseness

## 2014-06-09 NOTE — Assessment & Plan Note (Signed)
Right lower lobe bronchiectasis with mucus plugging almost certainly reflects intermittent mild aspiration related to her vocal cord dysfunction.

## 2014-07-17 ENCOUNTER — Telehealth: Payer: Self-pay | Admitting: Internal Medicine

## 2014-07-17 MED ORDER — PROMETHAZINE-CODEINE 6.25-10 MG/5ML PO SYRP: 5.0000 mL | ORAL_SOLUTION | Freq: Four times a day (QID) | ORAL | Status: DC | PRN

## 2014-07-17 NOTE — Telephone Encounter (Signed)
Refill for Promethazine cough syrup called into pharmacy. Pt aware.  Nothing further needed.

## 2014-07-17 NOTE — Telephone Encounter (Signed)
Last OV 06/07/14 Pending OV 01/07/15 Last refill 06/07/14 #[id number]  CY - please advise on refill. Thanks.

## 2014-07-17 NOTE — Telephone Encounter (Signed)
Ok to refill 

## 2014-12-11 ENCOUNTER — Telehealth: Payer: Self-pay | Admitting: Internal Medicine

## 2014-12-11 MED ORDER — TIOTROPIUM BROMIDE MONOHYDRATE 18 MCG IN CAPS: 18.0000 ug | ORAL_CAPSULE | Freq: Every day | RESPIRATORY_TRACT | Status: DC

## 2014-12-11 MED ORDER — PROMETHAZINE-CODEINE 6.25-10 MG/5ML PO SYRP: 5.0000 mL | ORAL_SOLUTION | Freq: Four times a day (QID) | ORAL | Status: DC | PRN

## 2014-12-11 NOTE — Telephone Encounter (Signed)
Called spoke with pt. She is requesting refill on spiriva HH and phenergan w/ codeine cough syrup. spiriva is not on pt current medication list but reports she takes this daily and has been. Cough syrup last refilled 07/17/14 #200 ml x  0 refills  Take 5 mLs by mouth every 6 (six) hours as needed for cough  Please advise Dr. Maple Hudson thanks  No Known Allergies   Current Outpatient Prescriptions on File Prior to Visit  Medication Sig Dispense Refill  . albuterol (PROAIR HFA) 108 (90 BASE) MCG/ACT inhaler Inhale 1-2 puffs into the lungs every 6 (six) hours as needed for wheezing or shortness of breath. 1 Inhaler 3  . albuterol (PROVENTIL) (2.5 MG/3ML) 0.083% nebulizer solution Take 3 mLs (2.5 mg total) by nebulization every 6 (six) hours as needed for wheezing or shortness of breath. 75 mL prn  . azelastine (ASTELIN) 137 MCG/SPRAY nasal spray Place 1 spray into the nose 2 (two) times daily. Use in each nostril as directed    . Beclomethasone Dipropionate (QNASL) 80 MCG/ACT AERS Place 1 spray into the nose daily. (Patient taking differently: Place 1 spray into the nose every morning. ) 1 Inhaler prn  . busPIRone (BUSPAR) 15 MG tablet Take 1 tablet by mouth Twice daily.    . carbamazepine (TEGRETOL) 200 MG tablet Take 200 mg by mouth 2 (two) times daily.    . Ciclesonide (ZETONNA) 37 MCG/ACT AERS 2 sprays daily.    . CRESTOR 20 MG tablet Take 20 mg by mouth every morning.     . cyanocobalamin (,VITAMIN B-12,) 1000 MCG/ML injection Inject 1,000 mcg into the muscle every 30 (thirty) days. Mid-month    . diazepam (VALIUM) 5 MG tablet Take 5 mg by mouth at bedtime as needed for sedation.     . fluticasone (FLONASE) 50 MCG/ACT nasal spray Place 1-2 sprays into the nose at bedtime. 16 g 6  . gabapentin (NEURONTIN) 300 MG capsule Take 600 mg by mouth 2 (two) times daily.     Marland Kitchen glimepiride (AMARYL) 2 MG tablet Take 2 mg by mouth daily before breakfast.    . HYDROcodone-acetaminophen (NORCO) 7.5-325 MG per  tablet Take 1-2 tablets by mouth every 4 (four) hours as needed for moderate pain. 100 tablet 0  . hyoscyamine (LEVSIN, ANASPAZ) 0.125 MG tablet Take 0.125 mg by mouth every 6 (six) hours as needed.    Marland Kitchen ipratropium (ATROVENT HFA) 17 MCG/ACT inhaler Inhale 2 puffs into the lungs 4 (four) times daily. As needed for wheeze, cough, chest tightness 1 Inhaler prn  . ipratropium (ATROVENT) 0.06 % nasal spray Place 2 sprays into the nose 4 (four) times daily. If needed for drainage 15 mL prn  . levalbuterol (XOPENEX HFA) 45 MCG/ACT inhaler Inhale 1-2 puffs into the lungs every 6 (six) hours as needed for wheezing or shortness of breath. 1 Inhaler prn  . loratadine (CLARITIN) 10 MG tablet Take 10 mg by mouth every morning.     Marland Kitchen losartan-hydrochlorothiazide (HYZAAR) 100-25 MG per tablet Take 1 tablet by mouth daily.    . meclizine (ANTIVERT) 25 MG tablet Take 25 mg by mouth 3 (three) times daily as needed for dizziness.    . metFORMIN (GLUCOPHAGE) 1000 MG tablet Take 1,000 mg by mouth 2 (two) times daily.    . mometasone-formoterol (DULERA) 200-5 MCG/ACT AERO Inhale 2 puffs into the lungs 2 (two) times daily. 1 Inhaler prn  . montelukast (SINGULAIR) 10 MG tablet Take 1 tablet (10 mg total) by mouth daily. (  Patient taking differently: Take 10 mg by mouth every morning. ) 90 tablet 3  . NEXIUM 40 MG capsule Take 1 capsule by mouth every morning.     . polyvinyl alcohol (LIQUIFILM TEARS) 1.4 % ophthalmic solution Place 1 drop into both eyes 2 (two) times daily as needed for dry eyes.    Marland Kitchen PRESCRIPTION MEDICATION Inject 1 each into the skin every 14 (fourteen) days. Allergy shot.    . promethazine-codeine (PHENERGAN WITH CODEINE) 6.25-10 MG/5ML syrup Take 5 mLs by mouth every 6 (six) hours as needed for cough. 200 mL 0  . Respiratory Therapy Supplies (FLUTTER) DEVI Blow through 4 times, repeat three times daily 1 each 0  . sertraline (ZOLOFT) 100 MG tablet Take 200 mg by mouth every morning.     Marland Kitchen tiZANidine  (ZANAFLEX) 4 MG tablet Take 1 tablet (4 mg total) by mouth every 6 (six) hours as needed for muscle spasms. 40 tablet 0  . Vitamin D, Ergocalciferol, (DRISDOL) 50000 UNITS CAPS Take 1 capsule by mouth once a week. Monday     No current facility-administered medications on file prior to visit.

## 2014-12-11 NOTE — Telephone Encounter (Signed)
Pt aware that Spiriva and Cough syrup are being refilled to pharmacy - [facility] [place] Nothing further needed.

## 2014-12-11 NOTE — Telephone Encounter (Signed)
Ok to refill both and place on med list. spiriva handihaler, # 30, 1 daily refill prn

## 2015-01-07 ENCOUNTER — Encounter: Payer: Self-pay | Admitting: Internal Medicine

## 2015-01-07 ENCOUNTER — Ambulatory Visit (INDEPENDENT_AMBULATORY_CARE_PROVIDER_SITE_OTHER): Payer: BLUE CROSS/BLUE SHIELD | Admitting: Internal Medicine

## 2015-01-07 VITALS — BP 128/72 | HR 72 | Ht 66.0 in | Wt 255.0 lb

## 2015-01-07 DIAGNOSIS — J383 Other diseases of vocal cords: Secondary | ICD-10-CM

## 2015-01-07 DIAGNOSIS — J479 Bronchiectasis, uncomplicated: Secondary | ICD-10-CM

## 2015-01-07 DIAGNOSIS — G4733 Obstructive sleep apnea (adult) (pediatric): Secondary | ICD-10-CM

## 2015-01-07 DIAGNOSIS — J4532 Mild persistent asthma with status asthmaticus: Secondary | ICD-10-CM | POA: Diagnosis not present

## 2015-01-07 NOTE — Progress Notes (Signed)
Patient ID: Dominique Robbins, female    DOB: 1951-03-02, 64 y.o.   MRN: [id number]  HPI 10/16/10- 64 yo never smoker followed for asthma, allergic rhinitis, OSA, vocal cord paresis/ hoarseness. Last here June 02, 2010. PFT then reviewd FEV1 2.29/76% and IgE 86.3.  Since then no real change "pretty good" bothered by humidity and oak tree pollen. Some silent reflux- on Nexium but no aspirational choke while eating. Had failed Astelin in past but used some antihistamines. Uses Flonase  CPAP compliance all night every night 11 cwp.  Spiriva helps but doesn't last 12 hours.   05/11/11-  64 yo never smoker followed for asthma, allergic rhinitis, OSA, vocal cord paresis/ hoarseness. Has had flu vaccine. No major problems with her breathing since last here. Remains comfortable with CPAP 11 CWP. Nexium controls heartburn symptoms. This season is not associated with significant rhinitis complaints. There has been no change in chronic hoarseness after placement of vocal cord stent.   05/06/12- 64 yo never smoker followed for asthma, allergic rhinitis, OSA, vocal cord paresis/ hoarseness. FOLLOWS FOR: wears CPAP 11/ [facility] every night for about 8 hours and pressure working well for patient;nasal and chest congestion, cough-unable to breathe when coughing that hard She blames weather change or a recent cold. Had flu vaccine. Residual dry cough without much wheezing.  11/10/12- 64 yo never smoker followed for asthma, allergic rhinitis, OSA, vocal cord paresis/ hoarseness. FOLLOWS FOR: wears CPAP 11/ [facility] every night for about 8 hours and pressure doing well; not due for any supplies at this time Voice is doing better. Occasional cough and needs to keep cough syrup. Little wheeze. Less use of her nebulizer machine. We reviewed medications.  05/08/13- 64 yo never smoker followed for asthma, allergic rhinitis, OSA, vocal cord paresis/ hoarseness. FOLLOWS FOR: DME is [facility]-Wears CPAP 11 every night for  at least 8 hours; pressure working well for patient. Breathing okay but needs refill of cough syrup. Wants to restart Spiriva and Singulair. Denies reflux or cough with meals. Bothersome cough and tightness.  12/05/13- 64 yo never smoker followed for asthma, allergic rhinitis,chronic cough,  OSA, vocal cord paresis/ hoarseness. FOLLOW FOR:  Wearing CPAP 6-8 hours per night.  Still having non-productive and request rx for cough syrup Saw no change after stopping Singulair. Continue Spiriva. Chronic cough and hoarseness from vocal cords CPAP is helpful and used all night every night.  06/07/14- 64 yo never smoker followed for asthma/ bronchiectasis, allergic rhinitis,chronic cough,  OSA, vocal cord paresis/ hoarseness, lung nodule FOLLOWS FOR: Wears CPAP  11 every night through [place]. Continues to have cough and wants cough syrup Rx. Less hoarse recently. CT chest 05/14/14  Reviewed with her IMPRESSION: 1. 3 mm right middle lobe nodule as described on the report of the prior CT from September, 2015 [facility]. No pulmonary nodules elsewhere in either lung. Please see below for followup recommendations. 2. Mild bronchiectasis involving the posterior basal segment of the right lower lobe, with several of the bronchi filled with mucus. No evidence of bronchiectasis elsewhere. 3. Mild changes of COPD/emphysema. No acute cardiopulmonary disease. 4. Hepatic steatosis. If the patient is at high risk for bronchogenic carcinoma, follow-up chest CT at 1 year is recommended. If the patient is at low risk, no follow-up is needed. This recommendation follows the consensus statement: Guidelines for Management of Small Pulmonary Nodules Detected on CT Scans: A Statement from the Fleischner Society as published in Radiology 2005; 237:395-400. Electronically Signed  By: Hulan Saas  M.D.  On: 05/14/2014 10:03  01/07/15- 64 yo never smoker followed for asthma/ bronchiectasis, allergic  rhinitis,chronic cough/ bronchectasis/ COPD,  OSA, vocal cord paresis/ hoarseness, lung nodule FOLLOWS FOR: Pt states her breathing has worsened since the hot weather. Pt c/o dry cough. Pt denies CP/tightness. Pt stated she is wearing CPAP 11/[facility]  nightly for at least 8 hours. Pt denies issues with mask, pressure or machine.  She is getting allergy shots now in [place] from [employer]. She refers to dyspnea on exertion, noticed mainly with housework and similar activities around the home, associated particularly with the hot weather. She lives with smoker and admits ongoing secondhand smoke exposure although she tries to allow him to smoke only in the kitchen.  Review of Systems- see HPI Constitutional:   No-   weight loss, night sweats, fevers, chills, fatigue, lassitude. HEENT:   No-  headaches, difficulty swallowing, tooth/dental problems, sore throat,       No-  sneezing, itching, ear ache, nasal congestion, post nasal drip,  CV:  No-   chest pain, orthopnea, PND, swelling in lower extremities, anasarca, dizziness, palpitations Resp: +shortness of breath with exertion or at rest.              No-   productive cough,  + non-productive cough,  No- coughing up of blood.              No-   change in color of mucus.  + wheezing.   Skin: No-   rash or lesions. GI:  No-   heartburn, indigestion, abdominal pain, nausea, vomiting,  GU MS:  Neuro-     nothing unusual Psych:  No- change in mood or affect. No depression or anxiety.  No memory loss.    Objective:   Physical Exam General- Alert, Oriented, Affect-appropriate, Distress- none acute, +obese Skin- rash-none, lesions- none, excoriation- none Lymphadenopathy- none Head- atraumatic            Eyes- Gross vision intact, PERRLA, conjunctivae clear secretions            Ears- Hearing, canals-normal            Nose- Clear, no-Septal dev, mucus, polyps, erosion, perforation.              Throat- Mallampati IV , mucosa clear ,  drainage- none, tonsils- atrophic, + hoarse,                 Neck- flexible , trachea midline, no stridor , thyroid nl, carotid no bruit Chest - symmetrical excursion , unlabored           Heart/CV- RRR , no murmur , no gallop  , no rub, nl s1 s2                           - JVD- none , edema- none, stasis changes- none, varices- none           Lung- clear to P&A, wheeze- none, cough+ dry , dullness-none, rub- none           Chest wall-  Abd- Br/ Gen/ Rectal- Not done, not indicated Extrem- cyanosis- none, clubbing, none, atrophy- none, strength- nl. +Cane Neuro- grossly intact to observation

## 2015-01-07 NOTE — Patient Instructions (Signed)
Order schedule PFT-    Dx bronchiectasis without exacerbation

## 2015-01-08 NOTE — Assessment & Plan Note (Addendum)
We reinforced medical indications for CPAP, comfort measures and goals.

## 2015-01-08 NOTE — Assessment & Plan Note (Signed)
Always hoarse and identified probable recurrent microaspiration. Swallowing instructions reinforced.

## 2015-01-08 NOTE — Assessment & Plan Note (Signed)
Described on imaging and clinically associated with microaspiration secondary to her vocal cord paresis

## 2015-01-08 NOTE — Assessment & Plan Note (Signed)
Ongoing secondhand smoke exposure as a significant factor but she reports feeling well currently as far as wheezing and cough. She always has a modest persistent cough related to bronchiectasis from smoke exposure and probable micro-aspiration.

## 2015-01-28 ENCOUNTER — Other Ambulatory Visit: Payer: Self-pay | Admitting: *Deleted

## 2015-01-28 MED ORDER — TIOTROPIUM BROMIDE MONOHYDRATE 18 MCG IN CAPS: 18.0000 ug | ORAL_CAPSULE | Freq: Every day | RESPIRATORY_TRACT | Status: DC

## 2015-02-28 ENCOUNTER — Telehealth: Payer: Self-pay | Admitting: Internal Medicine

## 2015-02-28 NOTE — Telephone Encounter (Signed)
Per SN in CY absence  Okay to refill phenergan w/ codeine cough syrup  #2100ml x 0 refills Take 5 mls by mouth every six hours as needed for cough  Called and spoke to pt. Informed her that phenergan w/ codeine cough syrup will be called into pharmacy today. Verified pharmacy was [facility] in [place] on [address]  Rx was called into pharmacy @ [phone]  Pt awarerx was called in Nothing further needed

## 2015-02-28 NOTE — Telephone Encounter (Signed)
Called pt. She is requesting refill on phenergan w/ codeine cough syrup. Reports she has a dry cough. Denies any other symptoms. Last refilled by Dr. Maple HudsonYoung 12/11/14 #200 ml x 0 refills. Take 5 mLs by mouth every 6 (six) hours as needed for cough  Please advise Dr. Kriste BasqueNadel in Dr. Roxy CedarYoung's absence. Thanks  No Known Allergies   Current Outpatient Prescriptions on File Prior to Visit  Medication Sig Dispense Refill  . albuterol (PROAIR HFA) 108 (90 BASE) MCG/ACT inhaler Inhale 1-2 puffs into the lungs every 6 (six) hours as needed for wheezing or shortness of breath. 1 Inhaler 3  . albuterol (PROVENTIL) (2.5 MG/3ML) 0.083% nebulizer solution Take 3 mLs (2.5 mg total) by nebulization every 6 (six) hours as needed for wheezing or shortness of breath. 75 mL prn  . azelastine (ASTELIN) 137 MCG/SPRAY nasal spray Place 1 spray into the nose 2 (two) times daily. Use in each nostril as directed    . Beclomethasone Dipropionate (QNASL) 80 MCG/ACT AERS Place 1 spray into the nose daily. (Patient taking differently: Place 1 spray into the nose every morning. ) 1 Inhaler prn  . busPIRone (BUSPAR) 15 MG tablet Take 1 tablet by mouth Twice daily.    . carbamazepine (TEGRETOL) 200 MG tablet Take 200 mg by mouth 2 (two) times daily.    . Ciclesonide (ZETONNA) 37 MCG/ACT AERS 2 sprays daily.    . CRESTOR 20 MG tablet Take 20 mg by mouth every morning.     . cyanocobalamin (,VITAMIN B-12,) 1000 MCG/ML injection Inject 1,000 mcg into the muscle every 30 (thirty) days. Mid-month    . diazepam (VALIUM) 5 MG tablet Take 5 mg by mouth at bedtime as needed for sedation.     . fluticasone (FLONASE) 50 MCG/ACT nasal spray Place 1-2 sprays into the nose at bedtime. 16 g 6  . gabapentin (NEURONTIN) 300 MG capsule Take 600 mg by mouth 2 (two) times daily.     Marland Kitchen. glimepiride (AMARYL) 2 MG tablet Take 2 mg by mouth daily before breakfast.    . hyoscyamine (LEVSIN, ANASPAZ) 0.125 MG tablet Take 0.125 mg by mouth every 6 (six) hours  as needed.    Marland Kitchen. ipratropium (ATROVENT HFA) 17 MCG/ACT inhaler Inhale 2 puffs into the lungs 4 (four) times daily. As needed for wheeze, cough, chest tightness 1 Inhaler prn  . ipratropium (ATROVENT) 0.06 % nasal spray Place 2 sprays into the nose 4 (four) times daily. If needed for drainage 15 mL prn  . levalbuterol (XOPENEX HFA) 45 MCG/ACT inhaler Inhale 1-2 puffs into the lungs every 6 (six) hours as needed for wheezing or shortness of breath. 1 Inhaler prn  . loratadine (CLARITIN) 10 MG tablet Take 10 mg by mouth every morning.     Marland Kitchen. losartan-hydrochlorothiazide (HYZAAR) 100-25 MG per tablet Take 1 tablet by mouth daily.    . meclizine (ANTIVERT) 25 MG tablet Take 25 mg by mouth 3 (three) times daily as needed for dizziness.    . metFORMIN (GLUCOPHAGE) 1000 MG tablet Take 1,000 mg by mouth 2 (two) times daily.    . mometasone-formoterol (DULERA) 200-5 MCG/ACT AERO Inhale 2 puffs into the lungs 2 (two) times daily. 1 Inhaler prn  . montelukast (SINGULAIR) 10 MG tablet Take 1 tablet (10 mg total) by mouth daily. (Patient taking differently: Take 10 mg by mouth every morning. ) 90 tablet 3  . NEXIUM 40 MG capsule Take 1 capsule by mouth every morning.     . polyvinyl alcohol (  LIQUIFILM TEARS) 1.4 % ophthalmic solution Place 1 drop into both eyes 2 (two) times daily as needed for dry eyes.    Marland Kitchen PRESCRIPTION MEDICATION Inject 1 each into the skin every 14 (fourteen) days. Allergy shot.    . promethazine-codeine (PHENERGAN WITH CODEINE) 6.25-10 MG/5ML syrup Take 5 mLs by mouth every 6 (six) hours as needed for cough. 200 mL 0  . Respiratory Therapy Supplies (FLUTTER) DEVI Blow through 4 times, repeat three times daily 1 each 0  . sertraline (ZOLOFT) 100 MG tablet Take 200 mg by mouth every morning.     . tiotropium (SPIRIVA) 18 MCG inhalation capsule Place 1 capsule (18 mcg total) into inhaler and inhale daily. 30 capsule 2  . tiZANidine (ZANAFLEX) 4 MG tablet Take 1 tablet (4 mg total) by mouth every  6 (six) hours as needed for muscle spasms. 40 tablet 0  . Vitamin D, Ergocalciferol, (DRISDOL) 50000 UNITS CAPS Take 1 capsule by mouth once a week. Monday     No current facility-administered medications on file prior to visit.

## 2015-04-04 ENCOUNTER — Telehealth: Payer: Self-pay | Admitting: Internal Medicine

## 2015-04-04 MED ORDER — MOMETASONE FURO-FORMOTEROL FUM 200-5 MCG/ACT IN AERO: 2.0000 | INHALATION_SPRAY | Freq: Two times a day (BID) | RESPIRATORY_TRACT | Status: DC

## 2015-04-04 MED ORDER — ALBUTEROL SULFATE HFA 108 (90 BASE) MCG/ACT IN AERS: 1.0000 | INHALATION_SPRAY | Freq: Four times a day (QID) | RESPIRATORY_TRACT | Status: DC | PRN

## 2015-04-04 MED ORDER — LEVALBUTEROL TARTRATE 45 MCG/ACT IN AERO: 1.0000 | INHALATION_SPRAY | Freq: Four times a day (QID) | RESPIRATORY_TRACT | Status: DC | PRN

## 2015-04-04 NOTE — Telephone Encounter (Signed)
Called and spoke with pt  Pt requested refills on ProAir, xopenox HFA, and Dulera to [facility] [place] Informed pt that refills would be sent  Refills sent electronically to pt's pharmacy  Nothing further is needed

## 2015-04-05 ENCOUNTER — Telehealth: Payer: Self-pay | Admitting: Internal Medicine

## 2015-04-05 MED ORDER — PROMETHAZINE-CODEINE 6.25-10 MG/5ML PO SYRP: 5.0000 mL | ORAL_SOLUTION | Freq: Four times a day (QID) | ORAL | Status: DC | PRN

## 2015-04-05 MED ORDER — PREDNISONE 10 MG PO TABS: ORAL_TABLET | ORAL | Status: DC

## 2015-04-05 NOTE — Telephone Encounter (Signed)
Rx called in for cough syrup. Patient notified. Nothing further needed. Closing encounter

## 2015-04-05 NOTE — Telephone Encounter (Signed)
We don't usually give two different rescue inhalers. These are basically the same medicine. Offer prednisone 10 mg, # 20, 4 X 2 DAYS, 3 X 2 DAYS, 2 X 2 DAYS, 1 X 2 DAYS

## 2015-04-05 NOTE — Telephone Encounter (Signed)
Patient notified of Dr. Roxy CedarYoung's recommendations, Rx for Prednisone sent to pharmacy.    Patient requesting refill on cough syrup.  She says she is out of her Promethazine- CDN syrup.   Ok to refill?

## 2015-04-05 NOTE — Telephone Encounter (Signed)
Ok to refill her cough syrup as before

## 2015-04-05 NOTE — Telephone Encounter (Signed)
Called and spoke with patient, she said that pharmacy only received 1 on the inhalers that she requested to be refilled yesterday.  Patient was wheezing while talking to me on the phone.  I noticed that patient is on Xopenex and ProAir; should patient be on both inhalers for rescue?  Dr. Maple Hudson, please advise.  Current Outpatient Prescriptions on File Prior to Visit  Medication Sig Dispense Refill  . albuterol (PROAIR HFA) 108 (90 BASE) MCG/ACT inhaler Inhale 1-2 puffs into the lungs every 6 (six) hours as needed for wheezing or shortness of breath. 1 Inhaler 3  . albuterol (PROVENTIL) (2.5 MG/3ML) 0.083% nebulizer solution Take 3 mLs (2.5 mg total) by nebulization every 6 (six) hours as needed for wheezing or shortness of breath. 75 mL prn  . azelastine (ASTELIN) 137 MCG/SPRAY nasal spray Place 1 spray into the nose 2 (two) times daily. Use in each nostril as directed    . Beclomethasone Dipropionate (QNASL) 80 MCG/ACT AERS Place 1 spray into the nose daily. (Patient taking differently: Place 1 spray into the nose every morning. ) 1 Inhaler prn  . busPIRone (BUSPAR) 15 MG tablet Take 1 tablet by mouth Twice daily.    . carbamazepine (TEGRETOL) 200 MG tablet Take 200 mg by mouth 2 (two) times daily.    . Ciclesonide (ZETONNA) 37 MCG/ACT AERS 2 sprays daily.    . CRESTOR 20 MG tablet Take 20 mg by mouth every morning.     . cyanocobalamin (,VITAMIN B-12,) 1000 MCG/ML injection Inject 1,000 mcg into the muscle every 30 (thirty) days. Mid-month    . diazepam (VALIUM) 5 MG tablet Take 5 mg by mouth at bedtime as needed for sedation.     . fluticasone (FLONASE) 50 MCG/ACT nasal spray Place 1-2 sprays into the nose at bedtime. 16 g 6  . gabapentin (NEURONTIN) 300 MG capsule Take 600 mg by mouth 2 (two) times daily.     Marland Kitchen glimepiride (AMARYL) 2 MG tablet Take 2 mg by mouth daily before breakfast.    . hyoscyamine (LEVSIN, ANASPAZ) 0.125 MG tablet Take 0.125 mg by mouth every 6 (six) hours as needed.    Marland Kitchen  ipratropium (ATROVENT HFA) 17 MCG/ACT inhaler Inhale 2 puffs into the lungs 4 (four) times daily. As needed for wheeze, cough, chest tightness 1 Inhaler prn  . ipratropium (ATROVENT) 0.06 % nasal spray Place 2 sprays into the nose 4 (four) times daily. If needed for drainage 15 mL prn  . levalbuterol (XOPENEX HFA) 45 MCG/ACT inhaler Inhale 1-2 puffs into the lungs every 6 (six) hours as needed for wheezing or shortness of breath. 1 Inhaler prn  . loratadine (CLARITIN) 10 MG tablet Take 10 mg by mouth every morning.     Marland Kitchen losartan-hydrochlorothiazide (HYZAAR) 100-25 MG per tablet Take 1 tablet by mouth daily.    . meclizine (ANTIVERT) 25 MG tablet Take 25 mg by mouth 3 (three) times daily as needed for dizziness.    . metFORMIN (GLUCOPHAGE) 1000 MG tablet Take 1,000 mg by mouth 2 (two) times daily.    . mometasone-formoterol (DULERA) 200-5 MCG/ACT AERO Inhale 2 puffs into the lungs 2 (two) times daily. 1 Inhaler prn  . montelukast (SINGULAIR) 10 MG tablet Take 1 tablet (10 mg total) by mouth daily. (Patient taking differently: Take 10 mg by mouth every morning. ) 90 tablet 3  . NEXIUM 40 MG capsule Take 1 capsule by mouth every morning.     . polyvinyl alcohol (LIQUIFILM TEARS) 1.4 % ophthalmic  solution Place 1 drop into both eyes 2 (two) times daily as needed for dry eyes.    Marland Kitchen. PRESCRIPTION MEDICATION Inject 1 each into the skin every 14 (fourteen) days. Allergy shot.    . promethazine-codeine (PHENERGAN WITH CODEINE) 6.25-10 MG/5ML syrup Take 5 mLs by mouth every 6 (six) hours as needed for cough. 200 mL 0  . Respiratory Therapy Supplies (FLUTTER) DEVI Blow through 4 times, repeat three times daily 1 each 0  . sertraline (ZOLOFT) 100 MG tablet Take 200 mg by mouth every morning.     . tiotropium (SPIRIVA) 18 MCG inhalation capsule Place 1 capsule (18 mcg total) into inhaler and inhale daily. 30 capsule 2  . tiZANidine (ZANAFLEX) 4 MG tablet Take 1 tablet (4 mg total) by mouth every 6 (six) hours as  needed for muscle spasms. 40 tablet 0  . Vitamin D, Ergocalciferol, (DRISDOL) 50000 UNITS CAPS Take 1 capsule by mouth once a week. Monday     No current facility-administered medications on file prior to visit.   No Known Allergies

## 2015-07-10 ENCOUNTER — Ambulatory Visit (INDEPENDENT_AMBULATORY_CARE_PROVIDER_SITE_OTHER): Payer: BLUE CROSS/BLUE SHIELD | Admitting: Internal Medicine

## 2015-07-10 ENCOUNTER — Encounter: Payer: Self-pay | Admitting: Internal Medicine

## 2015-07-10 VITALS — BP 122/76 | HR 82 | Ht 68.0 in | Wt 274.8 lb

## 2015-07-10 DIAGNOSIS — J479 Bronchiectasis, uncomplicated: Secondary | ICD-10-CM

## 2015-07-10 DIAGNOSIS — J4542 Moderate persistent asthma with status asthmaticus: Secondary | ICD-10-CM | POA: Diagnosis not present

## 2015-07-10 DIAGNOSIS — G4733 Obstructive sleep apnea (adult) (pediatric): Secondary | ICD-10-CM

## 2015-07-10 DIAGNOSIS — J383 Other diseases of vocal cords: Secondary | ICD-10-CM | POA: Diagnosis not present

## 2015-07-10 LAB — PULMONARY FUNCTION TEST
DL/VA % pred: 84 %
DL/VA: 4.43 ml/min/mmHg/L
DLCO COR % PRED: 67 %
DLCO UNC: 19.7 ml/min/mmHg
DLCO cor: 20.15 ml/min/mmHg
DLCO unc % pred: 66 %
FEF 25-75 POST: 2.82 L/s
FEF 25-75 Pre: 1.77 L/sec
FEF2575-%CHANGE-POST: 59 %
FEF2575-%PRED-POST: 121 %
FEF2575-%Pred-Pre: 76 %
FEV1-%CHANGE-POST: 10 %
FEV1-%PRED-POST: 83 %
FEV1-%Pred-Pre: 75 %
FEV1-Post: 2.32 L
FEV1-Pre: 2.11 L
FEV1FVC-%CHANGE-POST: 1 %
FEV1FVC-%PRED-PRE: 100 %
FEV6-%Change-Post: 8 %
FEV6-%PRED-POST: 85 %
FEV6-%Pred-Pre: 78 %
FEV6-Post: 2.97 L
FEV6-Pre: 2.73 L
FEV6FVC-%CHANGE-POST: 0 %
FEV6FVC-%Pred-Post: 103 %
FEV6FVC-%Pred-Pre: 104 %
FVC-%CHANGE-POST: 9 %
FVC-%PRED-PRE: 75 %
FVC-%Pred-Post: 82 %
FVC-PRE: 2.73 L
FVC-Post: 2.98 L
POST FEV1/FVC RATIO: 78 %
PRE FEV6/FVC RATIO: 100 %
Post FEV6/FVC ratio: 100 %
Pre FEV1/FVC ratio: 77 %
RV % pred: 64 %
RV: 1.5 L
TLC % pred: 83 %
TLC: 4.72 L

## 2015-07-10 MED ORDER — PROMETHAZINE-CODEINE 6.25-10 MG/5ML PO SYRP: 5.0000 mL | ORAL_SOLUTION | Freq: Four times a day (QID) | ORAL | Status: DC | PRN

## 2015-07-10 MED ORDER — IPRATROPIUM BROMIDE 0.02 % IN SOLN: 0.5000 mg | Freq: Four times a day (QID) | RESPIRATORY_TRACT | Status: DC | PRN

## 2015-07-10 NOTE — Patient Instructions (Signed)
Script to try atrovent nebulizer solution to see if it helps as is better tolerated  If you like Atrovent, we may be able to get neb solution through [facility] once you are on Medicare  Order- DME [facility]    Please download CPAP for pressure compliance. Add AirView if able    Dx OSA

## 2015-07-10 NOTE — Progress Notes (Signed)
PFT done today. 

## 2015-07-10 NOTE — Assessment & Plan Note (Signed)
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

## 2015-07-10 NOTE — Assessment & Plan Note (Signed)
She describes good compliance and control with improved quality of life. Plan-request download for documentation

## 2015-07-10 NOTE — Assessment & Plan Note (Addendum)
Continue reflux and aspiration precautions We discussed options for improving pulmonary toilet/airway clearance. She has used flutter device with limited benefit. Antibiotics from [place], her allergist in [facility]. Evaluating for possible immunodeficiency because of her chronic cough.

## 2015-07-10 NOTE — Assessment & Plan Note (Signed)
Moderate persistent without exacerbation. Even Xopenex 0.63 neb solution over stimulates. Plan-try Atrovent nebulizer solution. If it is helpful, when she goes on Medicare in a month, we can see about getting it through her DME company.

## 2015-07-10 NOTE — Progress Notes (Addendum)
Patient ID: Dominique Robbins, female    DOB: [birth year], 65 y.o.   MRN: [id number]  HPI 10/16/10- 68 yo never smoker followed for asthma, allergic rhinitis, OSA, vocal cord paresis/ hoarseness. Last here June 02, 2010. PFT then reviewd FEV1 2.29/76% and IgE 86.3.  Since then no real change "pretty good" bothered by humidity and oak tree pollen. Some silent reflux- on Nexium but no aspirational choke while eating. Had failed Astelin in past but used some antihistamines. Uses Flonase  CPAP compliance all night every night 11 cwp.  Spiriva helps but doesn't last 12 hours.   05/11/11-  44 yo never smoker followed for asthma, allergic rhinitis, OSA, vocal cord paresis/ hoarseness. Has had flu vaccine. No major problems with her breathing since last here. Remains comfortable with CPAP 11 CWP. Nexium controls heartburn symptoms. This season is not associated with significant rhinitis complaints. There has been no change in chronic hoarseness after placement of vocal cord stent.   05/06/12- 11 yo never smoker followed for asthma, allergic rhinitis, OSA, vocal cord paresis/ hoarseness. FOLLOWS FOR: wears CPAP 11/ [facility] every night for about 8 hours and pressure working well for patient;nasal and chest congestion, cough-unable to breathe when coughing that hard She blames weather change or a recent cold. Had flu vaccine. Residual dry cough without much wheezing.  11/10/12- 11 yo never smoker followed for asthma, allergic rhinitis, OSA, vocal cord paresis/ hoarseness. FOLLOWS FOR: wears CPAP 11/ [facility] every night for about 8 hours and pressure doing well; not due for any supplies at this time Voice is doing better. Occasional cough and needs to keep cough syrup. Little wheeze. Less use of her nebulizer machine. We reviewed medications.  05/08/13- 1 yo never smoker followed for asthma, allergic rhinitis, OSA, vocal cord paresis/ hoarseness. FOLLOWS FOR: DME is [facility]-Wears CPAP 11 every night for  at least 8 hours; pressure working well for patient. Breathing okay but needs refill of cough syrup. Wants to restart Spiriva and Singulair. Denies reflux or cough with meals. Bothersome cough and tightness.  12/05/13- 23 yo never smoker followed for asthma, allergic rhinitis,chronic cough,  OSA, vocal cord paresis/ hoarseness. FOLLOW FOR:  Wearing CPAP 6-8 hours per night.  Still having non-productive and request rx for cough syrup Saw no change after stopping Singulair. Continue Spiriva. Chronic cough and hoarseness from vocal cords CPAP is helpful and used all night every night.  06/07/14- 16 yo never smoker followed for asthma/ bronchiectasis, allergic rhinitis,chronic cough,  OSA, vocal cord paresis/ hoarseness, lung nodule FOLLOWS FOR: Wears CPAP  11 every night through [place]. Continues to have cough and wants cough syrup Rx. Less hoarse recently. CT chest 05/14/14  Reviewed with her IMPRESSION: 1. 3 mm right middle lobe nodule as described on the report of the prior CT from September, 2015 [facility]. No pulmonary nodules elsewhere in either lung. Please see below for followup recommendations. 2. Mild bronchiectasis involving the posterior basal segment of the right lower lobe, with several of the bronchi filled with mucus. No evidence of bronchiectasis elsewhere. 3. Mild changes of COPD/emphysema. No acute cardiopulmonary disease. 4. Hepatic steatosis. If the patient is at high risk for bronchogenic carcinoma, follow-up chest CT at 1 year is recommended. If the patient is at low risk, no follow-up is needed. This recommendation follows the consensus statement: Guidelines for Management of Small Pulmonary Nodules Detected on CT Scans: A Statement from the Fleischner Society as published in Radiology 2005; 237:395-400. Electronically Signed  By: Hulan Saas  M.D.  On: 05/14/2014 10:03  01/07/15- 65 yo never smoker followed for asthma/ bronchiectasis, allergic  rhinitis,chronic cough/ bronchectasis/ COPD,  OSA, vocal cord paresis/ hoarseness, lung nodule FOLLOWS FOR: Pt states her breathing has worsened since the hot weather. Pt c/o dry cough. Pt denies CP/tightness. Pt stated she is wearing CPAP 11/[facility]  nightly for at least 8 hours. Pt denies issues with mask, pressure or machine.  She is getting allergy shots now in [place] from [employer]. She refers to dyspnea on exertion, noticed mainly with housework and similar activities around the home, associated particularly with the hot weather. She lives with smoker and admits ongoing secondhand smoke exposure although she tries to allow him to smoke only in the kitchen.  07/10/2015-65 year old female never smoker followed for asthma/bronchiectasis/chronic cough, allergic rhinitis, vocal cord paralysis/hoarseness, lung nodule, OSA CPAP 11/[facility] FOLLOWS FOR: Wears CPAP every night for at least 8-9 hours; DME: [facility]-need order for new supplies and DL as patient has not recent DL or in [place]-pt did not bring her card with her today. Review PFT with patient as well. She feels well controlled with CPAP and says life is better using it. Nebulizer medication-albuterol and Xopenex both give bad shakes. Sustained tremor is noticed here about an hour after she was given 4 puffs of albuterol for her PFT. No palpitation. Continued chronic cough usually productive but cough is often ineffectual because of her vocal cord paralysis. PFT: 07/10/2015-mild obstructive airways disease with minimal/insignificant response to bronchodilator, diffusion mildly reduced. FVC 2.98/82%, FEV1 2.32/83%, FEV1/FVC 0.78, TLC 83%, DLCO 66%  Review of Systems- see HPI Constitutional:   No-   weight loss, night sweats, fevers, chills, fatigue, lassitude. HEENT:   No-  headaches, difficulty swallowing, tooth/dental problems, sore throat,       No-  sneezing, itching, ear ache, nasal congestion, post nasal drip,  CV:  No-   chest  pain, orthopnea, PND, swelling in lower extremities, anasarca, dizziness, palpitations Resp: +shortness of breath with exertion or at rest.              + productive cough,  + non-productive cough,  No- coughing up of blood.              No-   change in color of mucus.  + wheezing.   Skin: No-   rash or lesions. GI:  No-   heartburn, indigestion, abdominal pain, nausea, vomiting,  GU MS:  Neuro-     nothing unusual Psych:  No- change in mood or affect. No depression or anxiety.  No memory loss.    Objective:   Physical Exam General- Alert, Oriented, Affect-appropriate, Distress- none acute, +obese Skin- rash-none, lesions- none, excoriation- none Lymphadenopathy- none Head- atraumatic            Eyes- Gross vision intact, PERRLA, conjunctivae clear secretions            Ears- Hearing, canals-normal            Nose- Clear, no-Septal dev, mucus, polyps, erosion, perforation.              Throat- Mallampati IV , mucosa clear , drainage- none, tonsils- atrophic, + hoarse,                 Neck- flexible , trachea midline, no stridor , thyroid nl, carotid no bruit Chest - symmetrical excursion , unlabored           Heart/CV- RRR , no murmur , no gallop  ,  no rub, nl s1 s2                           - JVD- none , edema- none, stasis changes- none, varices- none           Lung- clear to P&A, wheeze- none, cough+ dry today but with basilar rattle , dullness-none, rub- none           Chest wall-  Abd- Br/ Gen/ Rectal- Not done, not indicated Extrem- cyanosis- none, clubbing, none, atrophy- none, strength- nl. +Cane Neuro- grossly intact to observation

## 2015-08-06 ENCOUNTER — Telehealth: Payer: Self-pay | Admitting: Internal Medicine

## 2015-08-06 MED ORDER — PROMETHAZINE-CODEINE 6.25-10 MG/5ML PO SYRP: 5.0000 mL | ORAL_SOLUTION | Freq: Four times a day (QID) | ORAL | Status: DC | PRN

## 2015-08-06 NOTE — Telephone Encounter (Signed)
Per CY - ok for refill.   Called and spoke to pt. Informed her of the refill per CY. Rx called into preferred pharmacy. Pt verbalized understanding and denied any further questions or concerns at this time.

## 2015-08-06 NOTE — Telephone Encounter (Signed)
Spoke with pt, requesting refill on promethazine-codeine cough syrup. Pt c/o worsening nonprod cough, hoarseness.  Denies chest tightness, pnd, mucus production, fever.  Pt uses [facility] on [address] in [place].   Last refill: #24100mL with 0 refills, take 5 mL q6h prn cough.   CY please advise on refill.  Thanks!   No Known Allergies Current Outpatient Prescriptions on File Prior to Visit  Medication Sig Dispense Refill  . albuterol (PROAIR HFA) 108 (90 BASE) MCG/ACT inhaler Inhale 1-2 puffs into the lungs every 6 (six) hours as needed for wheezing or shortness of breath. 1 Inhaler 3  . albuterol (PROVENTIL) (2.5 MG/3ML) 0.083% nebulizer solution Take 3 mLs (2.5 mg total) by nebulization every 6 (six) hours as needed for wheezing or shortness of breath. 75 mL prn  . azelastine (ASTELIN) 137 MCG/SPRAY nasal spray Place 1 spray into the nose 2 (two) times daily. Use in each nostril as directed    . Beclomethasone Dipropionate (QNASL) 80 MCG/ACT AERS Place 1 spray into the nose daily. (Patient taking differently: Place 1 spray into the nose every morning. ) 1 Inhaler prn  . busPIRone (BUSPAR) 15 MG tablet Take 1 tablet by mouth Twice daily.    . carbamazepine (TEGRETOL) 200 MG tablet Take 200 mg by mouth 2 (two) times daily.    . Ciclesonide (ZETONNA) 37 MCG/ACT AERS 2 sprays daily.    . CRESTOR 20 MG tablet Take 20 mg by mouth every morning.     . cyanocobalamin (,VITAMIN B-12,) 1000 MCG/ML injection Inject 1,000 mcg into the muscle every 30 (thirty) days. Mid-month    . diazepam (VALIUM) 5 MG tablet Take 5 mg by mouth at bedtime as needed for sedation.     . fluticasone (FLONASE) 50 MCG/ACT nasal spray Place 1-2 sprays into the nose at bedtime. 16 g 6  . gabapentin (NEURONTIN) 300 MG capsule Take 600 mg by mouth 2 (two) times daily.     Marland Kitchen. glimepiride (AMARYL) 2 MG tablet Take 2 mg by mouth daily before breakfast.    . hyoscyamine (LEVSIN, ANASPAZ) 0.125 MG tablet Take 0.125 mg by mouth every  6 (six) hours as needed.    Marland Kitchen. ipratropium (ATROVENT HFA) 17 MCG/ACT inhaler Inhale 2 puffs into the lungs 4 (four) times daily. As needed for wheeze, cough, chest tightness 1 Inhaler prn  . ipratropium (ATROVENT) 0.02 % nebulizer solution Take 2.5 mLs (0.5 mg total) by nebulization every 6 (six) hours as needed for wheezing or shortness of breath. 75 mL 12  . ipratropium (ATROVENT) 0.06 % nasal spray Place 2 sprays into the nose 4 (four) times daily. If needed for drainage 15 mL prn  . levalbuterol (XOPENEX HFA) 45 MCG/ACT inhaler Inhale 1-2 puffs into the lungs every 6 (six) hours as needed for wheezing or shortness of breath. 1 Inhaler prn  . loratadine (CLARITIN) 10 MG tablet Take 10 mg by mouth every morning.     Marland Kitchen. losartan-hydrochlorothiazide (HYZAAR) 100-25 MG per tablet Take 1 tablet by mouth daily.    . meclizine (ANTIVERT) 25 MG tablet Take 25 mg by mouth 3 (three) times daily as needed for dizziness.    . metFORMIN (GLUCOPHAGE) 1000 MG tablet Take 1,000 mg by mouth 2 (two) times daily.    . mometasone-formoterol (DULERA) 200-5 MCG/ACT AERO Inhale 2 puffs into the lungs 2 (two) times daily. 1 Inhaler prn  . montelukast (SINGULAIR) 10 MG tablet Take 1 tablet (10 mg total) by mouth daily. (Patient taking differently: Take  10 mg by mouth every morning. ) 90 tablet 3  . NEXIUM 40 MG capsule Take 1 capsule by mouth every morning.     . polyvinyl alcohol (LIQUIFILM TEARS) 1.4 % ophthalmic solution Place 1 drop into both eyes 2 (two) times daily as needed for dry eyes.    Marland Kitchen PRESCRIPTION MEDICATION Inject 1 each into the skin every 14 (fourteen) days. Allergy shot.    . promethazine-codeine (PHENERGAN WITH CODEINE) 6.25-10 MG/5ML syrup Take 5 mLs by mouth every 6 (six) hours as needed for cough. 200 mL 0  . Respiratory Therapy Supplies (FLUTTER) DEVI Blow through 4 times, repeat three times daily 1 each 0  . sertraline (ZOLOFT) 100 MG tablet Take 200 mg by mouth every morning.     . tiotropium  (SPIRIVA) 18 MCG inhalation capsule Place 1 capsule (18 mcg total) into inhaler and inhale daily. 30 capsule 2  . tiZANidine (ZANAFLEX) 4 MG tablet Take 1 tablet (4 mg total) by mouth every 6 (six) hours as needed for muscle spasms. 40 tablet 0  . Vitamin D, Ergocalciferol, (DRISDOL) 50000 UNITS CAPS Take 1 capsule by mouth once a week. Monday     No current facility-administered medications on file prior to visit.

## 2015-08-06 NOTE — Telephone Encounter (Signed)
Ok- given to triage

## 2015-08-08 ENCOUNTER — Telehealth: Payer: Self-pay | Admitting: Internal Medicine

## 2015-08-08 MED ORDER — GUAIFENESIN-CODEINE 100-10 MG/5ML PO SYRP: 5.0000 mL | ORAL_SOLUTION | Freq: Four times a day (QID) | ORAL | Status: DC | PRN

## 2015-08-08 NOTE — Telephone Encounter (Signed)
Spoke with pt, states that [facility] told her that promethadine-codeine cough syrup is "no longer made", and needs an alternative sent in. [facility], states that promethadine-codeine cough syrup is on backorder and needs an alternative med called in. Spoke with pt, aware of the above. Prometh-codeine called in to pharmacy on 3/21- #298ml with 0 refills.   CY please advise on alternative med.  Thanks!   No Known Allergies Current Outpatient Prescriptions on File Prior to Visit  Medication Sig Dispense Refill  . albuterol (PROAIR HFA) 108 (90 BASE) MCG/ACT inhaler Inhale 1-2 puffs into the lungs every 6 (six) hours as needed for wheezing or shortness of breath. 1 Inhaler 3  . albuterol (PROVENTIL) (2.5 MG/3ML) 0.083% nebulizer solution Take 3 mLs (2.5 mg total) by nebulization every 6 (six) hours as needed for wheezing or shortness of breath. 75 mL prn  . azelastine (ASTELIN) 137 MCG/SPRAY nasal spray Place 1 spray into the nose 2 (two) times daily. Use in each nostril as directed    . Beclomethasone Dipropionate (QNASL) 80 MCG/ACT AERS Place 1 spray into the nose daily. (Patient taking differently: Place 1 spray into the nose every morning. ) 1 Inhaler prn  . busPIRone (BUSPAR) 15 MG tablet Take 1 tablet by mouth Twice daily.    . carbamazepine (TEGRETOL) 200 MG tablet Take 200 mg by mouth 2 (two) times daily.    . Ciclesonide (ZETONNA) 37 MCG/ACT AERS 2 sprays daily.    . CRESTOR 20 MG tablet Take 20 mg by mouth every morning.     . cyanocobalamin (,VITAMIN B-12,) 1000 MCG/ML injection Inject 1,000 mcg into the muscle every 30 (thirty) days. Mid-month    . diazepam (VALIUM) 5 MG tablet Take 5 mg by mouth at bedtime as needed for sedation.     . fluticasone (FLONASE) 50 MCG/ACT nasal spray Place 1-2 sprays into the nose at bedtime. 16 g 6  . gabapentin (NEURONTIN) 300 MG capsule Take 600 mg by mouth 2 (two) times daily.     Marland Kitchen glimepiride (AMARYL) 2 MG tablet Take 2 mg by mouth  daily before breakfast.    . hyoscyamine (LEVSIN, ANASPAZ) 0.125 MG tablet Take 0.125 mg by mouth every 6 (six) hours as needed.    Marland Kitchen ipratropium (ATROVENT HFA) 17 MCG/ACT inhaler Inhale 2 puffs into the lungs 4 (four) times daily. As needed for wheeze, cough, chest tightness 1 Inhaler prn  . ipratropium (ATROVENT) 0.02 % nebulizer solution Take 2.5 mLs (0.5 mg total) by nebulization every 6 (six) hours as needed for wheezing or shortness of breath. 75 mL 12  . ipratropium (ATROVENT) 0.06 % nasal spray Place 2 sprays into the nose 4 (four) times daily. If needed for drainage 15 mL prn  . levalbuterol (XOPENEX HFA) 45 MCG/ACT inhaler Inhale 1-2 puffs into the lungs every 6 (six) hours as needed for wheezing or shortness of breath. 1 Inhaler prn  . loratadine (CLARITIN) 10 MG tablet Take 10 mg by mouth every morning.     Marland Kitchen losartan-hydrochlorothiazide (HYZAAR) 100-25 MG per tablet Take 1 tablet by mouth daily.    . meclizine (ANTIVERT) 25 MG tablet Take 25 mg by mouth 3 (three) times daily as needed for dizziness.    . metFORMIN (GLUCOPHAGE) 1000 MG tablet Take 1,000 mg by mouth 2 (two) times daily.    . mometasone-formoterol (DULERA) 200-5 MCG/ACT AERO Inhale 2 puffs into the lungs 2 (two) times daily. 1 Inhaler prn  . montelukast (SINGULAIR) 10 MG  tablet Take 1 tablet (10 mg total) by mouth daily. (Patient taking differently: Take 10 mg by mouth every morning. ) 90 tablet 3  . NEXIUM 40 MG capsule Take 1 capsule by mouth every morning.     . polyvinyl alcohol (LIQUIFILM TEARS) 1.4 % ophthalmic solution Place 1 drop into both eyes 2 (two) times daily as needed for dry eyes.    Marland Kitchen. PRESCRIPTION MEDICATION Inject 1 each into the skin every 14 (fourteen) days. Allergy shot.    . promethazine-codeine (PHENERGAN WITH CODEINE) 6.25-10 MG/5ML syrup Take 5 mLs by mouth every 6 (six) hours as needed for cough. 200 mL 0  . Respiratory Therapy Supplies (FLUTTER) DEVI Blow through 4 times, repeat three times daily  1 each 0  . sertraline (ZOLOFT) 100 MG tablet Take 200 mg by mouth every morning.     . tiotropium (SPIRIVA) 18 MCG inhalation capsule Place 1 capsule (18 mcg total) into inhaler and inhale daily. 30 capsule 2  . tiZANidine (ZANAFLEX) 4 MG tablet Take 1 tablet (4 mg total) by mouth every 6 (six) hours as needed for muscle spasms. 40 tablet 0  . Vitamin D, Ergocalciferol, (DRISDOL) 50000 UNITS CAPS Take 1 capsule by mouth once a week. Monday     No current facility-administered medications on file prior to visit.

## 2015-08-08 NOTE — Telephone Encounter (Signed)
Replace with Cheratussin  200 ml,   5 ml every 6 hours as needed for cough. Can supplement with Delsym in between as needed

## 2015-08-08 NOTE — Telephone Encounter (Signed)
rx called in to [facility] pharmacy. Pt aware of recs.  Nothing further needed.

## 2015-08-22 ENCOUNTER — Other Ambulatory Visit (INDEPENDENT_AMBULATORY_CARE_PROVIDER_SITE_OTHER): Payer: Medicare Other

## 2015-08-22 ENCOUNTER — Ambulatory Visit (INDEPENDENT_AMBULATORY_CARE_PROVIDER_SITE_OTHER)
Admission: RE | Admit: 2015-08-22 | Discharge: 2015-08-22 | Disposition: A | Discharge status: Home / Self Care | Payer: Medicare Other | Source: Ambulatory Visit | Attending: Internal Medicine | Admitting: Internal Medicine

## 2015-08-22 ENCOUNTER — Encounter: Payer: Self-pay | Admitting: Internal Medicine

## 2015-08-22 ENCOUNTER — Ambulatory Visit (INDEPENDENT_AMBULATORY_CARE_PROVIDER_SITE_OTHER): Payer: Medicare Other | Admitting: Internal Medicine

## 2015-08-22 ENCOUNTER — Telehealth: Payer: Self-pay | Admitting: Internal Medicine

## 2015-08-22 VITALS — BP 124/62 | HR 78 | Ht 68.0 in | Wt 273.0 lb

## 2015-08-22 DIAGNOSIS — J4540 Moderate persistent asthma, uncomplicated: Secondary | ICD-10-CM

## 2015-08-22 DIAGNOSIS — J4541 Moderate persistent asthma with (acute) exacerbation: Secondary | ICD-10-CM

## 2015-08-22 DIAGNOSIS — R0600 Dyspnea, unspecified: Secondary | ICD-10-CM

## 2015-08-22 DIAGNOSIS — G4733 Obstructive sleep apnea (adult) (pediatric): Secondary | ICD-10-CM

## 2015-08-22 DIAGNOSIS — J479 Bronchiectasis, uncomplicated: Secondary | ICD-10-CM

## 2015-08-22 LAB — CBC WITH DIFFERENTIAL/PLATELET
Basophils Absolute: 0 10*3/uL (ref 0.0–0.1)
Basophils Relative: 0.4 % (ref 0.0–3.0)
EOS ABS: 0.1 10*3/uL (ref 0.0–0.7)
EOS PCT: 1.1 % (ref 0.0–5.0)
HCT: 33 % — ABNORMAL LOW (ref 36.0–46.0)
HEMOGLOBIN: 10.6 g/dL — AB (ref 12.0–15.0)
LYMPHS PCT: 27.7 % (ref 12.0–46.0)
Lymphs Abs: 2.9 10*3/uL (ref 0.7–4.0)
MCHC: 32.2 g/dL (ref 30.0–36.0)
MCV: 75.2 fl — ABNORMAL LOW (ref 78.0–100.0)
MONO ABS: 0.6 10*3/uL (ref 0.1–1.0)
Monocytes Relative: 5.3 % (ref 3.0–12.0)
Neutro Abs: 6.8 10*3/uL (ref 1.4–7.7)
Neutrophils Relative %: 65.5 % (ref 43.0–77.0)
Platelets: 256 10*3/uL (ref 150.0–400.0)
RBC: 4.39 Mil/uL (ref 3.87–5.11)
RDW: 16.6 % — ABNORMAL HIGH (ref 11.5–15.5)
WBC: 10.4 10*3/uL (ref 4.0–10.5)

## 2015-08-22 MED ORDER — GUAIFENESIN-CODEINE 100-10 MG/5ML PO SYRP: 5.0000 mL | ORAL_SOLUTION | Freq: Four times a day (QID) | ORAL | Status: DC | PRN

## 2015-08-22 MED ORDER — LEVALBUTEROL HCL 0.63 MG/3ML IN NEBU
0.6300 mg | INHALATION_SOLUTION | Freq: Once | RESPIRATORY_TRACT | Status: AC
  Administered 2015-08-22: 0.63 mg via RESPIRATORY_TRACT

## 2015-08-22 MED ORDER — METHYLPREDNISOLONE ACETATE 80 MG/ML IJ SUSP
80.0000 mg | Freq: Once | INTRAMUSCULAR | Status: AC
Start: 2015-08-22 — End: 2015-08-22
  Administered 2015-08-22: 80 mg via INTRAMUSCULAR

## 2015-08-22 NOTE — Progress Notes (Signed)
Patient ID: Dominique Robbins, female    DOB: 02/11/1951, 65 y.o.   MRN: [id number]  HPI 10/16/10- 65 yo never smoker followed for asthma, allergic rhinitis, OSA, vocal cord paresis/ hoarseness. Last here June 02, 2010. PFT then reviewd FEV1 2.29/76% and IgE 86.3.  Since then no real change "pretty good" bothered by humidity and oak tree pollen. Some silent reflux- on Nexium but no aspirational choke while eating. Had failed Astelin in past but used some antihistamines. Uses Flonase  CPAP compliance all night every night 11 cwp.  Spiriva helps but doesn't last 12 hours.   05/11/11-  65 yo never smoker followed for asthma, allergic rhinitis, OSA, vocal cord paresis/ hoarseness. Has had flu vaccine. No major problems with her breathing since last here. Remains comfortable with CPAP 11 CWP. Nexium controls heartburn symptoms. This season is not associated with significant rhinitis complaints. There has been no change in chronic hoarseness after placement of vocal cord stent.   05/06/12- 65 yo never smoker followed for asthma, allergic rhinitis, OSA, vocal cord paresis/ hoarseness. FOLLOWS FOR: wears CPAP 11/ [facility] every night for about 8 hours and pressure working well for patient;nasal and chest congestion, cough-unable to breathe when coughing that hard She blames weather change or a recent cold. Had flu vaccine. Residual dry cough without much wheezing.  11/10/12- 65 yo never smoker followed for asthma, allergic rhinitis, OSA, vocal cord paresis/ hoarseness. FOLLOWS FOR: wears CPAP 11/ [facility] every night for about 8 hours and pressure doing well; not due for any supplies at this time Voice is doing better. Occasional cough and needs to keep cough syrup. Little wheeze. Less use of her nebulizer machine. We reviewed medications.  05/08/13- 65 yo never smoker followed for asthma, allergic rhinitis, OSA, vocal cord paresis/ hoarseness. FOLLOWS FOR: DME is [facility]-Wears CPAP 11 every night for  at least 8 hours; pressure working well for patient. Breathing okay but needs refill of cough syrup. Wants to restart Spiriva and Singulair. Denies reflux or cough with meals. Bothersome cough and tightness.  12/05/13- 65 yo never smoker followed for asthma, allergic rhinitis,chronic cough,  OSA, vocal cord paresis/ hoarseness. FOLLOW FOR:  Wearing CPAP 6-8 hours per night.  Still having non-productive and request rx for cough syrup Saw no change after stopping Singulair. Continue Spiriva. Chronic cough and hoarseness from vocal cords CPAP is helpful and used all night every night.  06/07/14- 65 yo never smoker followed for asthma/ bronchiectasis, allergic rhinitis,chronic cough,  OSA, vocal cord paresis/ hoarseness, lung nodule FOLLOWS FOR: Wears CPAP  11 every night through [facility]. Continues to have cough and wants cough syrup Rx. Less hoarse recently. CT chest 05/14/14  Reviewed with her IMPRESSION: 1. 3 mm right middle lobe nodule as described on the report of the prior CT [facility]. No pulmonary nodules elsewhere in either lung. Please see below for followup recommendations. 2. Mild bronchiectasis involving the posterior basal segment of the right lower lobe, with several of the bronchi filled with mucus. No evidence of bronchiectasis elsewhere. 3. Mild changes of COPD/emphysema. No acute cardiopulmonary disease. 4. Hepatic steatosis. If the patient is at high risk for bronchogenic carcinoma, follow-up chest CT at 1 year is recommended. If the patient is at low risk, no follow-up is needed. This recommendation follows the consensus statement: Guidelines for Management of Small Pulmonary Nodules Detected on CT Scans: A Statement from the Fleischner Society as published in Radiology 2005; 237:395-400. Electronically Signed  By: Hulan Saashomas Lawrence  M.D.  On: 05/14/2014 10:03  01/07/15- 65 yo never smoker followed for asthma/ bronchiectasis, allergic  rhinitis,chronic cough/ bronchectasis/ COPD,  OSA, vocal cord paresis/ hoarseness, lung nodule FOLLOWS FOR: Pt states her breathing has worsened since the hot weather. Pt c/o dry cough. Pt denies CP/tightness. Pt stated she is wearing CPAP 11/[facility]  nightly for at least 8 hours. Pt denies issues with mask, pressure or machine.  She is getting allergy shots now in [place] from [employer]. She refers to dyspnea on exertion, noticed mainly with housework and similar activities around the home, associated particularly with the hot weather. She lives with smoker and admits ongoing secondhand smoke exposure although she tries to allow him to smoke only in the kitchen.  07/10/2015-65 year old female never smoker followed for asthma/bronchiectasis/chronic cough, allergic rhinitis, vocal cord paralysis/hoarseness, lung nodule, OSA CPAP 11/[facility] FOLLOWS FOR: Wears CPAP every night for at least 8-9 hours; DME: [facility]-need order for new supplies and DL as patient has not recent DL or in [facility]-pt did not bring her card with her today. Review PFT with patient as well. She feels well controlled with CPAP and says life is better using it. Nebulizer medication-albuterol and Xopenex both give bad shakes. Sustained tremor is noticed here about an hour after she was given 4 puffs of albuterol for her PFT. No palpitation. PFT: 07/10/2015-mild obstructive airways disease with minimal/insignificant response to bronchodilator, diffusion mildly reduced. FVC 2.98/82%, FEV1 2.32/83%, FEV1/FVC 0.78, TLC 83%, DLCO 66%  08/22/2015-65 year old female never smoker followed for asthma/bronchiectasis/chronic cough, allergic rhinitis, vocal cord paralysis/hoarseness, lung nodule, OSA CPAP 11/[facility]  For 2 weeks complains of chest tightness, increased dry cough without fever, more short of breath. Doesn't feel "sick". Denies chest pain, palpitations, leg pain or swelling. Prednisone taper did not help. Can't sleep without  CPAP. It is comfortable and compliance is good.  Review of Systems- see HPI Constitutional:   No-   weight loss, night sweats, fevers, chills, fatigue, lassitude. HEENT:   No-  headaches, difficulty swallowing, tooth/dental problems, sore throat,       No-  sneezing, itching, ear ache, nasal congestion, post nasal drip,  CV:  No-   chest pain, orthopnea, PND, swelling in lower extremities, anasarca, dizziness, palpitations Resp: +shortness of breath with exertion or at rest.              No-   productive cough,  + non-productive cough,  No- coughing up of blood.              No-   change in color of mucus.  + wheezing.   Skin: No-   rash or lesions. GI:  No-   heartburn, indigestion, abdominal pain, nausea, vomiting,  GU MS:  Neuro-     nothing unusual Psych:  No- change in mood or affect. No depression or anxiety.  No memory loss.    Objective:   Physical Exam General- Alert, Oriented, Affect-appropriate, Distress- none acute, +obese Skin- rash-none, lesions- none, excoriation- none Lymphadenopathy- none Head- atraumatic            Eyes- Gross vision intact, PERRLA, conjunctivae clear secretions            Ears- Hearing, canals-normal            Nose- Clear, no-Septal dev, mucus, polyps, erosion, perforation.              Throat- Mallampati IV , mucosa clear , drainage- none, tonsils- atrophic, + hoarse,  + Throat clearing  Neck- flexible , trachea midline, no stridor , thyroid nl, carotid no bruit Chest - symmetrical excursion , unlabored           Heart/CV- RRR , no murmur , no gallop  , no rub, nl s1 s2                           - JVD- none , edema- none, stasis changes- none, varices- none           Lung- clear to P&A, wheeze- none, cough+ dry , dullness-none, rub- none           Chest wall-  Abd- Br/ Gen/ Rectal- Not done, not indicated Extrem- cyanosis- none, clubbing, none, atrophy- none, strength- nl. +Cane Neuro- grossly intact to observation

## 2015-08-22 NOTE — Telephone Encounter (Signed)
Pt c/o increased chest tightness, hoarseness and congestion. Denies mucus production at this time. Feels related to allergies/flare. Using all maintenance meds as directed- no additional OTC treatment. Requests OV. Scheduled with CY at 2:30 today. Nothing further needed.

## 2015-08-22 NOTE — Patient Instructions (Signed)
Order- [facility]- continuation order CPAP 11, mask of choice, humidifier, supplies, [employer]          Dx OSA  Order- CXR- dx asthma moderate persistent  Order- lab- CBC w diff, D-dimer, Allergy profile      Dx dyspnea  Neb Xop 0.63  Depo 80

## 2015-08-23 LAB — RESPIRATORY ALLERGY PROFILE REGION II ~~LOC~~
ALLERGEN, D PTERNOYSSINUS, D1: 3.68 kU/L — AB
ALLERGEN, MULBERRY, T70: 0.29 kU/L — AB
Allergen, Cedar tree, t12: 0.21 kU/L — ABNORMAL HIGH
Allergen, Comm Silver Birch, t9: 0.15 kU/L — ABNORMAL HIGH
Allergen, Cottonwood, t14: 0.37 kU/L — ABNORMAL HIGH
Allergen, Mouse Urine Protein, e78: <0.1 kU/L
Allergen, Oak,t7: 0.48 kU/L — ABNORMAL HIGH
Aspergillus fumigatus, m3: <0.1 kU/L
BOX ELDER: 0.47 kU/L — AB
Bermuda Grass: 0.62 kU/L — ABNORMAL HIGH
COCKROACH: 0.43 kU/L — AB
COMMON RAGWEED: 0.51 kU/L — AB
Cat Dander: <0.1 kU/L
D. FARINAE: 4.07 kU/L — AB
Dog Dander: <0.1 kU/L
Elm IgE: 0.45 kU/L — ABNORMAL HIGH
IgE (Immunoglobulin E), Serum: 59 kU/L (ref <115)
JOHNSON GRASS: 0.65 kU/L — AB
Pecan/Hickory Tree IgE: 0.19 kU/L — ABNORMAL HIGH
Penicillium Notatum: <0.1 kU/L
ROUGH PIGWEED IGE: 0.42 kU/L — AB
SHEEP SORREL IGE: 0.53 kU/L — AB
Timothy Grass: 0.61 kU/L — ABNORMAL HIGH

## 2015-08-23 LAB — D-DIMER, QUANTITATIVE: D-Dimer, Quant: 0.75 ug/mL-FEU — ABNORMAL HIGH (ref 0.00–0.48)

## 2015-08-27 ENCOUNTER — Other Ambulatory Visit (INDEPENDENT_AMBULATORY_CARE_PROVIDER_SITE_OTHER): Payer: Medicare Other

## 2015-08-27 ENCOUNTER — Ambulatory Visit (INDEPENDENT_AMBULATORY_CARE_PROVIDER_SITE_OTHER)
Admission: RE | Admit: 2015-08-27 | Discharge: 2015-08-27 | Disposition: A | Discharge status: Home / Self Care | Payer: Medicare Other | Source: Ambulatory Visit | Attending: Internal Medicine | Admitting: Internal Medicine

## 2015-08-27 ENCOUNTER — Telehealth: Payer: Self-pay | Admitting: Internal Medicine

## 2015-08-27 ENCOUNTER — Other Ambulatory Visit: Payer: Self-pay | Admitting: Internal Medicine

## 2015-08-27 DIAGNOSIS — R7989 Other specified abnormal findings of blood chemistry: Secondary | ICD-10-CM

## 2015-08-27 DIAGNOSIS — R791 Abnormal coagulation profile: Secondary | ICD-10-CM | POA: Diagnosis not present

## 2015-08-27 LAB — BASIC METABOLIC PANEL
BUN: 20 mg/dL (ref 6–23)
CHLORIDE: 103 meq/L (ref 96–112)
CO2: 30 mEq/L (ref 19–32)
Calcium: 9.5 mg/dL (ref 8.4–10.5)
Creatinine, Ser: 0.85 mg/dL (ref 0.40–1.20)
GFR: 71.08 mL/min (ref >60.00)
Glucose, Bld: 128 mg/dL — ABNORMAL HIGH (ref 70–99)
POTASSIUM: 4 meq/L (ref 3.5–5.1)
Sodium: 142 mEq/L (ref 135–145)

## 2015-08-27 MED ORDER — IOPAMIDOL (ISOVUE-370) INJECTION 76%
80.0000 mL | Freq: Once | INTRAVENOUS | Status: AC | PRN
  Administered 2015-08-27: 80 mL via INTRAVENOUS

## 2015-08-27 NOTE — Assessment & Plan Note (Signed)
Increased dyspnea and cough may just be pollen related asthma/bronchitis although she did not respond to a prednisone taper. Plan-CBC with differential, allergy profile, d-dimer because of her body build and dyspnea complaint.      Nebulizer treatments Xopenex, Depo-Medrol

## 2015-08-27 NOTE — Assessment & Plan Note (Signed)
There is no apparent clinical change to explain increased dyspnea and cough. Lack of response to prednisone suggest this may not be an inflammatory process at this time.

## 2015-08-27 NOTE — Progress Notes (Signed)
Quick Note:  Called and spoke with pt. Reviewed results and recs. Pt voiced understanding and had no further questions. ______ 

## 2015-08-27 NOTE — Assessment & Plan Note (Signed)
She reports her good compliance and control she continues CPAP. I will send an update note to [facility].

## 2015-08-27 NOTE — Telephone Encounter (Signed)
Notes Recorded by Waymon Budgelinton D Young, MD on 08/23/2015 at 1:46 PM CXR- stable with clear lungs  Notes Recorded by Waymon Budgelinton D Young, MD on 08/23/2015 at 1:46 PM CXR- stable with clear lungs  Called and spoke with the pt. Reviewed results and recs. Pt voiced understanding and had no further questions. Nothing further needed.

## 2015-08-28 NOTE — Addendum Note (Signed)
Addended by: Ronny BaconWELCHEL, Keeana Pieratt C on: 08/28/2015 03:53 PM   Modules accepted: Orders

## 2015-09-02 ENCOUNTER — Telehealth: Payer: Self-pay | Admitting: Internal Medicine

## 2015-09-02 NOTE — Telephone Encounter (Signed)
Spoke with pt and she did not remember being given her CT results. Reviewed results with pt again. No further questions or concerns.

## 2015-09-02 NOTE — Telephone Encounter (Signed)
Patient called - returning our ph call. - PRM

## 2015-09-02 NOTE — Telephone Encounter (Signed)
lmtcb for pt.  

## 2015-09-03 ENCOUNTER — Telehealth: Payer: Self-pay | Admitting: Internal Medicine

## 2015-09-03 NOTE — Telephone Encounter (Signed)
Dominique Robbins calling from [facility] to get AHI information from Sleep Study.  AHI information on this sleep study is listed as RDI instead of AHI. Dominique Robbins states she would use the RDI number for patient's order. Nothing further needed.

## 2015-09-04 ENCOUNTER — Telehealth: Payer: Self-pay | Admitting: Internal Medicine

## 2015-09-04 DIAGNOSIS — G4733 Obstructive sleep apnea (adult) (pediatric): Secondary | ICD-10-CM

## 2015-09-04 NOTE — Telephone Encounter (Signed)
CY - please advise. Thanks! 

## 2015-09-04 NOTE — Telephone Encounter (Signed)
Ok to schedule HST if insurance will cover. Otherwise split protocol NPSG     Dx OSA

## 2015-09-04 NOTE — Telephone Encounter (Signed)
Patient's insurance does not require precert/preauth.  I have printed her Order and placed her in the HST folder in Libby's office and we schedule by date Ordered (usually within 10 days or less).  One of us will be calling her to get an appt for her to [facility].  If Dr. Maple HudsonYoung wants this done ASAP, please advise.

## 2015-09-04 NOTE — Telephone Encounter (Signed)
Called and spoke with Larita FifeLynn at [facility], she said that since patient's last sleep study does not have an AHI, only RDI, patient will need to have a new sleep study done in order to qualify for a new CPAP machine.    Dr. Maple HudsonYoung, please advise.

## 2015-09-04 NOTE — Telephone Encounter (Signed)
[facility]  [facility] let us know if this is not covered and we can switch this to the split night, thanks

## 2015-09-04 NOTE — Telephone Encounter (Signed)
Ok to schedule HST when able for dx OSA. Then please notify Dominique Robbins of test date so we can get her back for follow-up

## 2015-09-05 NOTE — Telephone Encounter (Signed)
Pt returning call and can be reached @ [phone].Caren GriffinsStanley A Dalton'

## 2015-09-05 NOTE — Telephone Encounter (Signed)
Spoke with pt and advised of Dr Roxy CedarYoung's recommendations to do a HST and that [facility] come [facility].  Pt verbalized understanding.  Nothing further is needed.

## 2015-09-12 ENCOUNTER — Other Ambulatory Visit: Payer: Self-pay | Admitting: Internal Medicine

## 2015-09-12 NOTE — Telephone Encounter (Signed)
Spoke with the pt  She is requesting refill on cheratussin Her cough is unchanged since her last visit here on 08/22/15  She denies any new co's today  Next appt 01/07/16 Last given rx for cheratussin #200 ml 08/22/15   Current Outpatient Prescriptions on File Prior to Visit  Medication Sig Dispense Refill  . albuterol (PROAIR HFA) 108 (90 BASE) MCG/ACT inhaler Inhale 1-2 puffs into the lungs every 6 (six) hours as needed for wheezing or shortness of breath. 1 Inhaler 3  . albuterol (PROVENTIL) (2.5 MG/3ML) 0.083% nebulizer solution Take 3 mLs (2.5 mg total) by nebulization every 6 (six) hours as needed for wheezing or shortness of breath. 75 mL prn  . azelastine (ASTELIN) 137 MCG/SPRAY nasal spray Place 1 spray into the nose 2 (two) times daily. Use in each nostril as directed    . Beclomethasone Dipropionate (QNASL) 80 MCG/ACT AERS Place 1 spray into the nose daily. (Patient taking differently: Place 1 spray into the nose every morning. ) 1 Inhaler prn  . busPIRone (BUSPAR) 15 MG tablet Take 1 tablet by mouth Twice daily.    . carbamazepine (TEGRETOL) 200 MG tablet Take 200 mg by mouth 2 (two) times daily.    . Ciclesonide (ZETONNA) 37 MCG/ACT AERS 2 sprays daily.    . CRESTOR 20 MG tablet Take 20 mg by mouth every morning.     . cyanocobalamin (,VITAMIN B-12,) 1000 MCG/ML injection Inject 1,000 mcg into the muscle every 30 (thirty) days. Mid-month    . diazepam (VALIUM) 5 MG tablet Take 5 mg by mouth at bedtime as needed for sedation.     . fluticasone (FLONASE) 50 MCG/ACT nasal spray Place 1-2 sprays into the nose at bedtime. 16 g 6  . gabapentin (NEURONTIN) 300 MG capsule Take 600 mg by mouth 2 (two) times daily.     Marland Kitchen. glimepiride (AMARYL) 2 MG tablet Take 2 mg by mouth daily before breakfast.    . guaiFENesin-codeine (CHERATUSSIN AC) 100-10 MG/5ML syrup Take 5 mLs by mouth 4 (four) times daily as needed for cough. 200 mL 0  . hyoscyamine (LEVSIN, ANASPAZ) 0.125 MG tablet Take 0.125 mg by  mouth every 6 (six) hours as needed.    Marland Kitchen. ipratropium (ATROVENT HFA) 17 MCG/ACT inhaler Inhale 2 puffs into the lungs 4 (four) times daily. As needed for wheeze, cough, chest tightness 1 Inhaler prn  . ipratropium (ATROVENT) 0.02 % nebulizer solution Take 2.5 mLs (0.5 mg total) by nebulization every 6 (six) hours as needed for wheezing or shortness of breath. 75 mL 12  . ipratropium (ATROVENT) 0.06 % nasal spray Place 2 sprays into the nose 4 (four) times daily. If needed for drainage 15 mL prn  . levalbuterol (XOPENEX HFA) 45 MCG/ACT inhaler Inhale 1-2 puffs into the lungs every 6 (six) hours as needed for wheezing or shortness of breath. 1 Inhaler prn  . loratadine (CLARITIN) 10 MG tablet Take 10 mg by mouth every morning.     Marland Kitchen. losartan-hydrochlorothiazide (HYZAAR) 100-25 MG per tablet Take 1 tablet by mouth daily.    . meclizine (ANTIVERT) 25 MG tablet Take 25 mg by mouth 3 (three) times daily as needed for dizziness.    . metFORMIN (GLUCOPHAGE) 1000 MG tablet Take 1,000 mg by mouth 2 (two) times daily.    . mometasone-formoterol (DULERA) 200-5 MCG/ACT AERO Inhale 2 puffs into the lungs 2 (two) times daily. 1 Inhaler prn  . montelukast (SINGULAIR) 10 MG tablet Take 1 tablet (10 mg total) by  mouth daily. (Patient taking differently: Take 10 mg by mouth every morning. ) 90 tablet 3  . NEXIUM 40 MG capsule Take 1 capsule by mouth every morning.     . polyvinyl alcohol (LIQUIFILM TEARS) 1.4 % ophthalmic solution Place 1 drop into both eyes 2 (two) times daily as needed for dry eyes.    Marland Kitchen PRESCRIPTION MEDICATION Inject 1 each into the skin every 14 (fourteen) days. Allergy shot.    . promethazine-codeine (PHENERGAN WITH CODEINE) 6.25-10 MG/5ML syrup Take 5 mLs by mouth every 6 (six) hours as needed for cough. 200 mL 0  . Respiratory Therapy Supplies (FLUTTER) DEVI Blow through 4 times, repeat three times daily 1 each 0  . sertraline (ZOLOFT) 100 MG tablet Take 200 mg by mouth every morning.     .  tiotropium (SPIRIVA) 18 MCG inhalation capsule Place 1 capsule (18 mcg total) into inhaler and inhale daily. 30 capsule 2  . tiZANidine (ZANAFLEX) 4 MG tablet Take 1 tablet (4 mg total) by mouth every 6 (six) hours as needed for muscle spasms. 40 tablet 0  . Vitamin D, Ergocalciferol, (DRISDOL) 50000 UNITS CAPS Take 1 capsule by mouth once a week. Monday     No current facility-administered medications on file prior to visit.    No Known Allergies

## 2015-09-12 NOTE — Telephone Encounter (Signed)
Ok to refill the cheratussin

## 2015-09-12 NOTE — Telephone Encounter (Signed)
LM for pt x 1  

## 2015-09-13 NOTE — Telephone Encounter (Signed)
Patient returning call, CB is [phone].

## 2015-09-16 MED ORDER — GUAIFENESIN-CODEINE 100-10 MG/5ML PO SYRP: 5.0000 mL | ORAL_SOLUTION | Freq: Four times a day (QID) | ORAL | Status: DC | PRN

## 2015-09-16 NOTE — Telephone Encounter (Signed)
rx called in to pharmacy.  Pt aware.  Nothing further needed.  

## 2015-09-19 DIAGNOSIS — G4733 Obstructive sleep apnea (adult) (pediatric): Secondary | ICD-10-CM

## 2015-09-25 DIAGNOSIS — G4733 Obstructive sleep apnea (adult) (pediatric): Secondary | ICD-10-CM

## 2015-09-26 ENCOUNTER — Other Ambulatory Visit: Payer: Self-pay | Admitting: *Deleted

## 2015-09-26 DIAGNOSIS — G4733 Obstructive sleep apnea (adult) (pediatric): Secondary | ICD-10-CM

## 2015-10-16 ENCOUNTER — Telehealth: Payer: Self-pay | Admitting: Internal Medicine

## 2015-10-16 DIAGNOSIS — G4733 Obstructive sleep apnea (adult) (pediatric): Secondary | ICD-10-CM

## 2015-10-16 NOTE — Telephone Encounter (Signed)
LVM for pt to return call

## 2015-10-17 ENCOUNTER — Telehealth: Payer: Self-pay | Admitting: Internal Medicine

## 2015-10-17 DIAGNOSIS — G4733 Obstructive sleep apnea (adult) (pediatric): Secondary | ICD-10-CM

## 2015-10-17 MED ORDER — GUAIFENESIN-CODEINE 100-10 MG/5ML PO SYRP: 5.0000 mL | ORAL_SOLUTION | Freq: Four times a day (QID) | ORAL | Status: DC | PRN

## 2015-10-17 NOTE — Telephone Encounter (Signed)
Pt returning call [phone]

## 2015-10-17 NOTE — Telephone Encounter (Signed)
Spoke with pt. States that she has not received her CPAP supplies. She had a home sleep study done on 09/19/15. An order was placed back in April to switch home care companies due to insurance. The pt has not heard from [facility] about her supplies. An order will be placed for this and it will be sent to [facility]. Pt is aware of this. Nothing further was needed.

## 2015-10-17 NOTE — Telephone Encounter (Signed)
Spoke with pt. She is aware that we will call in her prescription. Rx has been called in. Nothing further was needed.

## 2015-10-17 NOTE — Telephone Encounter (Signed)
Spoke with pt. She states that he she is having issues with her allergies. Reports coughing and PND. She requests a refill on Cheratussin cough syrup. This was last refilled on 09/16/15 #27900mL.  CY - please advise on refill. Thanks.

## 2015-10-17 NOTE — Telephone Encounter (Signed)
Ok to refill 

## 2015-10-17 NOTE — Telephone Encounter (Signed)
Order was placed today for CPAP supplies  Note    [facility] >> pt is in need of new CPAP supplies (mask of choice, head gear, filters, tubing, etc)     Per Larita FifeLynn at [facility], the patient was never set up with a CPAP machine through them d/t sleep study not showing an AHI's. Per Larita FifeLynn, the only sleep study they have on file is from 2001. I advised that the patient had a sleep study 09/18/05 (HST) and it showed 37.5 AHI/hour. Larita FifeLynn states that she needs a new order for CPAP set up with CPAP supplies order combined and fax that to them with 08/2015 OV notes and HST results. The patient will be set up once they receive everything they need.  [facility].

## 2016-01-07 ENCOUNTER — Encounter: Payer: Self-pay | Admitting: Internal Medicine

## 2016-01-07 ENCOUNTER — Ambulatory Visit (INDEPENDENT_AMBULATORY_CARE_PROVIDER_SITE_OTHER): Payer: Medicare Other | Admitting: Internal Medicine

## 2016-01-07 VITALS — BP 124/76 | HR 79 | Ht 68.0 in | Wt 279.6 lb

## 2016-01-07 DIAGNOSIS — J383 Other diseases of vocal cords: Secondary | ICD-10-CM | POA: Diagnosis not present

## 2016-01-07 DIAGNOSIS — J479 Bronchiectasis, uncomplicated: Secondary | ICD-10-CM

## 2016-01-07 DIAGNOSIS — R911 Solitary pulmonary nodule: Secondary | ICD-10-CM

## 2016-01-07 MED ORDER — PROMETHAZINE-CODEINE 6.25-10 MG/5ML PO SYRP: 5.0000 mL | ORAL_SOLUTION | Freq: Four times a day (QID) | ORAL | 0 refills | Status: DC | PRN

## 2016-01-07 NOTE — Patient Instructions (Signed)
Order- schedule CT chest high resolution, no contrast   Dx bronchiectasis, lung nodule  Ok to continue CPAP auto 4-11, mask of choice, humidifier, supplies, [employer]  Dx OSA  Please call as needed

## 2016-01-07 NOTE — Progress Notes (Signed)
Patient ID: Dominique Robbins, female    DOB: 10/22/1950, 65 y.o.   MRN: [id number]  HPI 5/31/12Ranae Robbins- 65 yo never smoker followed for asthma, allergic rhinitis, OSA, vocal cord paresis/ hoarseness.   06/07/14- 65 yo never smoker followed for asthma/ bronchiectasis, allergic rhinitis,chronic cough,  OSA, vocal cord paresis/ hoarseness, lung nodule FOLLOWS FOR: Wears CPAP  11 every night through [facility]. Continues to have cough and wants cough syrup Rx. Less hoarse recently. CT chest 05/14/14  Reviewed with her IMPRESSION: 1. 3 mm right middle lobe nodule as described on the report of the prior CT from [facility]. No pulmonary nodules elsewhere in either lung. Please see below for followup recommendations. 2. Mild bronchiectasis involving the posterior basal segment of the right lower lobe, with several of the bronchi filled with mucus. No evidence of bronchiectasis elsewhere. 3. Mild changes of COPD/emphysema. No acute cardiopulmonary disease. 4. Hepatic steatosis. If the patient is at high risk for bronchogenic carcinoma, follow-up chest CT at 1 year is recommended. If the patient is at low risk, no follow-up is needed. This recommendation follows the consensus statement: Guidelines for Management of Small Pulmonary Nodules Detected on CT Scans: A Statement from the Fleischner Society as published in Radiology 2005; 237:395-400. Electronically Signed  By: Hulan Saashomas Lawrence M.D.  On: 05/14/2014 10:03  01/07/15- 65 yo never smoker followed for asthma/ bronchiectasis, allergic rhinitis,chronic cough/ bronchectasis/ COPD,  OSA, vocal cord paresis/ hoarseness, lung nodule FOLLOWS FOR: Pt states her breathing has worsened since the hot weather. Pt c/o dry cough. Pt denies CP/tightness. Pt stated she is wearing CPAP 11/[facility]  nightly for at least 8 hours. Pt denies issues with mask, pressure or machine.  She [place] allergy [place] in [place] from [facility]. She refers to dyspnea on exertion, noticed mainly with housework and similar activities around the home, associated particularly with the hot weather. She lives with smoker and admits ongoing secondhand smoke exposure although she tries to allow him to smoke only in the kitchen.  07/10/2015-65 year old female never smoker followed for asthma/bronchiectasis/chronic cough, allergic rhinitis, vocal cord paralysis/hoarseness, lung nodule, OSA CPAP 11/[facility] FOLLOWS FOR: Wears CPAP every night for at least 8-9 hours; DME: [facility]-need order for new supplies and DL as patient has not recent DL or in [facility]-pt did not bring her card with her today. Review PFT with patient as well. She feels well controlled with CPAP and says life is better using it. Nebulizer medication-albuterol and Xopenex both give bad shakes. Sustained tremor is noticed here about an hour after she was given 4 puffs of albuterol for her PFT. No palpitation. PFT: 07/10/2015-mild obstructive airways disease with minimal/insignificant response to bronchodilator, diffusion mildly reduced. FVC 2.98/82%, FEV1 2.32/83%, FEV1/FVC 0.78, TLC 83%, DLCO 66%  08/22/2015-65 year old female never smoker followed for asthma/bronchiectasis/chronic cough, allergic rhinitis, vocal cord paralysis/hoarseness, lung nodule, OSA CPAP 11/[facility]  For 2 weeks complains of chest tightness, increased dry cough without fever, more short of breath. Doesn't feel "sick". Denies chest pain, palpitations, leg pain or swelling. Prednisone taper did not help. Can't sleep without CPAP. It is comfortable and compliance is good.  01/07/2016-65 year old female never smoker followed for asthma/bronchiectasis/chronic cough, allergic rhinitis, vocal cord paralysis/hoarseness, lung nodule, OSA CPAP auto 4-11/[facility] FOLLowS FOR: DME: [facility]. Pt wears CPAP nightly and pressure works well for patient. No new supplies needed. Download confirms excellent compliance 100%/4 hours,  excellent control AHI 1.3. She continues to cough a lot as she has for years, productive  of variable white sputum. Using nebulizer about twice a week. Flutter device has been of limited benefit of helping clear airways. CT chest 08/27/2015 I think there are areas of early bronchiectasis in both lung bases in the areas of atelectasis.  Review of Systems- see HPI Constitutional:   No-   weight loss, night sweats, fevers, chills, fatigue, lassitude. HEENT:   No-  headaches, difficulty swallowing, tooth/dental problems, sore throat,       No-  sneezing, itching, ear ache, nasal congestion, post nasal drip,  CV:  No-   chest pain, orthopnea, PND, swelling in lower extremities, anasarca, dizziness, palpitations Resp: +shortness of breath with exertion or at rest.              +  productive cough,  + non-productive cough,  No- coughing up of blood.              No-   change in color of mucus.  + wheezing.   Skin: No-   rash or lesions. GI:  No-   heartburn, indigestion, abdominal pain, nausea, vomiting,  GU MS:  Neuro-     nothing unusual Psych:  No- change in mood or affect. No depression or anxiety.  No memory loss.    Objective:   Physical Exam General- Alert, Oriented, Affect-appropriate, Distress- none acute, +obese Skin- rash-none, lesions- none, excoriation- none Lymphadenopathy- none Head- atraumatic            Eyes- Gross vision intact, PERRLA, conjunctivae clear secretions            Ears- Hearing, canals-normal            Nose- Clear, no-Septal dev, mucus, polyps, erosion, perforation.              Throat- Mallampati IV , mucosa clear , drainage- none, tonsils- atrophic, + hoarse,  + Throat clearing               Neck- flexible , trachea midline, no stridor , thyroid nl, carotid no bruit Chest - symmetrical excursion , unlabored           Heart/CV- RRR , no murmur , no gallop  , no rub, nl s1 s2                           - JVD- none , edema- none, stasis changes- none, varices-  none           Lung- clear to P&A, wheeze- none, cough+ dry , dullness-none, rub- none           Chest wall-  Abd- Br/ Gen/ Rectal- Not done, not indicated Extrem- cyanosis- none, clubbing, none, atrophy- none, strength- nl. +Cane Neuro- grossly intact to observation

## 2016-01-08 NOTE — Assessment & Plan Note (Signed)
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

## 2016-01-08 NOTE — Assessment & Plan Note (Signed)
I think she is developing chronic bronchitis with bronchiectasis in the lower lobes. Cough is impaired by her vocal cord paralysis and there is high probability of recurrent low-grade aspiration. Flutter device and her efforts at airway clearance have been insufficient. She may be a good candidate for a pneumatic vest for airway clearance, before this gets worse.

## 2016-01-09 ENCOUNTER — Ambulatory Visit (INDEPENDENT_AMBULATORY_CARE_PROVIDER_SITE_OTHER): Payer: Medicare Other

## 2016-01-09 DIAGNOSIS — R911 Solitary pulmonary nodule: Secondary | ICD-10-CM

## 2016-01-09 DIAGNOSIS — J479 Bronchiectasis, uncomplicated: Secondary | ICD-10-CM | POA: Diagnosis not present

## 2016-01-22 ENCOUNTER — Telehealth: Payer: Self-pay | Admitting: Internal Medicine

## 2016-01-22 DIAGNOSIS — J479 Bronchiectasis, uncomplicated: Secondary | ICD-10-CM

## 2016-01-22 NOTE — Telephone Encounter (Signed)
Please advise Dr Maple HudsonYoung on HRCT results. Thanks.

## 2016-01-23 NOTE — Telephone Encounter (Signed)
We will go over the CT scan report in more detail when she returns. For now, it does show evidence of bronchiectasis which is a kind of scarring. She has had chronic productive cough meeting the criteria, so I recommend we order a Pneumatic Vest device through the [facility] to help improve secretion clearance insufficiently helped by Flutter device. Dx bronchiectasis.

## 2016-01-23 NOTE — Telephone Encounter (Signed)
Called spoke with pt. Reviewed results and recs. Pt voiced understanding and had no further questions. Vest order has been placed. Nothing further needed.

## 2016-01-31 ENCOUNTER — Telehealth: Payer: Self-pay | Admitting: Internal Medicine

## 2016-01-31 NOTE — Telephone Encounter (Signed)
lmomtcb x1 

## 2016-01-31 NOTE — Telephone Encounter (Signed)
lmtcb x2 

## 2016-01-31 NOTE — Telephone Encounter (Signed)
Patient returning call - she can be reached at [phone] -pr

## 2016-02-03 ENCOUNTER — Encounter: Payer: Self-pay | Admitting: Internal Medicine

## 2016-02-03 MED ORDER — PROMETHAZINE-CODEINE 6.25-10 MG/5ML PO SYRP: 5.0000 mL | ORAL_SOLUTION | Freq: Four times a day (QID) | ORAL | 0 refills | Status: DC | PRN

## 2016-02-03 NOTE — Telephone Encounter (Signed)
Ok to refill 

## 2016-02-03 NOTE — Telephone Encounter (Signed)
Spoke with pt. She is requesting a refill on Promethazine-Codeine cough syrup. This was last refilled by CY on 01/07/16 #26100mL. Her last OV with CY was on 01/07/16.  CY - please advise on refill. Thanks.

## 2016-02-03 NOTE — Telephone Encounter (Signed)
Spoke with pt. She is aware that we will be refilling this medication. Rx has been called in. Nothing further was needed.

## 2016-02-11 IMAGING — CR DG CHEST 2V
2 series · 2 of 2 positions shown · non-contrast
Comparison: 02/15/2017

CLINICAL DATA: History of asthma.  COPD.  Preoperative chest x-ray.

EXAM:
CHEST  2 VIEW

[w chest pa]
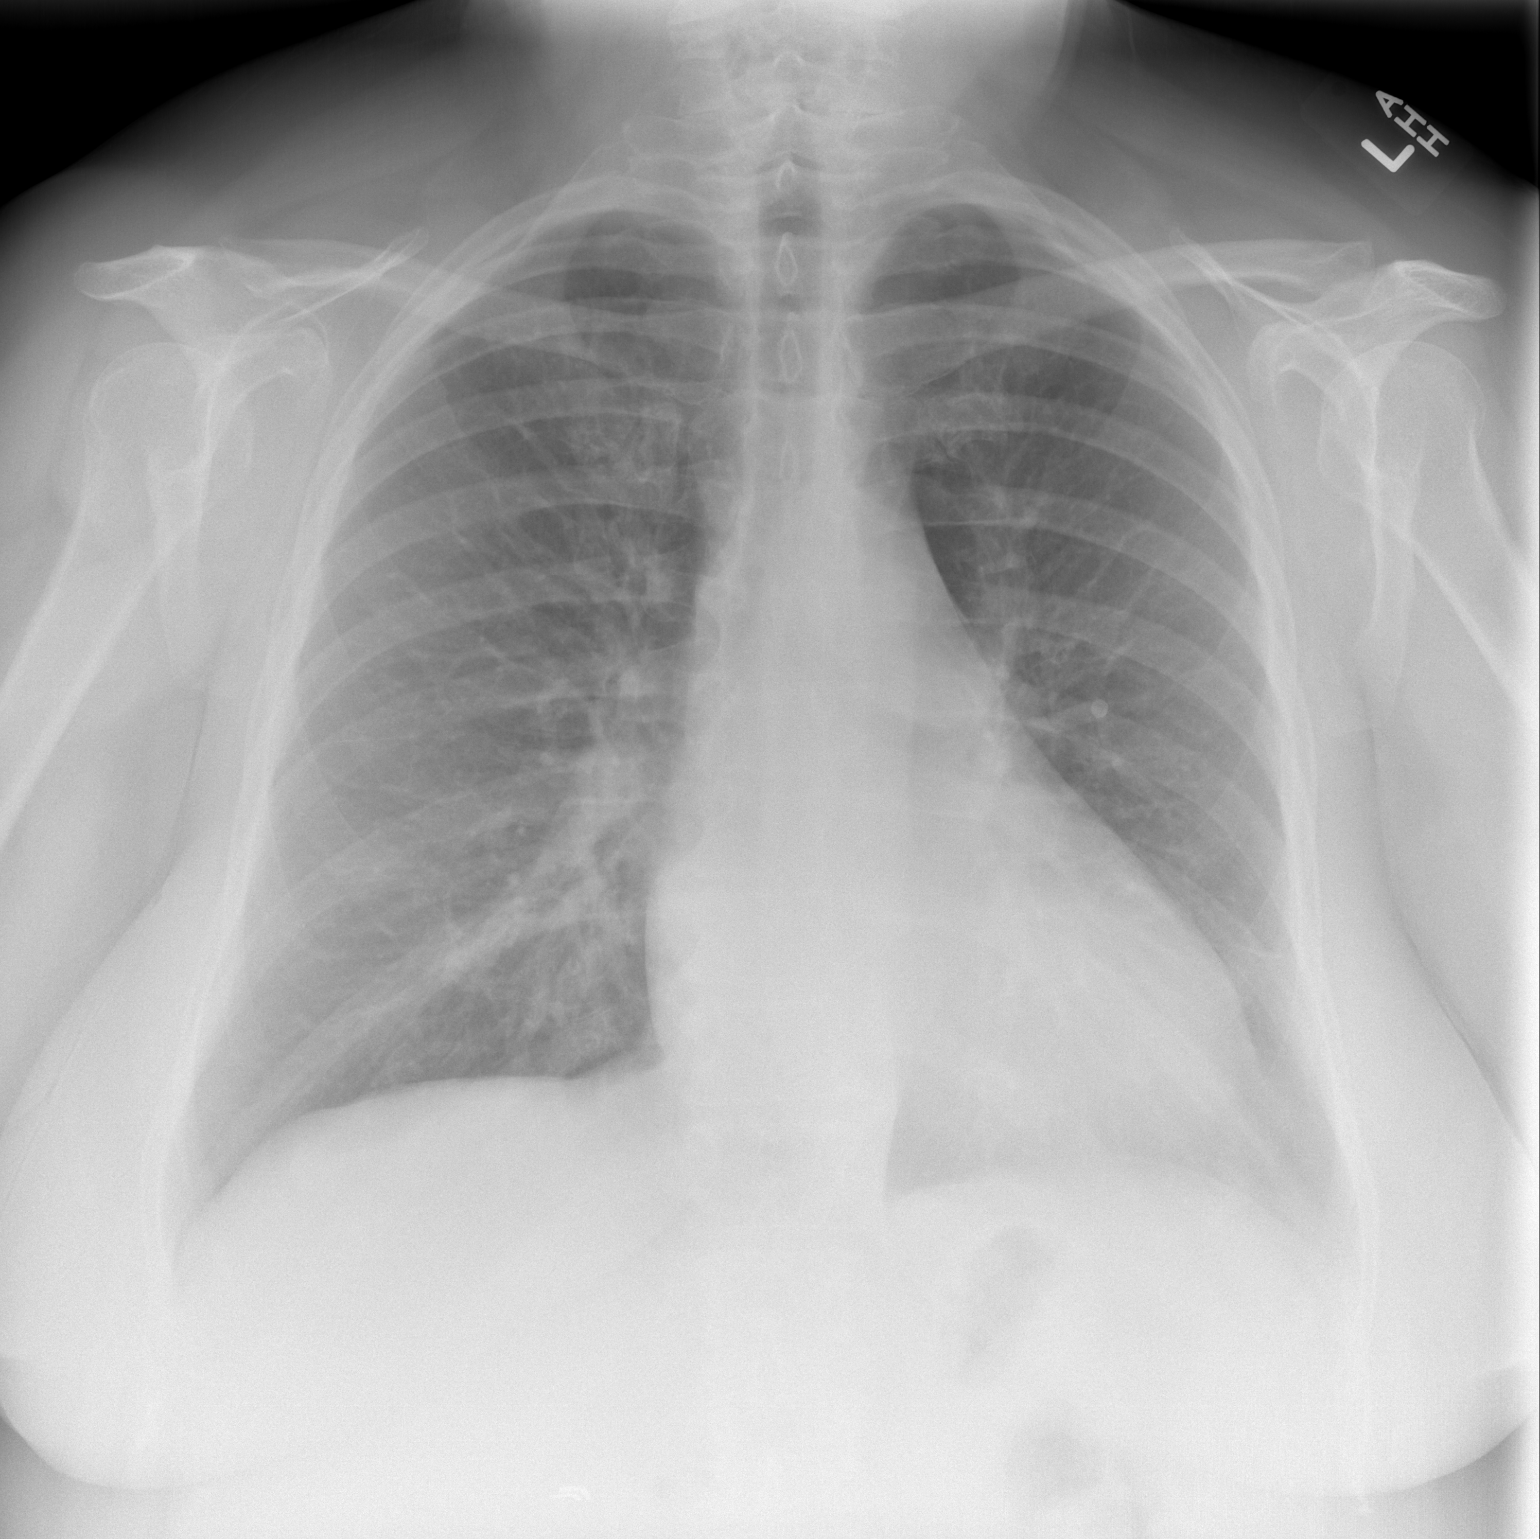

[w chest lat]
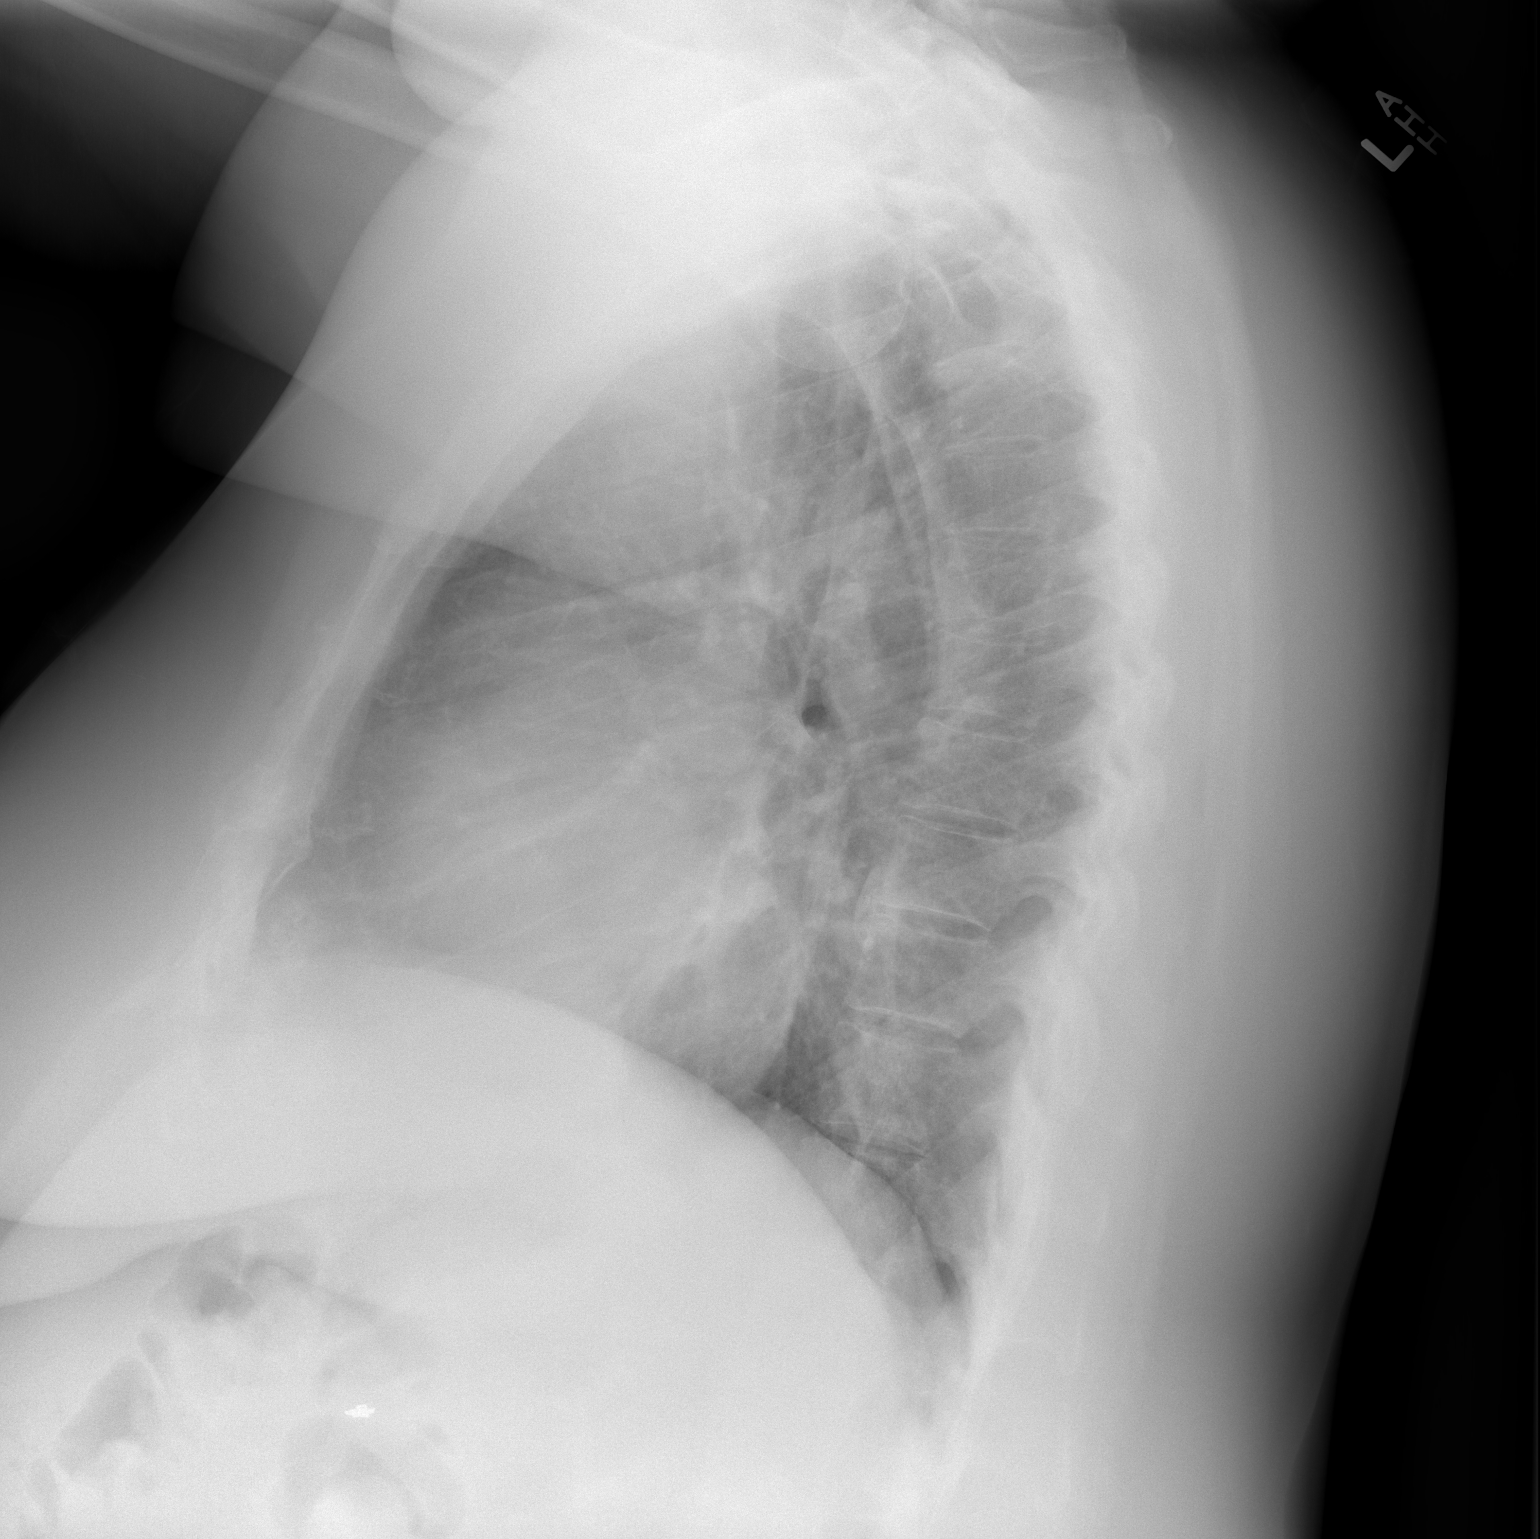

[2 of 2 positions shown; findings below may reference images not displayed]

FINDINGS: Mediastinum and hilar structures normal. Cardiomegaly is normal
pulmonary vascularity. No focal infiltrate. No pleural effusion or
pneumothorax. Degenerative changes thoracic spine. Surgical clips
upper abdomen.
IMPRESSION: Mild cardiomegaly, no CHF.  No acute pulmonary disease.

## 2016-02-28 ENCOUNTER — Telehealth: Payer: Self-pay | Admitting: Internal Medicine

## 2016-02-28 NOTE — Telephone Encounter (Signed)
cough syrup with codeine  (Newest Message First)  February 03, 2016  Lorel MonacoLindsay C Lemons, CMA      9:58 AM  Note    Spoke with pt. She is aware that we will be refilling this medication. Rx has been called in. Nothing further was needed.    Waymon Budgelinton D Young, MD  to [facility] Triage Pool     9:35 AM  Note    Ok to refill     Pt aware that CY is back in the office on Monday 03/02/16 and will get approval/denial for Rx request.

## 2016-03-03 ENCOUNTER — Telehealth: Payer: Self-pay | Admitting: Internal Medicine

## 2016-03-03 MED ORDER — PROMETHAZINE-CODEINE 6.25-10 MG/5ML PO SYRP: 5.0000 mL | ORAL_SOLUTION | Freq: Four times a day (QID) | ORAL | 0 refills | Status: DC | PRN

## 2016-03-03 NOTE — Telephone Encounter (Signed)
Patient states cough syrup was to be called in yesterday to [facility] on [address] in [place] but has not been done yet.  CB is [phone]

## 2016-03-03 NOTE — Telephone Encounter (Signed)
Called and spoke with pt and she stated that the cough meds were to be called in yesterday and the pharmacy did not have this.  This medication has been called to her pharmacy and nothing further is needed.

## 2016-05-07 ENCOUNTER — Ambulatory Visit (INDEPENDENT_AMBULATORY_CARE_PROVIDER_SITE_OTHER): Payer: Medicare Other | Admitting: Internal Medicine

## 2016-05-07 ENCOUNTER — Encounter: Payer: Self-pay | Admitting: Internal Medicine

## 2016-05-07 VITALS — BP 130/72 | HR 85 | Ht 68.0 in | Wt 291.8 lb

## 2016-05-07 DIAGNOSIS — J479 Bronchiectasis, uncomplicated: Secondary | ICD-10-CM

## 2016-05-07 DIAGNOSIS — J383 Other diseases of vocal cords: Secondary | ICD-10-CM

## 2016-05-07 DIAGNOSIS — R0609 Other forms of dyspnea: Secondary | ICD-10-CM | POA: Diagnosis not present

## 2016-05-07 DIAGNOSIS — R0600 Dyspnea, unspecified: Secondary | ICD-10-CM

## 2016-05-07 DIAGNOSIS — G4733 Obstructive sleep apnea (adult) (pediatric): Secondary | ICD-10-CM

## 2016-05-07 MED ORDER — PROMETHAZINE-CODEINE 6.25-10 MG/5ML PO SYRP: 5.0000 mL | ORAL_SOLUTION | Freq: Four times a day (QID) | ORAL | 0 refills | Status: DC | PRN

## 2016-05-07 NOTE — Patient Instructions (Addendum)
Script printed refill cough syrup  Glad the Vest helps  We can continue CPAP auto 4-11/ [facility], mask of choice, humidifier, supplies, [facility]   Dx OSA  Please call if we can help

## 2016-05-07 NOTE — Progress Notes (Signed)
Patient ID: Dominique PalmsElizabeth D Mascaro, female    DOB: 08/07/1950, 65 y.o.   MRN: [id number]  HPI 10/16/10- 65 yo never smoker followed for asthma/ bronchiectasis, lung nodules, allergic rhinitis, OSA, vocal cord paresis/ hoarseness. PFT: 07/10/2015-mild obstructive airways disease with minimal/insignificant response to bronchodilator, diffusion mildly reduced. FVC 2.98/82%, FEV1 2.32/83%, FEV1/FVC 0.78, TLC 83%, DLCO 66%  ---------------------------------------------------------------   01/07/2016-65 year old female never smoker followed for asthma/bronchiectasis/chronic cough, allergic rhinitis, vocal cord paralysis/hoarseness, lung nodule, OSA CPAP auto 4-11/[facility] FOLLowS FOR: DME: [facility]. Pt wears CPAP nightly and pressure works well for patient. No new supplies needed. Download confirms excellent compliance 100%/4 hours, excellent control AHI 1.3. She continues to cough a lot as she has for years, productive of variable white sputum. Using nebulizer about twice a week. Flutter device has been of limited benefit of helping clear airways. CT chest 08/27/2015 I think there are areas of early bronchiectasis in both lung bases in the areas of atelectasis.  09/06/2015-65 year old female never smoker for cough, allergic rhinitis, vocal cord paralysis/hoarseness, lung nodule, OSA CPAP auto 4-11/[facility] Pneumatic Vest FOLLOWS FOR:Pt states her breathing has been doing well; using Vest and likes it. Pt denies any SOB, cough, congestion, or wheezing. Doing well with CPAP Except mask leaks. "Can't sleep without it"  Review of Systems- see HPI Constitutional:   No-   weight loss, night sweats, fevers, chills, fatigue, lassitude. HEENT:   No-  headaches, difficulty swallowing, tooth/dental problems, sore throat,       No-  sneezing, itching, ear ache, nasal congestion, post nasal drip,  CV:  No-   chest pain, orthopnea, PND, swelling in lower extremities, anasarca, dizziness, palpitations Resp: +shortness of  breath with exertion or at rest.              +  productive cough,  + non-productive cough,  No- coughing up of blood.              No-   change in color of mucus.  + wheezing.   Skin: No-   rash or lesions. GI:  No-   heartburn, indigestion, abdominal pain, nausea, vomiting,  GU MS:  Neuro-     nothing unusual Psych:  No- change in mood or affect. No depression or anxiety.  No memory loss.    Objective:   Physical Exam General- Alert, Oriented, Affect-appropriate, Distress- none acute, +obese Skin- rash-none, lesions- none, excoriation- none Lymphadenopathy- none Head- atraumatic            Eyes- Gross vision intact, PERRLA, conjunctivae clear secretions            Ears- Hearing, canals-normal            Nose- Clear, no-Septal dev, mucus, polyps, erosion, perforation.              Throat- Mallampati IV , mucosa clear , drainage- none, tonsils- atrophic, + hoarse,  + Throat clearing               Neck- flexible , trachea midline, no stridor , thyroid nl, carotid no bruit Chest - symmetrical excursion , unlabored           Heart/CV- RRR , no murmur , no gallop  , no rub, nl s1 s2                           - JVD- none , edema- none, stasis changes- none, varices- none  Lung- clear to P&A, wheeze- none, cough-None , dullness-none, rub- none           Chest wall-  Abd- Br/ Gen/ Rectal- Not done, not indicated Extrem- cyanosis- none, clubbing, none, atrophy- none, strength- nl. +Cane Neuro- grossly intact to observation

## 2016-05-18 NOTE — Assessment & Plan Note (Signed)
[facility]

## 2016-05-18 NOTE — Assessment & Plan Note (Signed)
Candidate for bariatric counseling

## 2016-05-18 NOTE — Assessment & Plan Note (Signed)
Much better control now with pneumatic vest helping pulmonary secretion clearance. Chest sounds clear stuff heard her in a long time.

## 2016-05-18 NOTE — Assessment & Plan Note (Signed)
Discussed mask adjustment and replacement. Otherwise CPAP settings are very good

## 2016-06-17 ENCOUNTER — Telehealth: Payer: Self-pay | Admitting: Internal Medicine

## 2016-06-17 NOTE — Telephone Encounter (Signed)
ashtyn can you check to see if there is a paper from [facility] for the pt to get cpap supplies?  thanks

## 2016-06-18 NOTE — Telephone Encounter (Signed)
This form is not located in VS look at. I have left a message with Irving Burtonmily at [facility] to refax this to triage. Will await fax.

## 2016-06-22 NOTE — Telephone Encounter (Signed)
This is a CY pt.  Synetta Failnita has forms for cpap supplies.  Form left on CY's cart for signature.

## 2016-06-22 NOTE — Telephone Encounter (Signed)
Will send to Dr Maple HudsonYoung as Lorain ChildesFYI that this is on his cart and also will forward to Katie's box to be followed

## 2016-06-23 NOTE — Telephone Encounter (Signed)
I have called [facility] and left a message to have this refaxed to triage. Will await fax.

## 2016-06-23 NOTE — Telephone Encounter (Signed)
[employer] [employer](chris) [phone] calling to check on the status of cpap supplies order.Dominique GriffinsStanley A Robbins

## 2016-06-23 NOTE — Telephone Encounter (Signed)
Spoke with Caryn BeeKevin at [facility], aware that we are awaiting form to be filled out by CY before we fax it back. CY please advise when form has been completed.  Thanks!

## 2016-06-23 NOTE — Telephone Encounter (Signed)
Too many nurse changes for me recently; I don't have it.- if form wasn't scanned we will need them to send another.

## 2016-06-25 ENCOUNTER — Telehealth: Payer: Self-pay | Admitting: Internal Medicine

## 2016-06-25 MED ORDER — PROMETHAZINE-CODEINE 6.25-10 MG/5ML PO SYRP: 5.0000 mL | ORAL_SOLUTION | Freq: Four times a day (QID) | ORAL | 0 refills | Status: DC | PRN

## 2016-06-25 NOTE — Telephone Encounter (Signed)
Spoke with pt. She is requesting a refill on Promethazine-Codeine cough syrup. This has last refilled on 05/13/16 #26900mL by CY. Her last OV was on 05/07/16 with CY. Has pending OV on 11/05/16 with CY.  CY - please advise on refill. Thanks.

## 2016-06-25 NOTE — Telephone Encounter (Signed)
Ok to refill cough syrup 

## 2016-06-25 NOTE — Telephone Encounter (Signed)
This form can't be located. I have left another message with Irving Burtonmily from [facility] to have this refaxed to triage. Will await fax.

## 2016-06-25 NOTE — Telephone Encounter (Signed)
Spoke with pt. She is aware that we will refill this medication for her. Rx has been called in. Nothing further was needed. 

## 2016-06-30 NOTE — Telephone Encounter (Signed)
Called and lmom for Dominique Robbins from [facility] to have this form faxed to the fax machine up front with the attn to CY.

## 2016-07-06 NOTE — Telephone Encounter (Signed)
CY has not received any forms. I have lm for Irving Burtonmily with [facility] to return our call.

## 2016-07-06 NOTE — Telephone Encounter (Signed)
done

## 2016-09-21 ENCOUNTER — Telehealth: Payer: Self-pay | Admitting: Internal Medicine

## 2016-09-21 MED ORDER — PROMETHAZINE-CODEINE 6.25-10 MG/5ML PO SYRP: 5.0000 mL | ORAL_SOLUTION | Freq: Four times a day (QID) | ORAL | 0 refills | Status: DC | PRN

## 2016-09-21 NOTE — Telephone Encounter (Signed)
Patient returned phone call.Erica R Taylor ° °

## 2016-09-21 NOTE — Telephone Encounter (Signed)
Rx has been placed up front in brown folder for pickup. Pt is aware and voiced her understanding. Nothing further needed.  

## 2016-09-21 NOTE — Telephone Encounter (Signed)
Ok to refill 

## 2016-09-21 NOTE — Telephone Encounter (Signed)
Pt is requesting refill on Promethazine-codeine Last refilled on 06-25-16 #2500ml 0 refills take q6h prn for cough. Last OV 05/07/16, pending OV 11/05/16. Pt reports of non prod cough, mild wheezing & chest tightness.  CY please advise. Thanks.

## 2016-09-21 NOTE — Telephone Encounter (Addendum)
Rx printed and placed on Dominique Robbins's chart to be signed.  Dominique Robbins please return to triage once signed. Thanks.

## 2016-09-21 NOTE — Telephone Encounter (Signed)
atc pt x1-mailbox is full. Promethazine-codeine- refilled on 06-25-16 #26800ml 0 refills take q6h prn for cough. Last OV 05/07/16, pending OV 11/05/16.

## 2016-11-02 ENCOUNTER — Encounter: Payer: Self-pay | Admitting: Internal Medicine

## 2016-11-05 ENCOUNTER — Encounter: Payer: Self-pay | Admitting: Internal Medicine

## 2016-11-05 ENCOUNTER — Ambulatory Visit (INDEPENDENT_AMBULATORY_CARE_PROVIDER_SITE_OTHER): Payer: Medicare Other | Admitting: Internal Medicine

## 2016-11-05 VITALS — BP 118/76 | HR 67 | Ht 68.0 in | Wt 239.2 lb

## 2016-11-05 DIAGNOSIS — J383 Other diseases of vocal cords: Secondary | ICD-10-CM

## 2016-11-05 DIAGNOSIS — G4733 Obstructive sleep apnea (adult) (pediatric): Secondary | ICD-10-CM

## 2016-11-05 DIAGNOSIS — J479 Bronchiectasis, uncomplicated: Secondary | ICD-10-CM

## 2016-11-05 MED ORDER — PROMETHAZINE-CODEINE 6.25-10 MG/5ML PO SYRP: 5.0000 mL | ORAL_SOLUTION | Freq: Four times a day (QID) | ORAL | 0 refills | Status: DC | PRN

## 2016-11-05 NOTE — Progress Notes (Signed)
Patient ID: Dominique Robbins, female    DOB: Oct 04, 1950, 66 y.o.   MRN: [id number]  HPI 10/16/10- 72 yo never smoker followed for asthma/ bronchiectasis, lung nodules, allergic rhinitis, OSA, vocal cord paresis/ hoarseness. PFT: 07/10/2015-mild obstructive airways disease with minimal/insignificant response to bronchodilator, diffusion mildly reduced. FVC 2.98/82%, FEV1 2.32/83%, FEV1/FVC 0.78, TLC 83%, DLCO 66% NPSG 04/06/2000-AHI 33/hour, desaturation to 81% Home Sleep Test 09/19/15-AHI 37.5/hour, desaturation to 84%, body weight 273 pounds.  ---------------------------------------------------------------  09/06/2015-66 year old female never smoker for cough, allergic rhinitis, vocal cord paralysis/hoarseness, lung nodule, OSA CPAP auto 4-11/[facility] Pneumatic Vest FOLLOWS FOR:Pt states her breathing has been doing well; using Vest and likes it. Pt denies any SOB, cough, congestion, or wheezing. Doing well with CPAP Except mask leaks. "Can't sleep without it"  11/05/16- 66 year old female never smoker for cough, allergic rhinitis, vocal cord paralysis/hoarseness/ LPR, lung nodule, OSA CPAP auto 4-11/[facility] FOLLOWS FOR: Pt states she gest increased DOE. Continues to have dry cough. Otherwise doing well. Spiriva, Dulera 200, Singulair, Atrovent/albuterol nebulizer solution, Atrovent HFA, albuterol HFA Home Sleep Test 09/19/15-AHI 37.5/hour, desaturation to 84%, body weight 273 pounds. This qualified her for replacement of old machine. CPAP remains comfortable "I like my CPAP" with no concerns. Download 87% compliance, AHI 1.3/hour.  Cough is constant, somewhat variable, with a lot of throat clearing which we know was associated with her vocal cord stents and LPR. Asthma component mild persistent but medications keep her well controlled.  Review of Systems- see HPI Constitutional:   No-   weight loss, night sweats, fevers, chills, fatigue, lassitude. HEENT:   No-  headaches, difficulty swallowing,  tooth/dental problems, sore throat,       No-  sneezing, itching, ear ache, nasal congestion, post nasal drip,  CV:  No-   chest pain, orthopnea, PND, swelling in lower extremities, anasarca, dizziness, palpitations Resp: +shortness of breath with exertion or at rest.                productive cough,  + non-productive cough,  No- coughing up of blood.              No-   change in color of mucus.  + wheezing.   Skin: No-   rash or lesions. GI:  No-   heartburn, indigestion, abdominal pain, nausea, vomiting,  GU MS:  Neuro-     nothing unusual Psych:  No- change in mood or affect. No depression or anxiety.  No memory loss.    Objective:   Physical Exam General- Alert, Oriented, Affect-appropriate, Distress- none acute, +obese Skin- rash-none, lesions- none, excoriation- none Lymphadenopathy- none Head- atraumatic            Eyes- Gross vision intact, PERRLA, conjunctivae clear secretions            Ears- Hearing, canals-normal            Nose- Clear, no-Septal dev, mucus, polyps, erosion, perforation.              Throat- Mallampati IV , mucosa clear , drainage- none, tonsils- atrophic, + hoarse,  + Throat clearing               Neck- flexible , trachea midline, no stridor , thyroid nl, carotid no bruit Chest - symmetrical excursion , unlabored           Heart/CV- RRR , no murmur , no gallop  , no rub, nl s1 s2                           -  JVD- none , edema- none, stasis changes- none, varices- none           Lung- clear to P&A, wheeze- none, cough-None , dullness-none, rub- none           Chest wall-  Abd- Br/ Gen/ Rectal- Not done, not indicated Extrem- cyanosis- none, clubbing, none, atrophy- none, strength- nl. +Cane Neuro- grossly intact to observation

## 2016-11-05 NOTE — Progress Notes (Signed)
DOE

## 2016-11-05 NOTE — Patient Instructions (Signed)
Try to rest your voice, use sips of liquids and throat lozenges to minimize throat-clearing which makes you hoarse.  We can continue CPAP/ [facility]   Auto 4-11, mask of choice, humidifier, supplies, AirView    Dx OSA   Ok to continue present inhalers  Please call as needed

## 2016-11-06 NOTE — Assessment & Plan Note (Signed)
Asthma/bronchiectasis component is controlled

## 2016-11-06 NOTE — Assessment & Plan Note (Signed)
Continued reminder to protect her voice using throat lozenges and sips and chin tuck to control LPR and minimize hoarseness.

## 2016-11-06 NOTE — Assessment & Plan Note (Signed)
Updated HST in 2017 did qualify her to replace old CPAP machine. She continues to benefit and says she doesn't like to sleep without it. Compliance download looks good.

## 2016-11-17 ENCOUNTER — Telehealth: Payer: Self-pay | Admitting: Internal Medicine

## 2016-11-17 MED ORDER — ALBUTEROL SULFATE HFA 108 (90 BASE) MCG/ACT IN AERS: 1.0000 | INHALATION_SPRAY | Freq: Four times a day (QID) | RESPIRATORY_TRACT | 5 refills | Status: DC | PRN

## 2016-11-17 NOTE — Telephone Encounter (Signed)
Spoke with pt. She is needing a refill on Proair. Rx has been sent in. Nothing further was needed. 

## 2016-11-20 ENCOUNTER — Ambulatory Visit
Admission: RE | Admit: 2016-11-20 | Discharge: 2016-11-20 | Disposition: A | Discharge status: Home / Self Care | Payer: Self-pay | Source: Ambulatory Visit | Attending: Internal Medicine | Admitting: Internal Medicine

## 2016-11-20 ENCOUNTER — Other Ambulatory Visit: Payer: Self-pay | Admitting: Internal Medicine

## 2016-11-20 DIAGNOSIS — R911 Solitary pulmonary nodule: Secondary | ICD-10-CM

## 2016-11-20 DIAGNOSIS — IMO0001 Reserved for inherently not codable concepts without codable children: Secondary | ICD-10-CM

## 2016-11-24 ENCOUNTER — Telehealth: Payer: Self-pay | Admitting: Internal Medicine

## 2016-11-24 DIAGNOSIS — G4733 Obstructive sleep apnea (adult) (pediatric): Secondary | ICD-10-CM

## 2016-11-24 NOTE — Telephone Encounter (Signed)
Spoke with patient. She stated that [facility] needed an order to continue to provide CPAP supplies. Advised patient that an order had been sent to [facility] instead. She stated that she is using [facility] now. Will send the order to [facility]. Patient verbalized understanding. Nothing else needed at time of call.

## 2016-12-16 ENCOUNTER — Ambulatory Visit
Admission: RE | Admit: 2016-12-16 | Discharge: 2016-12-16 | Disposition: A | Discharge status: Home / Self Care | Payer: Self-pay | Source: Ambulatory Visit | Attending: Internal Medicine | Admitting: Internal Medicine

## 2016-12-16 ENCOUNTER — Other Ambulatory Visit: Payer: Self-pay | Admitting: Internal Medicine

## 2016-12-16 DIAGNOSIS — IMO0001 Reserved for inherently not codable concepts without codable children: Secondary | ICD-10-CM

## 2016-12-16 DIAGNOSIS — R911 Solitary pulmonary nodule: Secondary | ICD-10-CM

## 2017-02-16 ENCOUNTER — Ambulatory Visit
Admission: RE | Admit: 2017-02-16 | Discharge: 2017-02-16 | Disposition: A | Discharge status: Home / Self Care | Payer: Self-pay | Source: Ambulatory Visit | Attending: Internal Medicine | Admitting: Internal Medicine

## 2017-02-16 ENCOUNTER — Other Ambulatory Visit: Payer: Self-pay | Admitting: Internal Medicine

## 2017-02-16 DIAGNOSIS — J45909 Unspecified asthma, uncomplicated: Secondary | ICD-10-CM

## 2017-04-15 ENCOUNTER — Other Ambulatory Visit: Payer: Self-pay | Admitting: Orthopedic Surgery

## 2017-04-15 DIAGNOSIS — M25561 Pain in right knee: Principal | ICD-10-CM

## 2017-04-15 DIAGNOSIS — G8929 Other chronic pain: Secondary | ICD-10-CM

## 2017-04-22 ENCOUNTER — Ambulatory Visit
Admission: RE | Admit: 2017-04-22 | Discharge: 2017-04-22 | Disposition: A | Discharge status: Home / Self Care | Payer: Medicare Other | Source: Ambulatory Visit | Attending: Orthopedic Surgery | Admitting: Orthopedic Surgery

## 2017-04-22 ENCOUNTER — Telehealth: Payer: Self-pay | Admitting: Internal Medicine

## 2017-04-22 DIAGNOSIS — M25561 Pain in right knee: Principal | ICD-10-CM

## 2017-04-22 DIAGNOSIS — G8929 Other chronic pain: Secondary | ICD-10-CM

## 2017-04-22 NOTE — Telephone Encounter (Signed)
ATC Shelly, no answer. Left message for pt to call back.

## 2017-04-23 NOTE — Telephone Encounter (Signed)
lmtcb x2 for Dominique Robbins.

## 2017-04-28 NOTE — Telephone Encounter (Signed)
lmtcb x3 for Shelly.

## 2017-04-30 NOTE — Telephone Encounter (Addendum)
We have attempted to contact Dominique Robbins several times with no success or call back from [name]. Per triage protocol, message will be closed.

## 2017-05-07 ENCOUNTER — Encounter: Payer: Self-pay | Admitting: Internal Medicine

## 2017-05-07 ENCOUNTER — Ambulatory Visit (INDEPENDENT_AMBULATORY_CARE_PROVIDER_SITE_OTHER): Payer: Medicare Other | Admitting: Internal Medicine

## 2017-05-07 VITALS — BP 130/66 | HR 87 | Ht 68.0 in | Wt 246.0 lb

## 2017-05-07 DIAGNOSIS — J383 Other diseases of vocal cords: Secondary | ICD-10-CM

## 2017-05-07 DIAGNOSIS — G4733 Obstructive sleep apnea (adult) (pediatric): Secondary | ICD-10-CM | POA: Diagnosis not present

## 2017-05-07 DIAGNOSIS — J479 Bronchiectasis, uncomplicated: Secondary | ICD-10-CM

## 2017-05-07 MED ORDER — PROMETHAZINE-CODEINE 6.25-10 MG/5ML PO SYRP: 5.0000 mL | ORAL_SOLUTION | Freq: Four times a day (QID) | ORAL | 0 refills | Status: DC | PRN

## 2017-05-07 NOTE — Progress Notes (Signed)
Patient ID: Dominique Robbins, female    DOB: 07/02/1950, 66 y.o.   MRN: [id number]  HPI 10/16/10- 66 yo never smoker followed for asthma/ bronchiectasis, lung nodules, allergic rhinitis, OSA, vocal cord paresis/ hoarseness. PFT: 07/10/2015-mild obstructive airways disease with minimal/insignificant response to bronchodilator, diffusion mildly reduced. FVC 2.98/82%, FEV1 2.32/83%, FEV1/FVC 0.78, TLC 83%, DLCO 66% NPSG 04/06/2000-AHI 33/hour, desaturation to 81% Home Sleep Test 09/19/15-AHI 37.5/hour, desaturation to 84%, body weight 273 pounds.  ---------------------------------------------------------------  11/05/16- 66 year old female never smoker for cough, allergic rhinitis, vocal cord paralysis/hoarseness/ LPR, lung nodule, OSA CPAP auto 4-11/[facility] FOLLOWS FOR: Pt states she gest increased DOE. Continues to have dry cough. Otherwise doing well. Spiriva, Dulera 200, Singulair, Atrovent/albuterol nebulizer solution, Atrovent HFA, albuterol HFA Home Sleep Test 09/19/15-AHI 37.5/hour, desaturation to 84%, body weight 273 pounds. This qualified her for replacement of old machine. CPAP remains comfortable "I like my CPAP" with no concerns. Download 87% compliance, AHI 1.3/hour.  Cough is constant, somewhat variable, with a lot of throat clearing which we know was associated with her vocal cord stents and LPR. Asthma component mild persistent but medications keep her well controlled.  05/07/17- 66 year old female never smoker for cough, allergic rhinitis, vocal cord paralysis/hoarseness/ LPR, lung nodule, OSA CPAP auto 4-11/[facility] ---osa, CPAP, uses [facility], no complaints Download 100% compliance, AHI 0.8/hour She tolerated back surgery without respiratory problems.  Weather change cause some increasing cough and nasal congestion earlier this fall but she is at baseline now and comfortable.  Always has some hoarseness and dry cough.  Review of Systems- see HPI + = positive Constitutional:   No-    weight loss, night sweats, fevers, chills, fatigue, lassitude. HEENT:   No-  headaches, difficulty swallowing, tooth/dental problems, sore throat,       No-  sneezing, itching, ear ache, nasal congestion, post nasal drip,  CV:  No-   chest pain, orthopnea, PND, swelling in lower extremities, anasarca, dizziness, palpitations Resp: +shortness of breath with exertion or at rest.                productive cough,  + non-productive cough,  No- coughing up of blood.              No-   change in color of mucus.  + wheezing.   Skin: No-   rash or lesions. GI:  No-   heartburn, indigestion, abdominal pain, nausea, vomiting,  GU MS:  Neuro-     nothing unusual Psych:  No- change in mood or affect. No depression or anxiety.  No memory loss.    Objective:   Physical Exam General- Alert, Oriented, Affect-appropriate, Distress- none acute, +obese Skin- rash-none, lesions- none, excoriation- none Lymphadenopathy- none Head- atraumatic            Eyes- Gross vision intact, PERRLA, conjunctivae clear secretions            Ears- Hearing, canals-normal            Nose- Clear, no-Septal dev, mucus, polyps, erosion, perforation.              Throat- Mallampati IV , mucosa clear , drainage- none, tonsils- atrophic, + voice quality is better than usual today,  + Throat clearing-but less              Neck- flexible , trachea midline, no stridor , thyroid nl, carotid no bruit Chest - symmetrical excursion , unlabored           Heart/CV- RRR ,  no murmur , no gallop  , no rub, nl s1 s2                           - JVD- none , edema- none, stasis changes- none, varices- none           Lung- clear to P&A, wheeze- none, cough-None , dullness-none, rub- none           Chest wall-  Abd- Br/ Gen/ Rectal- Not done, not indicated Extrem- cyanosis- none, clubbing, none, atrophy- none, strength- nl. + Rolling walker Neuro- grossly intact to observation

## 2017-05-07 NOTE — Patient Instructions (Signed)
Continue CPAP auto 4-11/ [facility], mask of choice, humidifier, supplies, AirView    Dx OSA  Script printed for cough syrup  Please call if needed

## 2017-05-15 NOTE — Assessment & Plan Note (Signed)
Her voice quality is better than usual today.  We reinforced reflux precautions.

## 2017-05-15 NOTE — Assessment & Plan Note (Signed)
She continues to benefit from CPAP and download confirms excellent compliance and control. -Leave AutoPap 4-11

## 2017-05-15 NOTE — Assessment & Plan Note (Signed)
She has some airway irritability heavy rainy season this fall but is comfortable using her routine meds now. Plan-refilled cough syrup.  Discussed medications.

## 2017-06-17 ENCOUNTER — Telehealth: Payer: Self-pay | Admitting: Internal Medicine

## 2017-06-17 NOTE — Telephone Encounter (Signed)
Called to speak with pt about what all she needed refills on and her husband stated that she is in surgery.  He will call back later today to let us know what all she needs refills on.

## 2017-11-05 ENCOUNTER — Encounter: Payer: Self-pay | Admitting: Internal Medicine

## 2017-11-05 ENCOUNTER — Ambulatory Visit (INDEPENDENT_AMBULATORY_CARE_PROVIDER_SITE_OTHER): Payer: Medicare Other | Admitting: Internal Medicine

## 2017-11-05 ENCOUNTER — Other Ambulatory Visit: Payer: Medicare Other

## 2017-11-05 ENCOUNTER — Ambulatory Visit (INDEPENDENT_AMBULATORY_CARE_PROVIDER_SITE_OTHER)
Admission: RE | Admit: 2017-11-05 | Discharge: 2017-11-05 | Disposition: A | Discharge status: Home / Self Care | Payer: Medicare Other | Source: Ambulatory Visit | Attending: Internal Medicine | Admitting: Internal Medicine

## 2017-11-05 VITALS — BP 124/78 | HR 79 | Ht 68.0 in | Wt 231.0 lb

## 2017-11-05 DIAGNOSIS — J383 Other diseases of vocal cords: Secondary | ICD-10-CM

## 2017-11-05 DIAGNOSIS — R911 Solitary pulmonary nodule: Secondary | ICD-10-CM

## 2017-11-05 DIAGNOSIS — J479 Bronchiectasis, uncomplicated: Secondary | ICD-10-CM

## 2017-11-05 DIAGNOSIS — G4733 Obstructive sleep apnea (adult) (pediatric): Secondary | ICD-10-CM

## 2017-11-05 MED ORDER — PROMETHAZINE-CODEINE 6.25-10 MG/5ML PO SYRP: 5.0000 mL | ORAL_SOLUTION | Freq: Four times a day (QID) | ORAL | 0 refills | Status: DC | PRN

## 2017-11-05 NOTE — Patient Instructions (Addendum)
We can continue CPAP auto 4-11, mask of choice, humidifier, supplies, Airview  Order CXR  Dx lung nodule 3 mm  Script printed refilling cough syrup

## 2017-11-05 NOTE — Progress Notes (Signed)
Patient ID: Dominique Robbins, female    DOB: December 07, 1950, 67 y.o.   MRN: [id number]  HPI 10/16/10- 1 yo never smoker followed for asthma/ bronchiectasis, lung nodules, allergic rhinitis, OSA, vocal cord paresis/ hoarseness. PFT: 07/10/2015-mild obstructive airways disease with minimal/insignificant response to bronchodilator, diffusion mildly reduced. FVC 2.98/82%, FEV1 2.32/83%, FEV1/FVC 0.78, TLC 83%, DLCO 66% NPSG 04/06/2000-AHI 33/hour, desaturation to 81% Home Sleep Test 09/19/15-AHI 37.5/hour, desaturation to 84%, body weight 273 pounds.  ---------------------------------------------------------------  05/07/17- 67 year old female never smoker for cough, allergic rhinitis, vocal cord paralysis/hoarseness/ LPR, lung nodule, OSA CPAP auto 4-11/[facility] ---osa, CPAP, uses [facility], no complaints Download 100% compliance, AHI 0.8/hour She tolerated back surgery without respiratory problems.  Weather change cause some increasing cough and nasal congestion earlier this fall but she is at baseline now and comfortable.  Always has some hoarseness and dry cough.  11/05/17- 67 year old female never smoker for cough, allergic rhinitis, vocal cord paralysis/hoarseness / stents/ LPR, lung nodule, OSA CPAP auto 4-11/[facility] -----OSA: DME APS Pt states she wears CPAP machine nightly and pressure works well for her. No new supplies needed at this time.   Spiriva, Atrovent/albuterol neb solution, Astelin/Flonase, Qnasl, Xopenex HFA or pro-air HFA She remains quite satisfied with her CPAP, sleeping well with no concerns.  Download not available. "Loves it" referring to her smart vest.  This continues to help her clear her airways. She denies significant events that would suggest a major aspiration.  Review of Systems- see HPI + = positive Constitutional:   No-   weight loss, night sweats, fevers, chills, fatigue, lassitude. HEENT:   No-  headaches, difficulty swallowing, tooth/dental problems, sore throat,        No-  sneezing, itching, ear ache, nasal congestion, post nasal drip,  CV:  No-   chest pain, orthopnea, PND, swelling in lower extremities, anasarca, dizziness, palpitations Resp: +shortness of breath with exertion or at rest.                productive cough,  + non-productive cough,  No- coughing up of blood.              No-   change in color of mucus.  + wheezing.   Skin: No-   rash or lesions. GI:  No-   heartburn, indigestion, abdominal pain, nausea, vomiting,  GU MS:  Neuro-     nothing unusual Psych:  No- change in mood or affect. No depression or anxiety.  No memory loss.    Objective:   Physical Exam General- Alert, Oriented, Affect-appropriate, Distress- none acute, +obese Skin- rash-none, lesions- none, excoriation- none Lymphadenopathy- none Head- atraumatic            Eyes- Gross vision intact, PERRLA, conjunctivae clear secretions            Ears- Hearing, canals-normal            Nose- Clear, no-Septal dev, mucus, polyps, erosion, perforation.              Throat- Mallampati IV , mucosa clear , drainage- none, tonsils- atrophic, + without                     change,                Neck- flexible , trachea midline, no stridor , thyroid nl, carotid no bruit Chest - symmetrical excursion , unlabored           Heart/CV- RRR , no murmur ,  no gallop  , no rub, nl s1 s2                           - JVD- none , edema- none, stasis changes- none, varices- none           Lung- clear to P&A, wheeze- none, cough-None , dullness-none, rub- none           Chest wall-  Abd- Br/ Gen/ Rectal- Not done, not indicated Extrem- cyanosis- none, clubbing, none, atrophy- none, strength- nl.  Neuro- grossly intact to observation

## 2017-11-08 NOTE — Assessment & Plan Note (Addendum)
Pneumatic Vest has definitely helped her maintain airway clearance despite her impaired cough. Plan-continue current meds, refill cough syrup, CXR

## 2017-11-08 NOTE — Assessment & Plan Note (Signed)
Benefits from CPAP, sleeping well.  She describes good compliance and control.  No supplies needed at this visit. Plan-continue auto 4-11

## 2017-11-08 NOTE — Assessment & Plan Note (Signed)
Voice quality is stable with no increase in aspiration risk. She is careful with swallowing as instructed.

## 2017-11-11 ENCOUNTER — Telehealth: Payer: Self-pay | Admitting: Internal Medicine

## 2017-11-11 NOTE — Telephone Encounter (Signed)
Spoke with Ann-she is aware that CY has been out of the office this week. Will send form back after Monday 11-15-17. Nothing more needed at this time.

## 2017-12-23 ENCOUNTER — Telehealth: Payer: Self-pay | Admitting: Internal Medicine

## 2017-12-23 NOTE — Telephone Encounter (Signed)
Called and spoke with patient regarding her cough has worsen Pt reports dry cough, more at night time, feels like she is choking, feels cough has worsen in last 3 weeks.  Pt seen CY last ov was 11/05/17, he has no available appts in new few weeks. Pt is requesting to be seen by NP tomorrow, pt is unable today Pt states that at night cough worsens and cannot breath well, she has SOB, Wheezing, and chest tightness with cough. Scheduled ov with TN 12/24/17 at 9am Nothing further needed at this time.

## 2017-12-24 ENCOUNTER — Ambulatory Visit (INDEPENDENT_AMBULATORY_CARE_PROVIDER_SITE_OTHER): Payer: Medicare Other | Admitting: Nurse Practitioner

## 2017-12-24 ENCOUNTER — Encounter: Payer: Self-pay | Admitting: Nurse Practitioner

## 2017-12-24 VITALS — BP 116/70 | HR 70 | Ht 68.0 in | Wt 261.8 lb

## 2017-12-24 DIAGNOSIS — G4733 Obstructive sleep apnea (adult) (pediatric): Secondary | ICD-10-CM

## 2017-12-24 DIAGNOSIS — J3089 Other allergic rhinitis: Secondary | ICD-10-CM | POA: Diagnosis not present

## 2017-12-24 DIAGNOSIS — J302 Other seasonal allergic rhinitis: Secondary | ICD-10-CM

## 2017-12-24 DIAGNOSIS — J45909 Unspecified asthma, uncomplicated: Secondary | ICD-10-CM

## 2017-12-24 MED ORDER — PROMETHAZINE-CODEINE 6.25-10 MG/5ML PO SYRP: 5.0000 mL | ORAL_SOLUTION | Freq: Four times a day (QID) | ORAL | 0 refills | Status: DC | PRN

## 2017-12-24 MED ORDER — METHYLPREDNISOLONE ACETATE 80 MG/ML IJ SUSP
80.0000 mg | Freq: Once | INTRAMUSCULAR | Status: AC
  Administered 2017-12-24: 80 mg via INTRAMUSCULAR

## 2017-12-24 MED ORDER — LEVALBUTEROL HCL 0.63 MG/3ML IN NEBU
0.6300 mg | INHALATION_SOLUTION | Freq: Once | RESPIRATORY_TRACT | Status: AC
  Administered 2017-12-24: 0.63 mg via RESPIRATORY_TRACT

## 2017-12-24 NOTE — Assessment & Plan Note (Signed)
Patient Instructions  Will give DepoMedrol in office today Will reorder Cough medication (phenergan with codeine) Xopenex neb in office today Continue current medications as directed Please call if symptoms do not improve

## 2017-12-24 NOTE — Assessment & Plan Note (Signed)
Continue singulair, flonase, and claritin

## 2017-12-24 NOTE — Patient Instructions (Signed)
Will give DepoMedrol in office today Will reorder Cough medication (phenergan with codeine) Xopenex neb in office today Continue current medications as directed Please call if symptoms do not improve

## 2017-12-24 NOTE — Assessment & Plan Note (Signed)
Continue CPAP at current settings 

## 2017-12-24 NOTE — Progress Notes (Signed)
 @Patient  ID: Dominique PalmsElizabeth D Robbins, female    DOB: 02/17/1951, 67 y.o.   MRN: [id number]  Chief Complaint  Patient presents with  . Follow-up    Persistant cough, worse in the evening. Having a hard time catching your breath    Referring provider: Cloward, Laban Emperoravis L, MD  10/16/10- 67 year old female: never smoker followed for asthma/ bronchiectasis, lung nodules, allergic rhinitis, OSA, vocal cord paresis/ hoarseness. Patient is followed by Dr. Maple HudsonYoung.   HPI: Patient presents today with cough and shortness of breath.   Cough  This is a recurrent problem. The current episode started 1 to 4 weeks ago. The problem has been gradually worsening. The cough is non-productive. Associated symptoms include shortness of breath and wheezing. Pertinent negatives include no chest pain, chills, fever, nasal congestion, postnasal drip or sore throat. The symptoms are aggravated by exercise, dust and fumes. Treatments tried: compliant with routine inhalers and neb treatments, singulair, claritin, phenergan with codiene. The treatment provided mild relief. Her past medical history is significant for asthma, bronchiectasis, bronchitis and environmental allergies.  Shortness of Breath  This is a recurrent problem. The current episode started 1 to 4 weeks ago. The problem has been gradually worsening. Associated symptoms include wheezing. Pertinent negatives include no chest pain, fever, leg pain, leg swelling, sore throat or sputum production. The symptoms are aggravated by exercise and fumes. Her past medical history is significant for asthma.     Recent [facility] Encounters:  Notes from last OV: 11/05/17- 67 year old female never smoker for cough, allergic rhinitis, vocal cord paralysis/hoarseness / stents/ LPR, lung nodule, OSA CPAP auto 4-11/[facility] -----OSA: DME APS Pt states she wears CPAP machine nightly and pressure works well for her. No new supplies needed at this time.   Spiriva,  Atrovent/albuterol neb solution, Astelin/Flonase, Qnasl, Xopenex HFA or pro-air HFA She remains quite satisfied with her CPAP, sleeping well with no concerns.  Download not available. "Loves it" referring to her smart vest.  This continues to help her clear her airways.   Tests:   PFT: 07/10/2015-mild obstructive airways disease with minimal/insignificant response to bronchodilator, diffusion mildly reduced. FVC 2.98/82%, FEV1 2.32/83%, FEV1/FVC 0.78, TLC 83%, DLCO 66% NPSG 04/06/2000-AHI 33/hour, desaturation to 81% Home Sleep Test 09/19/15-AHI 37.5/hour, desaturation to 84%, body weight 273 pounds.       No Known Allergies  Immunization History  Administered Date(s) Administered  . H1N1 05/15/2008  . Hepatitis A, Ped/Adol-2 Dose 03/11/2010, 07/23/2015  . Hepatitis B, ped/adol 06/19/2010  . IPV 03/25/2010  . Influenza Split 02/10/2011, 02/23/2012, 02/16/2013, 02/15/2014, 06/28/2015, 02/15/2017  . Influenza, High Dose Seasonal PF 02/27/2015, 02/15/2017  . Influenza, Seasonal, Injecte, Preservative Fre 03/05/2014  . Pneumococcal Conjugate-13 12/23/2013  . Pneumococcal Polysaccharide-23 03/30/2001, 05/08/2005, 06/27/2015  . Td 06/10/2010  . Tdap 09/28/2007, 06/10/2010, 07/23/2015  . Varicella 07/29/2006  . Zoster 11/10/2010    Past Medical History:  Diagnosis Date  . Allergic rhinitis, cause unspecified   . Arthritis   . Bronchitis, asthmatic   . Chronic bronchitis (HCC)   . COPD (chronic obstructive pulmonary disease) (HCC)    "uncertain of having this"  . Diabetes mellitus (HCC)   . GERD (gastroesophageal reflux disease)   . Headache   . HTN (hypertension)   . Hyperlipidemia   . OSA on CPAP   . Pneumonia    hx of years ago  . Vocal cord paralysis     Tobacco History: Social History   Tobacco Use  Smoking Status Never Smoker  Smokeless Tobacco Never Used   Counseling given: Never smoker   Outpatient Encounter Medications as of 12/24/2017  Medication Sig  .  albuterol (PROAIR HFA) 108 (90 Base) MCG/ACT inhaler Inhale 1-2 puffs into the lungs every 6 (six) hours as needed for wheezing or shortness of breath.  Marland Kitchen albuterol (PROVENTIL) (2.5 MG/3ML) 0.083% nebulizer solution Take 3 mLs (2.5 mg total) by nebulization every 6 (six) hours as needed for wheezing or shortness of breath.  Marland Kitchen azelastine (ASTELIN) 137 MCG/SPRAY nasal spray Place 1 spray into the nose 2 (two) times daily. Use in each nostril as directed  . Beclomethasone Dipropionate (QNASL) 80 MCG/ACT AERS Place 1 spray into the nose daily. (Patient taking differently: Place 1 spray into the nose every morning. )  . busPIRone (BUSPAR) 15 MG tablet Take 1 tablet by mouth Twice daily.  . Butalbital-APAP-Caffeine 50-300-40 MG CAPS butalbital-acetaminophen-caffeine 50 mg-300 mg-40 mg capsule  . carbamazepine (TEGRETOL) 200 MG tablet Take 200 mg by mouth 2 (two) times daily.  . Ciclesonide (ZETONNA) 37 MCG/ACT AERS 2 sprays daily.  . clotrimazole (LOTRIMIN) 1 % cream APPLY CREAM TOPICALLY TO AFFECTED AREA TWICE DAILY AS NEEDED  . CRESTOR 20 MG tablet Take 20 mg by mouth every morning.   . cyanocobalamin (,VITAMIN B-12,) 1000 MCG/ML injection Inject 1,000 mcg into the muscle every 30 (thirty) days. Mid-month  . cycloSPORINE (RESTASIS) 0.05 % ophthalmic emulsion Restasis 0.05 % eye drops in a dropperette  . diazepam (VALIUM) 5 MG tablet Take 5 mg by mouth at bedtime as needed for sedation.   . diclofenac (VOLTAREN) 75 MG EC tablet diclofenac sodium 75 mg tablet,delayed release  . dicyclomine (BENTYL) 10 MG capsule   . ergocalciferol (VITAMIN D2) 50000 units capsule once a week.  . erythromycin ophthalmic ointment erythromycin 5 mg/gram (0.5 %) eye ointment  . fluconazole (DIFLUCAN) 150 MG tablet fluconazole 150 mg tablet  . fluticasone (FLONASE) 50 MCG/ACT nasal spray Place 1-2 sprays into the nose at bedtime.  . fluticasone furoate-vilanterol (BREO ELLIPTA) 200-25 MCG/INH AEPB Breo Ellipta 200 mcg-25  mcg/dose powder for inhalation  INHALE 1 PUFF BY MOUTH DAILY  . gabapentin (NEURONTIN) 300 MG capsule Take 600 mg by mouth 2 (two) times daily.   Marland Kitchen glimepiride (AMARYL) 2 MG tablet Take 2 mg by mouth daily before breakfast.  . hydrocortisone 2.5 % cream hydrocortisone 2.5 % topical cream  . hydrOXYzine (ATARAX/VISTARIL) 25 MG tablet hydroxyzine HCl 25 mg tablet  . hyoscyamine (LEVSIN, ANASPAZ) 0.125 MG tablet Take 0.125 mg by mouth every 6 (six) hours as needed.  Marland Kitchen ipratropium (ATROVENT) 0.02 % nebulizer solution Take 2.5 mLs (0.5 mg total) by nebulization every 6 (six) hours as needed for wheezing or shortness of breath.  Marland Kitchen ipratropium (ATROVENT) 0.06 % nasal spray ipratropium bromide 42 mcg (0.06 %) nasal spray  . levalbuterol (XOPENEX HFA) 45 MCG/ACT inhaler Inhale 1-2 puffs into the lungs every 6 (six) hours as needed for wheezing or shortness of breath.  . loratadine (CLARITIN) 10 MG tablet Take 10 mg by mouth every morning.   Marland Kitchen LORazepam (ATIVAN) 1 MG tablet lorazepam 1 mg tablet  . losartan-hydrochlorothiazide (HYZAAR) 100-25 MG per tablet Take 1 tablet by mouth daily.  . meclizine (ANTIVERT) 25 MG tablet Take 25 mg by mouth 3 (three) times daily as needed for dizziness.  . meloxicam (MOBIC) 15 MG tablet meloxicam 15 mg tablet  . metFORMIN (GLUCOPHAGE) 1000 MG tablet Take 1,000 mg by mouth 2 (two) times daily.  . mometasone-formoterol (DULERA) 200-5  MCG/ACT AERO Inhale 2 puffs into the lungs 2 (two) times daily.  . montelukast (SINGULAIR) 10 MG tablet Take 1 tablet (10 mg total) by mouth daily. (Patient taking differently: Take 10 mg by mouth every morning. )  . moxifloxacin (VIGAMOX) 0.5 % ophthalmic solution Vigamox 0.5 % eye drops  . NEXIUM 40 MG capsule Take 1 capsule by mouth every morning.   . ondansetron (ZOFRAN-ODT) 4 MG disintegrating tablet ondansetron 4 mg disintegrating tablet  . oxybutynin (DITROPAN) 5 MG tablet oxybutynin chloride 5 mg tablet  . pantoprazole (PROTONIX) 40  MG tablet pantoprazole 40 mg tablet,delayed release  . polyvinyl alcohol (LIQUIFILM TEARS) 1.4 % ophthalmic solution Place 1 drop into both eyes 2 (two) times daily as needed for dry eyes.  . Potassium Chloride ER 20 MEQ TBCR potassium chloride ER 20 mEq tablet,extended release  . PRESCRIPTION MEDICATION Inject 1 each into the skin every 14 (fourteen) days. Allergy shot.  . promethazine-codeine (PHENERGAN WITH CODEINE) 6.25-10 MG/5ML syrup Take 5 mLs by mouth every 6 (six) hours as needed for cough.  Marland Kitchen Respiratory Therapy Supplies (FLUTTER) DEVI Blow through 4 times, repeat three times daily  . sertraline (ZOLOFT) 100 MG tablet Take 200 mg by mouth every morning.   . tamsulosin (FLOMAX) 0.4 MG CAPS capsule tamsulosin 0.4 mg capsule  . tiotropium (SPIRIVA) 18 MCG inhalation capsule Place 1 capsule (18 mcg total) into inhaler and inhale daily.  Marland Kitchen tiZANidine (ZANAFLEX) 4 MG tablet Take 1 tablet (4 mg total) by mouth every 6 (six) hours as needed for muscle spasms.  . Vitamin D, Ergocalciferol, (DRISDOL) 50000 UNITS CAPS Take 1 capsule by mouth once a week. Monday  . [DISCONTINUED] promethazine-codeine (PHENERGAN WITH CODEINE) 6.25-10 MG/5ML syrup Take 5 mLs by mouth every 6 (six) hours as needed for cough.  . [DISCONTINUED] hyoscyamine (LEVSIN, ANASPAZ) 0.125 MG tablet hyoscyamine 0.125 mg sublingual tablet  . [DISCONTINUED] ipratropium (ATROVENT HFA) 17 MCG/ACT inhaler Inhale 2 puffs into the lungs 4 (four) times daily. As needed for wheeze, cough, chest tightness  . [DISCONTINUED] pneumococcal 13-valent conjugate vaccine (PREVNAR 13) SUSP injection Prevnar 13 (PF) 0.5 mL intramuscular syringe  inject 0.5 milliliter intramuscularly  . [DISCONTINUED] pneumococcal 23 valent vaccine (PNEUMOVAX 23) 25 MCG/0.5ML injection Pneumovax 23 25 mcg/0.5 mL injection syringe  inject 0.5 milliliter intramuscularly  . [EXPIRED] levalbuterol (XOPENEX) nebulizer solution 0.63 mg   . [EXPIRED] methylPREDNISolone  acetate (DEPO-MEDROL) injection 80 mg    No facility-administered encounter medications on file as of 12/24/2017.      Review of Systems  Review of Systems  Constitutional: Negative for chills and fever.  HENT: Negative for nosebleeds, postnasal drip, sinus pain and sore throat.   Respiratory: Positive for cough, shortness of breath and wheezing. Negative for sputum production.   Cardiovascular: Negative for chest pain and leg swelling.  Allergic/Immunologic: Positive for environmental allergies.  Neurological: Negative.   Psychiatric/Behavioral: Negative.        Physical Exam  BP 116/70 (BP Location: Left Arm, Patient Position: Sitting, Cuff Size: Large)   Pulse 70   Ht 5\' 8"  (1.727 m)   Wt 261 lb 12.8 oz (118.8 kg)   SpO2 97%   BMI 39.81 kg/m   Wt Readings from Last 5 Encounters:  12/24/17 261 lb 12.8 oz (118.8 kg)  11/05/17 231 lb (104.8 kg)  05/07/17 246 lb (111.6 kg)  11/05/16 239 lb 3.2 oz (108.5 kg)  05/07/16 291 lb 12.8 oz (132.4 kg)     Physical Exam  Constitutional: She  is oriented to person, place, and time. She appears well-developed and well-nourished. No distress.  Cardiovascular: Normal rate and regular rhythm.  Pulmonary/Chest: Effort normal. She has wheezes (slight wheezing - improved after neb in office).  Neurological: She is alert and oriented to person, place, and time.  Psychiatric: She has a normal mood and affect.  Nursing note and vitals reviewed.   Imaging:  Chest xray: 11-05-17 - IMPRESSION: No acute abnormality noted. Previously seen pulmonary nodules are not well appreciated due to their size.  Assessment & Plan:   Asthma with bronchitis Patient Instructions  Will give DepoMedrol in office today Will reorder Cough medication (phenergan with codeine) Xopenex neb in office today Continue current medications as directed Please call if symptoms do not improve      Seasonal and perennial allergic rhinitis Continue singulair,  flonase, and claritin  Obstructive sleep apnea Continue CPAP at current settings     Ivonne Andrew, NP 12/24/2017

## 2018-01-06 ENCOUNTER — Telehealth: Payer: Self-pay | Admitting: Internal Medicine

## 2018-01-06 DIAGNOSIS — J45909 Unspecified asthma, uncomplicated: Secondary | ICD-10-CM

## 2018-01-06 MED ORDER — PROMETHAZINE-CODEINE 6.25-10 MG/5ML PO SYRP: 5.0000 mL | ORAL_SOLUTION | Freq: Four times a day (QID) | ORAL | 0 refills | Status: DC | PRN

## 2018-01-06 NOTE — Telephone Encounter (Signed)
Ok to refill this? 

## 2018-01-06 NOTE — Telephone Encounter (Signed)
I will defer this decision to Dr Maple HudsonYoung when he is available.

## 2018-01-06 NOTE — Telephone Encounter (Signed)
Dr. Young, please advise. Thank you!

## 2018-01-06 NOTE — Telephone Encounter (Signed)
Pt is aware of below message and voiced her understanding.  Rx for Phenergan with Codeine [facility]  Nothing further is needed.

## 2018-01-06 NOTE — Telephone Encounter (Signed)
Pt is requesting refill on Phenergan with Codeine. Last refilled on 12/24/17 #[id number]. Pt states she has not had any improvement in cough. Cough is non prod and worsens in the evening. Next ov 05/09/18.  RB please advise, as CY is unavailable. Thanks  No Known Allergies

## 2018-02-11 ENCOUNTER — Telehealth: Payer: Self-pay | Admitting: Internal Medicine

## 2018-02-11 DIAGNOSIS — J45909 Unspecified asthma, uncomplicated: Secondary | ICD-10-CM

## 2018-02-11 MED ORDER — PROMETHAZINE-CODEINE 6.25-10 MG/5ML PO SYRP: 5.0000 mL | ORAL_SOLUTION | Freq: Four times a day (QID) | ORAL | 0 refills | Status: DC | PRN

## 2018-02-11 NOTE — Telephone Encounter (Signed)
Ok to refill 

## 2018-02-11 NOTE — Telephone Encounter (Signed)
Spoke with pt. She is requesting a refill on Promethazine-Codeine cough syrup. Her last OV with CY was on 11/05/17. She has a pending OV with CY on 05/09/18. Last refill was on 01/06/18 for #27mL.  CY - please advise on refill. Thanks.

## 2018-02-11 NOTE — Telephone Encounter (Signed)
Spoke with pt and advised her that Rx was sent into her pharmacy. [facility] on [address]. Nothing further is needed.

## 2018-03-10 ENCOUNTER — Telehealth: Payer: Self-pay | Admitting: Internal Medicine

## 2018-03-10 DIAGNOSIS — J45909 Unspecified asthma, uncomplicated: Secondary | ICD-10-CM

## 2018-03-10 MED ORDER — PROMETHAZINE-CODEINE 6.25-10 MG/5ML PO SYRP: 5.0000 mL | ORAL_SOLUTION | Freq: Four times a day (QID) | ORAL | 0 refills | Status: DC | PRN

## 2018-03-10 NOTE — Telephone Encounter (Addendum)
Rx has been printed for CY to sign. Once it is signed, we will place it up front for pt.  Called pt to let her know this had been done.pt expressed understanding and stated she would be by office tomorrow, 10/25 to pick Rx up.  Dr. Maple Hudson, please let us know once Rx has been signed so we can place it up front for pt. Thanks!

## 2018-03-10 NOTE — Telephone Encounter (Signed)
Patient returning phone call.  Phone number is [phone].

## 2018-03-10 NOTE — Telephone Encounter (Signed)
Pt requesting refill of promethazine-codeine cough syrup.  Pt last seen at office by Angus Seller, NP 12/24/17 and med last refilled for pt 02/11/18, #274ml with 0 RF.  Dr. Maple Hudson, please advise if it is okay to refill med for pt.  No Known Allergies   Current Outpatient Medications:  .  albuterol (PROAIR HFA) 108 (90 Base) MCG/ACT inhaler, Inhale 1-2 puffs into the lungs every 6 (six) hours as needed for wheezing or shortness of breath., Disp: 1 Inhaler, Rfl: 5 .  albuterol (PROVENTIL) (2.5 MG/3ML) 0.083% nebulizer solution, Take 3 mLs (2.5 mg total) by nebulization every 6 (six) hours as needed for wheezing or shortness of breath., Disp: 75 mL, Rfl: prn .  azelastine (ASTELIN) 137 MCG/SPRAY nasal spray, Place 1 spray into the nose 2 (two) times daily. Use in each nostril as directed, Disp: , Rfl:  .  Beclomethasone Dipropionate (QNASL) 80 MCG/ACT AERS, Place 1 spray into the nose daily. (Patient taking differently: Place 1 spray into the nose every morning. ), Disp: 1 Inhaler, Rfl: prn .  busPIRone (BUSPAR) 15 MG tablet, Take 1 tablet by mouth Twice daily., Disp: , Rfl:  .  Butalbital-APAP-Caffeine 50-300-40 MG CAPS, butalbital-acetaminophen-caffeine 50 mg-300 mg-40 mg capsule, Disp: , Rfl:  .  carbamazepine (TEGRETOL) 200 MG tablet, Take 200 mg by mouth 2 (two) times daily., Disp: , Rfl:  .  Ciclesonide (ZETONNA) 37 MCG/ACT AERS, 2 sprays daily., Disp: , Rfl:  .  clotrimazole (LOTRIMIN) 1 % cream, APPLY CREAM TOPICALLY TO AFFECTED AREA TWICE DAILY AS NEEDED, Disp: , Rfl: 1 .  CRESTOR 20 MG tablet, Take 20 mg by mouth every morning. , Disp: , Rfl:  .  cyanocobalamin (,VITAMIN B-12,) 1000 MCG/ML injection, Inject 1,000 mcg into the muscle every 30 (thirty) days. Mid-month, Disp: , Rfl:  .  cycloSPORINE (RESTASIS) 0.05 % ophthalmic emulsion, Restasis 0.05 % eye drops in a dropperette, Disp: , Rfl:  .  diazepam (VALIUM) 5 MG tablet, Take 5 mg by mouth at bedtime as needed for sedation. , Disp: ,  Rfl:  .  diclofenac (VOLTAREN) 75 MG EC tablet, diclofenac sodium 75 mg tablet,delayed release, Disp: , Rfl:  .  dicyclomine (BENTYL) 10 MG capsule, , Disp: , Rfl:  .  ergocalciferol (VITAMIN D2) 50000 units capsule, once a week., Disp: , Rfl:  .  erythromycin ophthalmic ointment, erythromycin 5 mg/gram (0.5 %) eye ointment, Disp: , Rfl:  .  fluconazole (DIFLUCAN) 150 MG tablet, fluconazole 150 mg tablet, Disp: , Rfl:  .  fluticasone (FLONASE) 50 MCG/ACT nasal spray, Place 1-2 sprays into the nose at bedtime., Disp: 16 g, Rfl: 6 .  fluticasone furoate-vilanterol (BREO ELLIPTA) 200-25 MCG/INH AEPB, Breo Ellipta 200 mcg-25 mcg/dose powder for inhalation  INHALE 1 PUFF BY MOUTH DAILY, Disp: , Rfl:  .  gabapentin (NEURONTIN) 300 MG capsule, Take 600 mg by mouth 2 (two) times daily. , Disp: , Rfl:  .  glimepiride (AMARYL) 2 MG tablet, Take 2 mg by mouth daily before breakfast., Disp: , Rfl:  .  hydrocortisone 2.5 % cream, hydrocortisone 2.5 % topical cream, Disp: , Rfl:  .  hydrOXYzine (ATARAX/VISTARIL) 25 MG tablet, hydroxyzine HCl 25 mg tablet, Disp: , Rfl:  .  hyoscyamine (LEVSIN, ANASPAZ) 0.125 MG tablet, Take 0.125 mg by mouth every 6 (six) hours as needed., Disp: , Rfl:  .  ipratropium (ATROVENT) 0.02 % nebulizer solution, Take 2.5 mLs (0.5 mg total) by nebulization every 6 (six) hours as needed for wheezing or shortness of  breath., Disp: 75 mL, Rfl: 12 .  ipratropium (ATROVENT) 0.06 % nasal spray, ipratropium bromide 42 mcg (0.06 %) nasal spray, Disp: , Rfl:  .  levalbuterol (XOPENEX HFA) 45 MCG/ACT inhaler, Inhale 1-2 puffs into the lungs every 6 (six) hours as needed for wheezing or shortness of breath., Disp: 1 Inhaler, Rfl: prn .  loratadine (CLARITIN) 10 MG tablet, Take 10 mg by mouth every morning. , Disp: , Rfl:  .  LORazepam (ATIVAN) 1 MG tablet, lorazepam 1 mg tablet, Disp: , Rfl:  .  losartan-hydrochlorothiazide (HYZAAR) 100-25 MG per tablet, Take 1 tablet by mouth daily., Disp: , Rfl:   .  meclizine (ANTIVERT) 25 MG tablet, Take 25 mg by mouth 3 (three) times daily as needed for dizziness., Disp: , Rfl:  .  meloxicam (MOBIC) 15 MG tablet, meloxicam 15 mg tablet, Disp: , Rfl:  .  metFORMIN (GLUCOPHAGE) 1000 MG tablet, Take 1,000 mg by mouth 2 (two) times daily., Disp: , Rfl:  .  mometasone-formoterol (DULERA) 200-5 MCG/ACT AERO, Inhale 2 puffs into the lungs 2 (two) times daily., Disp: 1 Inhaler, Rfl: prn .  montelukast (SINGULAIR) 10 MG tablet, Take 1 tablet (10 mg total) by mouth daily. (Patient taking differently: Take 10 mg by mouth every morning. ), Disp: 90 tablet, Rfl: 3 .  moxifloxacin (VIGAMOX) 0.5 % ophthalmic solution, Vigamox 0.5 % eye drops, Disp: , Rfl:  .  NEXIUM 40 MG capsule, Take 1 capsule by mouth every morning. , Disp: , Rfl:  .  ondansetron (ZOFRAN-ODT) 4 MG disintegrating tablet, ondansetron 4 mg disintegrating tablet, Disp: , Rfl:  .  oxybutynin (DITROPAN) 5 MG tablet, oxybutynin chloride 5 mg tablet, Disp: , Rfl:  .  pantoprazole (PROTONIX) 40 MG tablet, pantoprazole 40 mg tablet,delayed release, Disp: , Rfl:  .  polyvinyl alcohol (LIQUIFILM TEARS) 1.4 % ophthalmic solution, Place 1 drop into both eyes 2 (two) times daily as needed for dry eyes., Disp: , Rfl:  .  Potassium Chloride ER 20 MEQ TBCR, potassium chloride ER 20 mEq tablet,extended release, Disp: , Rfl:  .  PRESCRIPTION MEDICATION, Inject 1 each into the skin every 14 (fourteen) days. Allergy shot., Disp: , Rfl:  .  promethazine-codeine (PHENERGAN WITH CODEINE) 6.25-10 MG/5ML syrup, Take 5 mLs by mouth every 6 (six) hours as needed for cough., Disp: 240 mL, Rfl: 0 .  Respiratory Therapy Supplies (FLUTTER) DEVI, Blow through 4 times, repeat three times daily, Disp: 1 each, Rfl: 0 .  sertraline (ZOLOFT) 100 MG tablet, Take 200 mg by mouth every morning. , Disp: , Rfl:  .  tamsulosin (FLOMAX) 0.4 MG CAPS capsule, tamsulosin 0.4 mg capsule, Disp: , Rfl:  .  tiotropium (SPIRIVA) 18 MCG inhalation  capsule, Place 1 capsule (18 mcg total) into inhaler and inhale daily., Disp: 30 capsule, Rfl: 2 .  tiZANidine (ZANAFLEX) 4 MG tablet, Take 1 tablet (4 mg total) by mouth every 6 (six) hours as needed for muscle spasms., Disp: 40 tablet, Rfl: 0 .  Vitamin D, Ergocalciferol, (DRISDOL) 50000 UNITS CAPS, Take 1 capsule by mouth once a week. Monday, Disp: , Rfl:

## 2018-03-10 NOTE — Telephone Encounter (Signed)
Attempted to contact pt. I did not receive an answer. I have left a message for pt to return our call.  

## 2018-03-10 NOTE — Telephone Encounter (Signed)
Ok to refill 

## 2018-03-11 NOTE — Telephone Encounter (Signed)
Rx was signed by CY and brought back to triage. We will place it up front for pt.nothing further needed.

## 2018-04-09 ENCOUNTER — Other Ambulatory Visit: Payer: Self-pay | Admitting: Emergency Medicine

## 2018-04-09 MED ORDER — ALBUTEROL SULFATE (2.5 MG/3ML) 0.083% IN NEBU: 2.5000 mg | INHALATION_SOLUTION | RESPIRATORY_TRACT | 0 refills | Status: DC | PRN

## 2018-04-09 MED ORDER — ALBUTEROL SULFATE HFA 108 (90 BASE) MCG/ACT IN AERS: 1.0000 | INHALATION_SPRAY | RESPIRATORY_TRACT | 0 refills | Status: DC | PRN

## 2018-04-09 NOTE — Telephone Encounter (Signed)
Rx refills sent to pharmacy. 

## 2018-04-09 NOTE — Telephone Encounter (Signed)
Pt called to state that she has run oiut of all of her nebs. She uses prn, has not needed for several =weeks. I see albuterol and atrovent nebs on her med list. She tells me that she does not have her ProAir HFA either.   No distress, but does believe that she need her rescue medication, a bit m ore symptomatic through the day today.  Will order albuterol nebs, also an HFA now  Levy Pupaobert Tashunda Vandezande, MD, PhD 04/09/2018, 5:56 PM [facility] [phone] or if no answer [phone]

## 2018-04-09 NOTE — Addendum Note (Signed)
Addended by: Satira SarkBYRUM, Dearl Rudden W on: 04/09/2018 06:12 PM   Modules accepted: Orders

## 2018-05-09 ENCOUNTER — Ambulatory Visit (INDEPENDENT_AMBULATORY_CARE_PROVIDER_SITE_OTHER): Payer: Medicare Other | Admitting: Internal Medicine

## 2018-05-09 ENCOUNTER — Encounter: Payer: Self-pay | Admitting: Internal Medicine

## 2018-05-09 VITALS — BP 120/78 | HR 76 | Ht 68.0 in | Wt 254.6 lb

## 2018-05-09 DIAGNOSIS — J383 Other diseases of vocal cords: Secondary | ICD-10-CM

## 2018-05-09 DIAGNOSIS — G4733 Obstructive sleep apnea (adult) (pediatric): Secondary | ICD-10-CM | POA: Diagnosis not present

## 2018-05-09 DIAGNOSIS — J45909 Unspecified asthma, uncomplicated: Secondary | ICD-10-CM | POA: Diagnosis not present

## 2018-05-09 DIAGNOSIS — J479 Bronchiectasis, uncomplicated: Secondary | ICD-10-CM

## 2018-05-09 MED ORDER — PROMETHAZINE-CODEINE 6.25-10 MG/5ML PO SYRP: 5.0000 mL | ORAL_SOLUTION | Freq: Four times a day (QID) | ORAL | 0 refills | Status: DC | PRN

## 2018-05-09 NOTE — Progress Notes (Signed)
Patient ID: Dominique Robbins, female    DOB: 01/07/1951, 67 y.o.   MRN: [id number]  HPI 10/16/10- 67 yo never smoker followed for asthma/ bronchiectasis, lung nodules, allergic rhinitis, OSA, vocal cord paresis/ hoarseness. PFT: 07/10/2015-mild obstructive airways disease with minimal/insignificant response to bronchodilator, diffusion mildly reduced. FVC 2.98/82%, FEV1 2.32/83%, FEV1/FVC 0.78, TLC 83%, DLCO 66% NPSG 04/06/2000-AHI 33/hour, desaturation to 81% Home Sleep Test 09/19/15-AHI 37.5/hour, desaturation to 84%, body weight 273 pounds.  ---------------------------------------------------------------  11/05/17- 67 year old female never smoker for cough, allergic rhinitis, vocal cord paralysis/hoarseness / stents/ LPR, lung nodule, OSA CPAP auto 4-11/[employer] -----OSA: DME APS Pt states she wears CPAP machine nightly and pressure works well for her. No new supplies needed at this time.   Spiriva, Atrovent/albuterol neb solution, Astelin/Flonase, Qnasl, Xopenex HFA or pro-air HFA She remains quite satisfied with her CPAP, sleeping well with no concerns.  Download not available. "Loves it" referring to her smart vest.  This continues to help her clear her airways. She denies significant events that would suggest a major aspiration.  05/09/18- 67 year old female never smoker for cough, allergic rhinitis, vocal cord paralysis/hoarseness / stents/ LPR, lung nodule, OSA CPAP auto set 11/[employer] Smart Vest, pro-air HFA, Breo 200/ Dulera 200, Spiriva HandiHaler, neb albuterol/ ipratropium, Qnasl, -----OSA: DME: APS. Pt states she is wearing her CPAP machine but is having trouble with machine spitting water back at her. DL attached.  Download 97% compliance AHI 2.0/hour She thinks her machine may need some servicing but is working okay. Has both Dulera and [name]; Dulera is cheaper.  Her pneumatic vest definitely helps keep her chest clear but she struggles with chronic cough related to her vocal cord  stents and chronic low-grade LPR. CXR 11/05/2017- IMPRESSION: No acute abnormality noted. Previously seen pulmonary nodules are not well appreciated due to their size.  Review of Systems- see HPI + = positive Constitutional:   No-   weight loss, night sweats, fevers, chills, fatigue, lassitude. HEENT:   No-  headaches, difficulty swallowing, tooth/dental problems, sore throat,       No-  sneezing, itching, ear ache, nasal congestion, post nasal drip,  CV:  No-   chest pain, orthopnea, PND, swelling in lower extremities, anasarca, dizziness, palpitations Resp: +shortness of breath with exertion or at rest.                productive cough,  + non-productive cough,  No- coughing up of blood.              No-   change in color of mucus.  + wheezing.   Skin: No-   rash or lesions. GI:  No-   heartburn, indigestion, abdominal pain, nausea, vomiting,  GU MS:  Neuro-     nothing unusual Psych:  No- change in mood or affect. No depression or anxiety.  No memory loss.    Objective:   Physical Exam General- Alert, Oriented, Affect-appropriate, Distress- none acute, +obese Skin- rash-none, lesions- none, excoriation- none Lymphadenopathy- none Head- atraumatic            Eyes- Gross vision intact, PERRLA, conjunctivae clear secretions            Ears- Hearing, canals-normal            Nose- Clear, no-Septal dev, mucus, polyps, erosion, perforation.              Throat- Mallampati IV , mucosa clear , drainage- none, tonsils- atrophic, + remains hoarse, without stridor  Neck- flexible , trachea midline, no stridor , thyroid nl, carotid no bruit Chest - symmetrical excursion , unlabored           Heart/CV- RRR , no murmur , no gallop  , no rub, nl s1 s2                           - JVD- none , edema- none, stasis changes- none, varices- none           Lung- clear to P&A, wheeze- none, cough-None , dullness-none, rub- none           Chest wall-  Abd- Br/ Gen/ Rectal- Not done, not  indicated Extrem- cyanosis- none, clubbing, none, atrophy- none, strength- nl.  Neuro- grossly intact to observation

## 2018-05-09 NOTE — Patient Instructions (Signed)
Cough syrup refilled  You don't need both Breo and Dulera, that is duplication. [facility], you can use up the remaining Breo (1 puff daily then rinse mouth), and then stick with Dulera (2 puffs then rinse mouth, twice daily.  We can continue CPAP auto 4-11, mask of choice, humidifier, supplies, AirView We will try to help you clarify what is needed to service your machine. Please cal if we can help

## 2018-05-18 NOTE — Assessment & Plan Note (Signed)
Good compliance and control.  She continues to benefit. Plan-continue CPAP 11 (AutoSet machine)

## 2018-05-18 NOTE — Assessment & Plan Note (Signed)
She is followed at the vocal clinic at [facility]

## 2018-05-18 NOTE — Assessment & Plan Note (Signed)
Chest is clear on exam.  She feels her pneumatic fast definitely helps keep her airways clear of retained secretions.

## 2018-06-30 ENCOUNTER — Telehealth: Payer: Self-pay | Admitting: Internal Medicine

## 2018-06-30 NOTE — Telephone Encounter (Signed)
Attempted to call patient today regarding promethazine-codeine is active or not . I did not receive an answer at time of call. I have left a voicemail message for pt to return call. X1

## 2018-07-01 NOTE — Telephone Encounter (Signed)
Patient calling to check on cough syrup, CB is [phone]

## 2018-07-01 NOTE — Telephone Encounter (Signed)
Spoke with pt, she states we have to call and verify Rx for Promethazine cough syrup. I called Courtney at [employer] and advised her that the Rx was written by CY on 05/09/2018. Pt was made aware and nothing further is needed.

## 2018-08-03 ENCOUNTER — Telehealth: Payer: Self-pay | Admitting: Internal Medicine

## 2018-08-03 NOTE — Telephone Encounter (Signed)
Left detailed message for Rolly Salter to refax document to triage fax number.

## 2018-08-04 NOTE — Telephone Encounter (Signed)
ATC Haley, [facility].  LMTCB.

## 2018-08-04 NOTE — Telephone Encounter (Signed)
LMTCB for [employer]

## 2018-08-04 NOTE — Telephone Encounter (Signed)
Rolly Salter is returning phone call.  Haley phone number is [phone].

## 2018-08-05 NOTE — Telephone Encounter (Signed)
LVM for [facility] with [facility] at phone number [phone] advised her no fax has been rec'd at this time. We will need the fax to be resent to fax [phone]. X1

## 2018-08-08 NOTE — Telephone Encounter (Signed)
Called and spoke with [facility] with [facility] today regarding fax The fax is for pt to renew cpap supplies; she had wrong fax number Provided [facility] with triage fax [phone] today Awaiting fax to give to [facility] for review to completed and sign

## 2018-08-08 NOTE — Telephone Encounter (Signed)
LVM for [facility] with [facility] at [phone]. X2 Need to find out exactly with Rolly Salter is in need of at this time, and  has fax been rec'd and already completed.

## 2018-08-08 NOTE — Telephone Encounter (Signed)
rec'd fax from [place] with [facility] this morning CY has signed the faxed for cpap supplies Faxed this back to Attn Murphy at [phone] Confirmation went through, placed in CY scan Nothing further needed.

## 2018-09-27 ENCOUNTER — Telehealth: Payer: Self-pay | Admitting: Internal Medicine

## 2018-09-27 NOTE — Telephone Encounter (Signed)
Called and spoke with patient regarding dry cough Pt states she would like cough medicine for dry cough Pt reports some wheezing and SOB; not seen since 04/2018 Scheduled televisit with CY for Monday 10/03/18 at 430pm Pt refused ov with PA Nothing further needed.

## 2018-10-01 ENCOUNTER — Encounter: Payer: Self-pay | Admitting: Internal Medicine

## 2018-10-03 ENCOUNTER — Ambulatory Visit (INDEPENDENT_AMBULATORY_CARE_PROVIDER_SITE_OTHER): Payer: Medicare Other | Admitting: Internal Medicine

## 2018-10-03 ENCOUNTER — Encounter: Payer: Self-pay | Admitting: Internal Medicine

## 2018-10-03 ENCOUNTER — Other Ambulatory Visit: Payer: Self-pay

## 2018-10-03 DIAGNOSIS — G4733 Obstructive sleep apnea (adult) (pediatric): Secondary | ICD-10-CM

## 2018-10-03 DIAGNOSIS — J45909 Unspecified asthma, uncomplicated: Secondary | ICD-10-CM

## 2018-10-03 DIAGNOSIS — J479 Bronchiectasis, uncomplicated: Secondary | ICD-10-CM | POA: Diagnosis not present

## 2018-10-03 MED ORDER — PROMETHAZINE-CODEINE 6.25-10 MG/5ML PO SYRP: 5.0000 mL | ORAL_SOLUTION | Freq: Four times a day (QID) | ORAL | 0 refills | Status: DC | PRN

## 2018-10-03 NOTE — Patient Instructions (Signed)
We can continue CPAP 11, mask of choice humidifier, supplies, AirView/ card  Refill script sent for codeine cough syrup  Please call as needed

## 2018-10-03 NOTE — Progress Notes (Signed)
Patient ID: Dominique Robbins, female    DOB: [birth year], 68 y.o.   MRN: [id number]  HPI 10/16/10- 68 yo never smoker followed for asthma/ bronchiectasis, lung nodules, allergic rhinitis, OSA, vocal cord paresis/ hoarseness. PFT: 07/10/2015-mild obstructive airways disease with minimal/insignificant response to bronchodilator, diffusion mildly reduced. FVC 2.98/82%, FEV1 2.32/83%, FEV1/FVC 0.78, TLC 83%, DLCO 66% NPSG 04/06/2000-AHI 33/hour, desaturation to 81% Home Sleep Test 09/19/15-AHI 37.5/hour, desaturation to 84%, body weight 273 pounds.  ---------------------------------------------------------------   05/09/18- 68 year old female never smoker for cough, allergic rhinitis, vocal cord paralysis/hoarseness / stents/ LPR, lung nodule, OSA CPAP auto set 11/[employer] Smart Vest, pro-air HFA, Breo 200/ Dulera 200, Spiriva HandiHaler, neb albuterol/ ipratropium, Qnasl, -----OSA: DME: APS. Pt states she is wearing her CPAP machine but is having trouble with machine spitting water back at her. DL attached.  Download 97% compliance AHI 2.0/hour She thinks her machine may need some servicing but is working okay. Has both Dulera and Lavada Mesi is cheaper.  Her pneumatic vest definitely helps keep her chest clear but she struggles with chronic cough related to her vocal cord stents and chronic low-grade LPR. CXR 11/05/2017- IMPRESSION: No acute abnormality noted. Previously seen pulmonary nodules are not well appreciated due to their size.  Review of Systems- see HPI + = positive Constitutional:   No-   weight loss, night sweats, fevers, chills, fatigue, lassitude. HEENT:   No-  headaches, difficulty swallowing, tooth/dental problems, sore throat,       No-  sneezing, itching, ear ache, nasal congestion, post nasal drip,  CV:  No-   chest pain, orthopnea, PND, swelling in lower extremities, anasarca, dizziness, palpitations Resp: +shortness of breath with exertion or at rest.    productive cough,  + non-productive cough,  No- coughing up of blood.              No-   change in color of mucus.  + wheezing.   Skin: No-   rash or lesions. GI:  No-   heartburn, indigestion, abdominal pain, nausea, vomiting,  GU MS:  Neuro-     nothing unusual Psych:  No- change in mood or affect. No depression or anxiety.  No memory loss.    Objective:   Physical Exam General- Alert, Oriented, Affect-appropriate, Distress- none acute, +obese Skin- rash-none, lesions- none, excoriation- none Lymphadenopathy- none Head- atraumatic            Eyes- Gross vision intact, PERRLA, conjunctivae clear secretions            Ears- Hearing, canals-normal            Nose- Clear, no-Septal dev, mucus, polyps, erosion, perforation.              Throat- Mallampati IV , mucosa clear , drainage- none, tonsils- atrophic, + remains hoarse, without stridor              Neck- flexible , trachea midline, no stridor , thyroid nl, carotid no bruit Chest - symmetrical excursion , unlabored           Heart/CV- RRR , no murmur , no gallop  , no rub, nl s1 s2                           - JVD- none , edema- none, stasis changes- none, varices- none           Lung- clear to P&A, wheeze- none, cough-None , dullness-none, rub- none  Chest wall-  Abd- Br/ Gen/ Rectal- Not done, not indicated Extrem- cyanosis- none, clubbing, none, atrophy- none, strength- nl.  Neuro- grossly intact to observation  10/03/2018- Virtual Visit via Telephone Note  I connected with Dominique PalmsElizabeth D Roseman on 10/03/18 at  4:30 PM EDT by telephone and verified that I am speaking with the correct person using two identifiers.  Location: Patient: H Provider:O   I discussed the limitations, risks, security and privacy concerns of performing an evaluation and management service by telephone and the availability of in person appointments. I also discussed with the patient that there may be a patient responsible charge related to this  service. The patient expressed understanding and agreed to proceed.   History of Present Illness: 10/03/2018- 68 year old female never smoker for cough, allergic rhinitis, vocal cord paralysis/hoarseness / stents/ LPR, lung nodules, OSA CPAP auto set 11/[employer] Smart Vest, pro-air HFA, Breo 200/ Dulera 200, Spiriva HandiHaler, neb albuterol/ ipratropium, Qnasl,  Doing fine with CPAP used every night. No concerns Breathing otherwise unchanged with stable persistent wheeze and cough. No obvious aspiration events recognized. Asks refill cough syrup. Observations/Objective: Download- not available today  CXR 11/05/17 IMPRESSION: No acute abnormality noted. Previously seen pulmonary nodules are not well appreciated due to their size.   Assessment and Plan: OSA good compliance and control- plan- continue CPAP 11 Chronic bronchiectasis- chronic microaspiration due to vocal cord paralysis/ stent Follow Up Instructions: 6 months   I discussed the assessment and treatment plan with the patient. The patient was provided an opportunity to ask questions and all were answered. The patient agreed with the plan and demonstrated an understanding of the instructions.   The patient was advised to call back or seek an in-person evaluation if the symptoms worsen or if the condition fails to improve as anticipated.  I provided 18 minutes of non-face-to-face time during this encounter.   Jetty Duhamellinton Young, MD

## 2018-10-06 ENCOUNTER — Ambulatory Visit (INDEPENDENT_AMBULATORY_CARE_PROVIDER_SITE_OTHER): Payer: Medicare Other

## 2018-10-06 ENCOUNTER — Other Ambulatory Visit: Payer: Self-pay | Admitting: Internal Medicine

## 2018-10-06 DIAGNOSIS — J42 Unspecified chronic bronchitis: Secondary | ICD-10-CM

## 2018-10-06 NOTE — Progress Notes (Signed)
Pt came into the office today 10/06/2018 requesting a CXR. Pt was last seen for televisit on 10/03/2018 w/ CY. There was no mention of CXR in AVS notes and no CXR was ordered at the time of the visit.   I spoke w/ CY and he stated it would be ok for the pt to get a CXR today for dx of chronic bronchitis.   Order for CXR has been placed. Heather with Xray has been made aware of order. Nothing further needed at this time.

## 2018-11-20 NOTE — Assessment & Plan Note (Signed)
Unsuccessful at losing weight

## 2018-11-20 NOTE — Assessment & Plan Note (Signed)
Continues to benefit from CPAP. Has autoset machine, but on fixed pressure 11 which seems to work well for her.

## 2018-11-20 NOTE — Assessment & Plan Note (Signed)
Chronic microaspiration due to cord dysfunction/ splints Plan- continue aspiration precauations., Refill cough syrup

## 2018-11-24 ENCOUNTER — Telehealth: Payer: Self-pay | Admitting: Internal Medicine

## 2018-11-24 NOTE — Telephone Encounter (Signed)
Pt's recent OV notes have been faxed in Devan's attn to the provided fax number. Nothing further needed.

## 2018-12-07 ENCOUNTER — Telehealth: Payer: Self-pay | Admitting: Internal Medicine

## 2018-12-07 NOTE — Telephone Encounter (Signed)
Dominique Robbins, [facility] LM requesting OV notes 06/2016 and 03/2018. Called [facility] to clarify OV note dates needed. LMTCB on Dominique Robbins's VM.

## 2018-12-08 NOTE — Telephone Encounter (Signed)
I spoke with Dominique Robbins and he needed the office notes between 06/2016 through todays date for any notes on OSA and that she is using a CPAP. I faxed him the notes to [phone] and nothing further is needed.

## 2019-02-16 ENCOUNTER — Telehealth: Payer: Self-pay | Admitting: Internal Medicine

## 2019-02-16 DIAGNOSIS — J45909 Unspecified asthma, uncomplicated: Secondary | ICD-10-CM

## 2019-02-16 MED ORDER — PROMETHAZINE-CODEINE 6.25-10 MG/5ML PO SYRP: 5.0000 mL | ORAL_SOLUTION | Freq: Four times a day (QID) | ORAL | 0 refills | Status: DC | PRN

## 2019-02-16 NOTE — Telephone Encounter (Signed)
Cough syrup refill e-sent 

## 2019-02-16 NOTE — Telephone Encounter (Signed)
Pt calling requesting a refill for promethazine-codeine syrup to go to [facility]. Pt last seen 10/03/2018 by CY. Promethazine-codeine syrup last filled 10/03/2018 240 mL Take 5 mL by mouth every 6 hours as needed for cough. Pt has f/u scheduled to see CY on 04/07/2019.   Current Outpatient Medications on File Prior to Visit  Medication Sig Dispense Refill  . albuterol (PROAIR HFA) 108 (90 Base) MCG/ACT inhaler Inhale 1-2 puffs into the lungs every 4 (four) hours as needed for wheezing or shortness of breath. 1 Inhaler 0  . albuterol (PROVENTIL) (2.5 MG/3ML) 0.083% nebulizer solution Take 3 mLs (2.5 mg total) by nebulization every 4 (four) hours as needed for wheezing or shortness of breath. 75 mL 0  . azelastine (ASTELIN) 137 MCG/SPRAY nasal spray Place 1 spray into the nose 2 (two) times daily. Use in each nostril as directed    . Beclomethasone Dipropionate (QNASL) 80 MCG/ACT AERS Place 1 spray into the nose daily. (Patient taking differently: Place 1 spray into the nose every morning. ) 1 Inhaler prn  . busPIRone (BUSPAR) 15 MG tablet Take 1 tablet by mouth Twice daily.    . Butalbital-APAP-Caffeine 50-300-40 MG CAPS butalbital-acetaminophen-caffeine 50 mg-300 mg-40 mg capsule    . carbamazepine (TEGRETOL) 200 MG tablet Take 200 mg by mouth 2 (two) times daily.    . Ciclesonide (ZETONNA) 37 MCG/ACT AERS 2 sprays daily.    . clotrimazole (LOTRIMIN) 1 % cream APPLY CREAM TOPICALLY TO AFFECTED AREA TWICE DAILY AS NEEDED  1  . CRESTOR 20 MG tablet Take 20 mg by mouth every morning.     . cyanocobalamin (,VITAMIN B-12,) 1000 MCG/ML injection Inject 1,000 mcg into the muscle every 30 (thirty) days. Mid-month    . cycloSPORINE (RESTASIS) 0.05 % ophthalmic emulsion Restasis 0.05 % eye drops in a dropperette    . diazepam (VALIUM) 5 MG tablet Take 5 mg by mouth at bedtime as needed for sedation.     . diclofenac (VOLTAREN) 75 MG EC tablet diclofenac sodium 75 mg tablet,delayed  release    . dicyclomine (BENTYL) 10 MG capsule     . ergocalciferol (VITAMIN D2) 50000 units capsule once a week.    . fluconazole (DIFLUCAN) 150 MG tablet fluconazole 150 mg tablet    . fluticasone (FLONASE) 50 MCG/ACT nasal spray Place 1-2 sprays into the nose at bedtime. 16 g 6  . fluticasone furoate-vilanterol (BREO ELLIPTA) 200-25 MCG/INH AEPB Breo Ellipta 200 mcg-25 mcg/dose powder for inhalation  INHALE 1 PUFF BY MOUTH DAILY    . gabapentin (NEURONTIN) 300 MG capsule Take 600 mg by mouth 2 (two) times daily.     Marland Kitchen glimepiride (AMARYL) 2 MG tablet Take 2 mg by mouth daily before breakfast.    . hydrocortisone 2.5 % cream hydrocortisone 2.5 % topical cream    . hydrOXYzine (ATARAX/VISTARIL) 25 MG tablet hydroxyzine HCl 25 mg tablet    . hyoscyamine (LEVSIN, ANASPAZ) 0.125 MG tablet Take 0.125 mg by mouth every 6 (six) hours as needed.    Marland Kitchen ipratropium (ATROVENT) 0.02 % nebulizer solution Take 2.5 mLs (0.5 mg total) by nebulization every 6 (six) hours as needed for wheezing or shortness of breath. 75 mL 12  . ipratropium (ATROVENT) 0.06 % nasal spray ipratropium bromide 42 mcg (0.06 %) nasal spray    . levalbuterol (XOPENEX HFA) 45 MCG/ACT inhaler Inhale 1-2 puffs into the lungs every 6 (six) hours as needed for wheezing or shortness of breath. 1 Inhaler prn  .  loratadine (CLARITIN) 10 MG tablet Take 10 mg by mouth every morning.     Marland Kitchen LORazepam (ATIVAN) 1 MG tablet lorazepam 1 mg tablet    . losartan-hydrochlorothiazide (HYZAAR) 100-25 MG per tablet Take 1 tablet by mouth daily.    . meclizine (ANTIVERT) 25 MG tablet Take 25 mg by mouth 3 (three) times daily as needed for dizziness.    . meloxicam (MOBIC) 15 MG tablet meloxicam 15 mg tablet    . metFORMIN (GLUCOPHAGE) 1000 MG tablet Take 1,000 mg by mouth 2 (two) times daily.    . mometasone-formoterol (DULERA) 200-5 MCG/ACT AERO Inhale 2 puffs into the lungs 2 (two) times daily. 1 Inhaler prn  . montelukast (SINGULAIR) 10 MG tablet Take  1 tablet (10 mg total) by mouth daily. (Patient taking differently: Take 10 mg by mouth every morning. ) 90 tablet 3  . moxifloxacin (VIGAMOX) 0.5 % ophthalmic solution Vigamox 0.5 % eye drops    . NEXIUM 40 MG capsule Take 1 capsule by mouth every morning.     . ondansetron (ZOFRAN-ODT) 4 MG disintegrating tablet ondansetron 4 mg disintegrating tablet    . oxybutynin (DITROPAN) 5 MG tablet oxybutynin chloride 5 mg tablet    . pantoprazole (PROTONIX) 40 MG tablet pantoprazole 40 mg tablet,delayed release    . polyvinyl alcohol (LIQUIFILM TEARS) 1.4 % ophthalmic solution Place 1 drop into both eyes 2 (two) times daily as needed for dry eyes.    . Potassium Chloride ER 20 MEQ TBCR potassium chloride ER 20 mEq tablet,extended release    . PRESCRIPTION MEDICATION Inject 1 each into the skin every 14 (fourteen) days. Allergy shot.    . promethazine-codeine (PHENERGAN WITH CODEINE) 6.25-10 MG/5ML syrup Take 5 mLs by mouth every 6 (six) hours as needed for cough. 240 mL 0  . Respiratory Therapy Supplies (FLUTTER) DEVI Blow through 4 times, repeat three times daily 1 each 0  . sertraline (ZOLOFT) 100 MG tablet Take 200 mg by mouth every morning.     . tamsulosin (FLOMAX) 0.4 MG CAPS capsule tamsulosin 0.4 mg capsule    . tiotropium (SPIRIVA) 18 MCG inhalation capsule Place 1 capsule (18 mcg total) into inhaler and inhale daily. 30 capsule 2  . tiZANidine (ZANAFLEX) 4 MG tablet Take 1 tablet (4 mg total) by mouth every 6 (six) hours as needed for muscle spasms. 40 tablet 0  . Vitamin D, Ergocalciferol, (DRISDOL) 50000 UNITS CAPS Take 1 capsule by mouth once a week. Monday     No current facility-administered medications on file prior to visit.    No Known Allergies   CY, please advise on the refill of this medication. Thank you.

## 2019-02-22 IMAGING — MR MR KNEE*R* W/O CM
4 of 5 series · 23 of 40 positions shown · non-contrast
Comparison: None.

CLINICAL DATA: Right knee pain for 1 year.  No known injury.

EXAM:
MRI OF THE RIGHT KNEE WITHOUT CONTRAST
TECHNIQUE: Multiplanar, multisequence MR imaging of the knee was performed. No
intravenous contrast was administered.

[Series 3: PD fat-sat · axial · 4.0mm · 0.31mm/px · z∈[-49,+114]mm · 9 of 38 slices shown (1 of 3)]
[im 1/38]
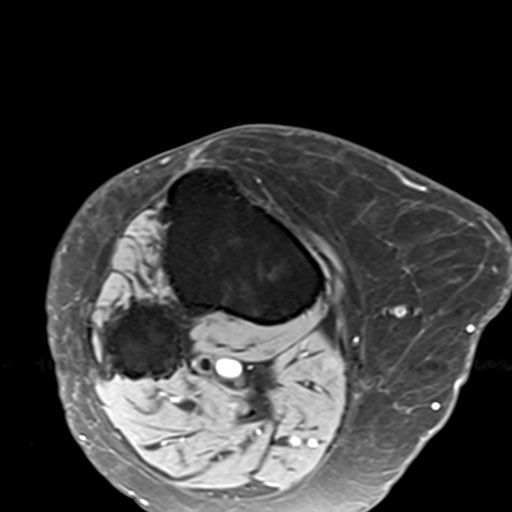
[im 5/38]
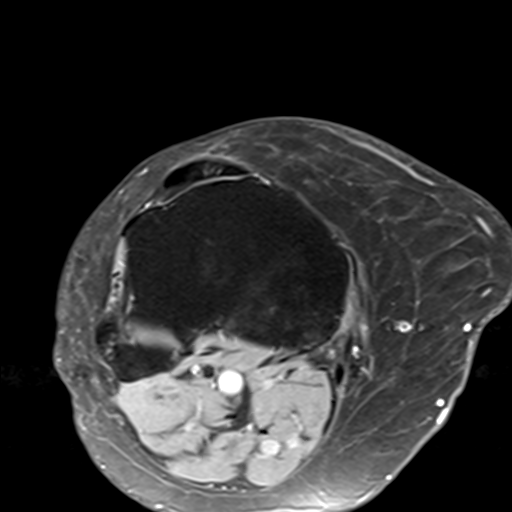
[im 10/38]
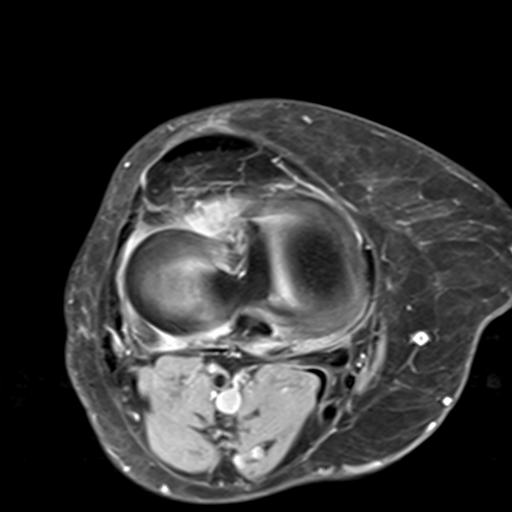
[im 14/38]
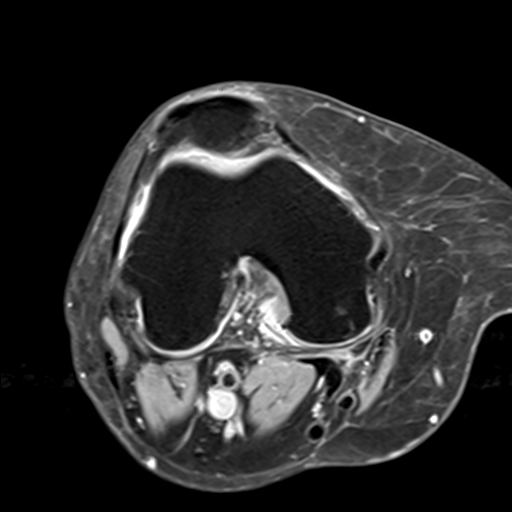
[im 19/38]
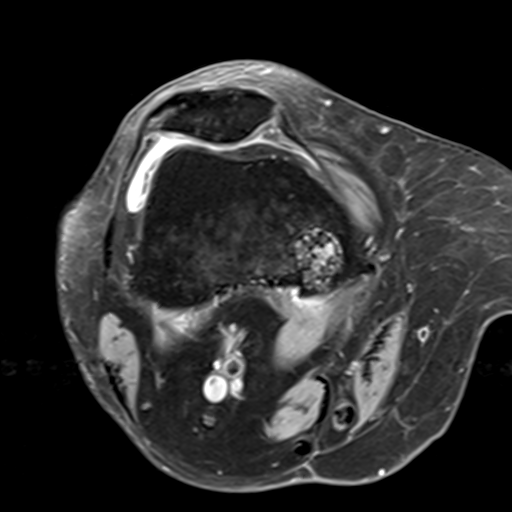
[im 24/38]
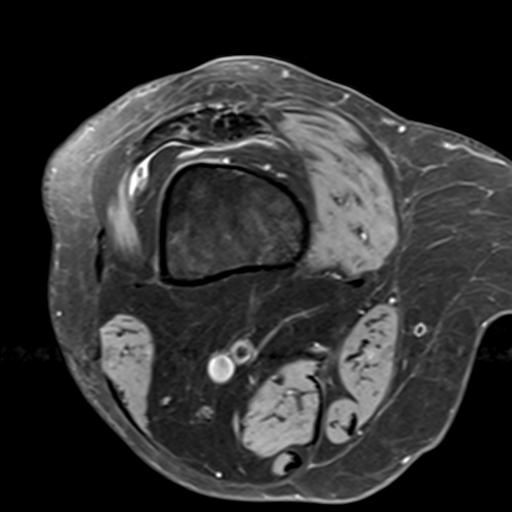
[im 28/38]
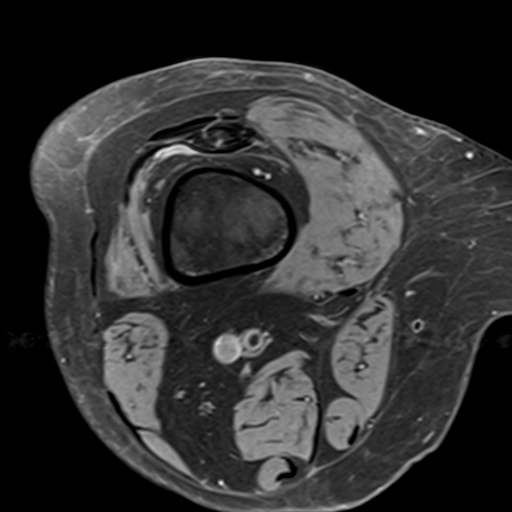
[im 33/38]
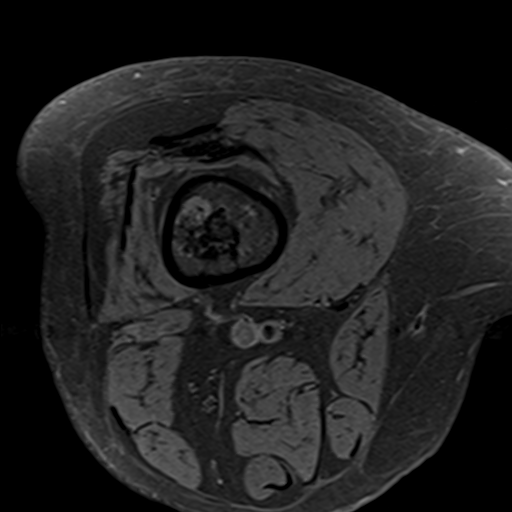
[im 38/38]
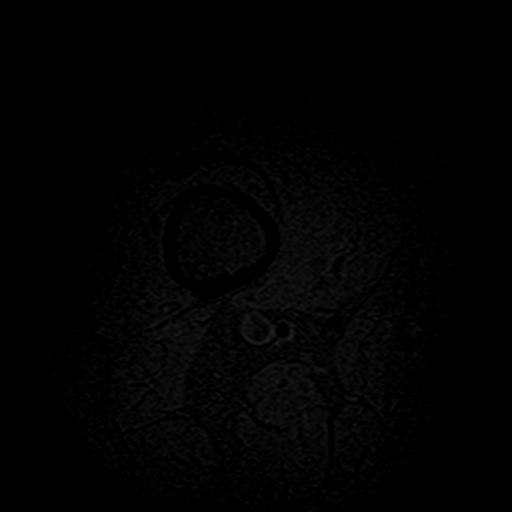

[Series 5: T2 fat-sat · coronal · 3.5mm · 0.35mm/px · 3 of 28 slices shown]
[im 4/28]
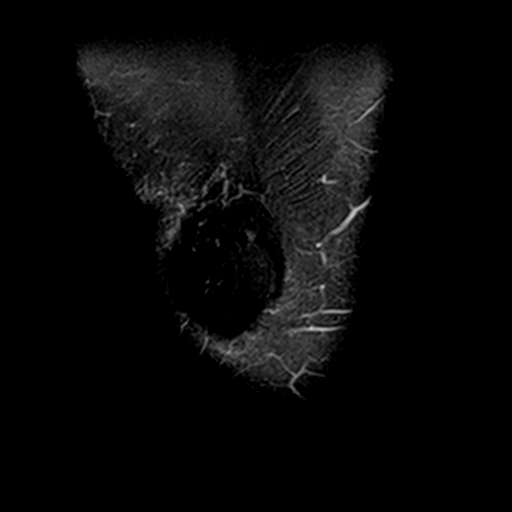
[im 16/28]
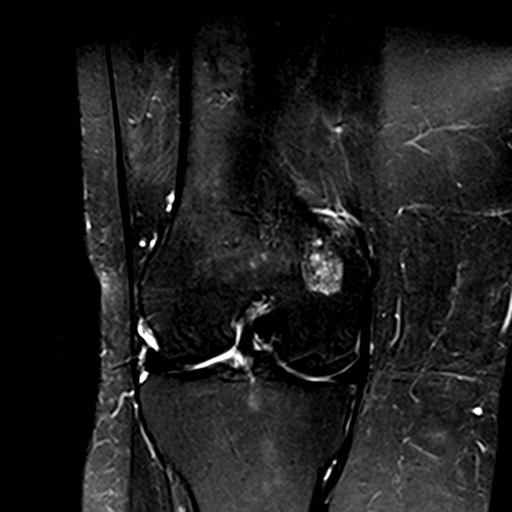
[im 24/28]
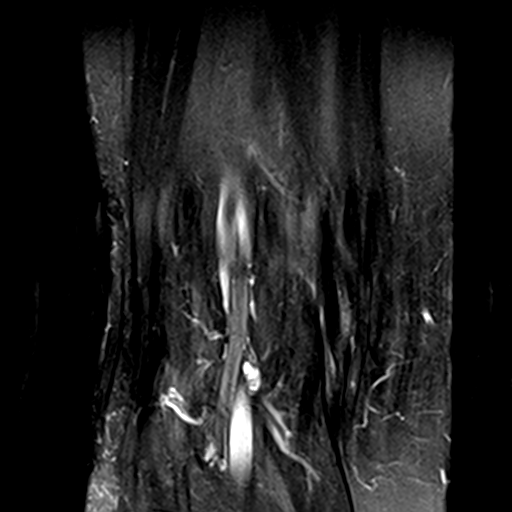

[Series 6: PD fat-sat · coronal · 3.5mm · 0.56mm/px · 8 of 28 slices shown (2 of 3)]
[im 1/28]
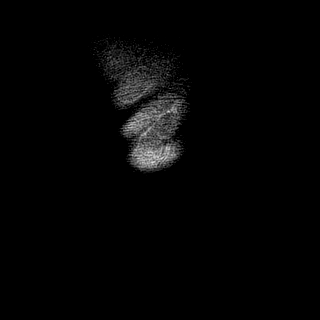
[im 4/28]
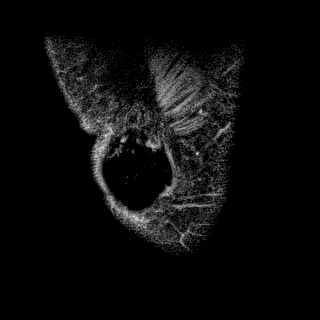
[im 8/28]
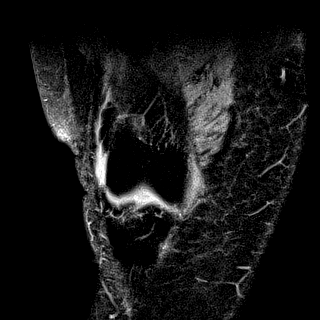
[im 12/28]
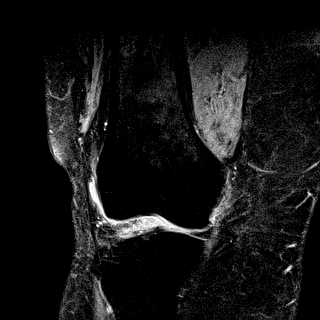
[im 16/28]
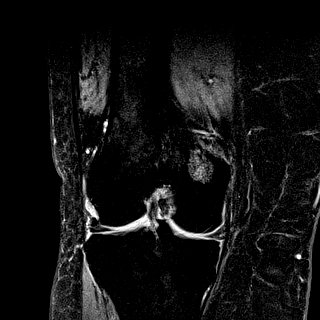
[im 20/28]
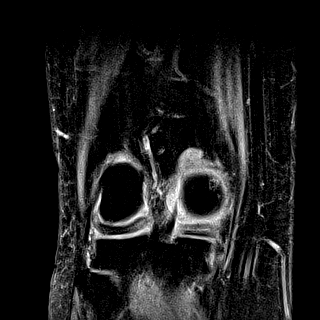
[im 24/28]
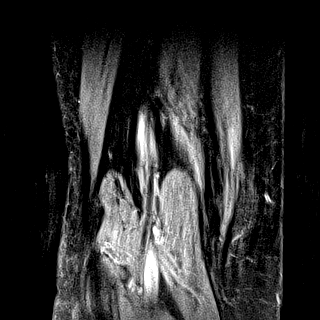
[im 28/28]
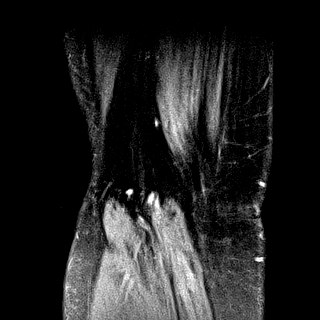

[Series 7: PD fat-sat · sagittal · 3.5mm · 0.35mm/px · 3 of 26 slices shown (3 of 3)]
[im 5/26]
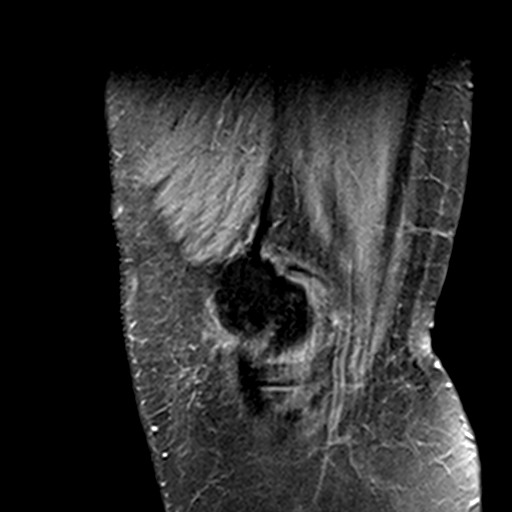
[im 13/26]
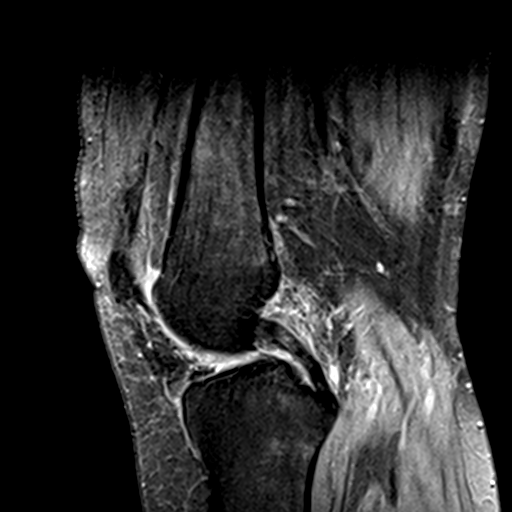
[im 21/26]
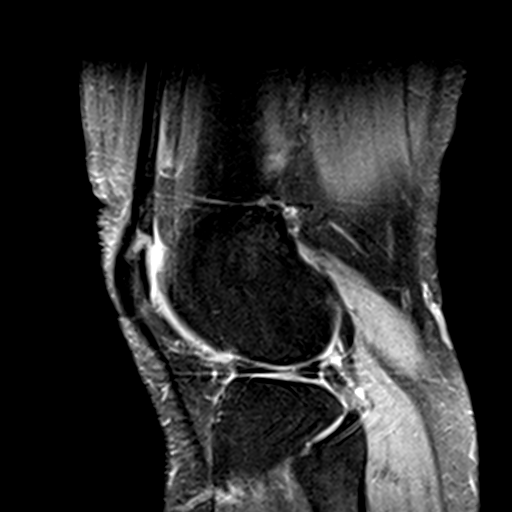

[23 of 40 positions shown; findings below may reference images not displayed]

FINDINGS: MENISCI

Medial meniscus: Large horizontal tear in the posterior horn reaches
the meniscal undersurface.

Lateral meniscus: Degenerative signal is seen within the meniscus
without tear.

LIGAMENTS

Cruciates:  Intact.

Collaterals:  Intact.

CARTILAGE

Patellofemoral: Cartilage is diffusely thinned, worst along the
lateral patellar facet and femoral trochlea.

Medial:  Mildly degenerated and thinned without focal defect.

Lateral:  Mildly degenerated.

Joint:  Small joint effusion.

Popliteal Fossa:  No Baker's cyst.

Extensor Mechanism:  Intact.

Bones: No fracture. There is a chondroid lesion in the distal
diaphysis of the femur with extensive calcified matrix. The lesion
measures 1.8 cm AP by 2.9 cm craniocaudal by 2.0 cm transverse. A
second chondroid lesion in the medial metaphysis of the femur
measures 2.4 cm craniocaudal by 1.5 cm transverse by 1.8 cm AP and
also has fairly extensive calcified matrix. No endosteal scalloping,
cortical break or soft tissue mass in association with these lesions
is identified. Also seen is a medullary infarct in the posterior
aspect of the medial femoral condyles measuring 0.6 cm transverse by
0.9 cm AP by 1 cm craniocaudal.

Other: None.
IMPRESSION: Large horizontal tear posterior horn medial meniscus.

Osteoarthritis about the knee appearing worst in the patellofemoral
compartment where it is moderate to moderately severe.

2 chondroid lesions in the distal femur and have an appearance most
consistent with benign enchondromas. A small medullary infarct in
the posterior aspect of the medial femoral condyle is also seen.

## 2019-04-07 ENCOUNTER — Other Ambulatory Visit: Payer: Self-pay

## 2019-04-07 ENCOUNTER — Encounter: Payer: Self-pay | Admitting: Internal Medicine

## 2019-04-07 ENCOUNTER — Ambulatory Visit (INDEPENDENT_AMBULATORY_CARE_PROVIDER_SITE_OTHER): Payer: Medicare Other | Admitting: Internal Medicine

## 2019-04-07 DIAGNOSIS — G4733 Obstructive sleep apnea (adult) (pediatric): Secondary | ICD-10-CM | POA: Diagnosis not present

## 2019-04-07 DIAGNOSIS — J383 Other diseases of vocal cords: Secondary | ICD-10-CM | POA: Diagnosis not present

## 2019-04-07 DIAGNOSIS — J45909 Unspecified asthma, uncomplicated: Secondary | ICD-10-CM | POA: Diagnosis not present

## 2019-04-07 MED ORDER — PROMETHAZINE-CODEINE 6.25-10 MG/5ML PO SYRP: 5.0000 mL | ORAL_SOLUTION | Freq: Four times a day (QID) | ORAL | 0 refills | Status: DC | PRN

## 2019-04-07 NOTE — Patient Instructions (Addendum)
Cough syrup refill e-sent  We can continue routine meds  Ok to continue CPAP 11, mask of choice, humidifier, supplies, [employer]view/ card  Please cal if we can help

## 2019-04-07 NOTE — Progress Notes (Signed)
Patient ID: Joaquin Bend, female    DOB: 19-Feb-1951, 68 y.o.   MRN: [id number]  HPI 10/16/10- 30 yo never smoker followed for asthma/ bronchiectasis, lung nodules, allergic rhinitis, OSA, vocal cord paresis/ hoarseness. PFT: 07/10/2015-mild obstructive airways disease with minimal/insignificant response to bronchodilator, diffusion mildly reduced. FVC 2.98/82%, FEV1 2.32/83%, FEV1/FVC 0.78, TLC 83%, DLCO 66% NPSG 04/06/2000-AHI 33/hour, desaturation to 81% Home Sleep Test 09/19/15-AHI 37.5/hour, desaturation to 84%, body weight 273 pounds.  --------------------------------------------------------------- 10/03/2018- 68 year old female never smoker for cough, allergic rhinitis, vocal cord paralysis/hoarseness / stents/ LPR, lung nodules, OSA CPAP auto set 11/[employer] Smart Vest, pro-air HFA, Breo 200/ Dulera 200, Spiriva HandiHaler, neb albuterol/ ipratropium, Qnasl,  Doing fine with CPAP used every night. No concerns Breathing otherwise unchanged with stable persistent wheeze and cough. No obvious aspiration events recognized. Asks refill cough syrup. Observations/Objective: Download- not available today CXR 11/05/17 IMPRESSION: No acute abnormality noted. Previously seen pulmonary nodules are not well appreciated due to their size. Assessment and Plan: OSA good compliance and control- plan- continue CPAP 11 Chronic bronchiectasis- chronic microaspiration due to vocal cord paralysis/ stent Follow Up Instructions: 6 months  04/07/2019- 68 year old female never smoker for cough, allergic rhinitis, vocal cord paralysis/hoarseness / stents/ LPR, lung nodules, OSA -----f/u Asthma/OSA  CPAP auto set 11/[employer] Smart Vest, pro-air HFA, Breo 200, Spiriva HandiHaler, neb albuterol/ ipratropium, Qnasl, Download- compliance 100%, AHI 2/ hr Body weight today 228 lbs Had flu vax Uses rescue inhaler about 2x/ week, especially now with weather change. Not using her nebulizer.  CXR  10/06/2018- IMPRESSION: No active cardiopulmonary disease.  Review of Systems- see HPI + = positive Constitutional:   No-   weight loss, night sweats, fevers, chills, fatigue, lassitude. HEENT:   No-  headaches, difficulty swallowing, tooth/dental problems, sore throat,       No-  sneezing, itching, ear ache, nasal congestion, post nasal drip,  CV:  No-   chest pain, orthopnea, PND, swelling in lower extremities, anasarca, dizziness, palpitations Resp: +shortness of breath with exertion or at rest.                productive cough,  + non-productive cough,  No- coughing up of blood.              No-   change in color of mucus.  + wheezing.   Skin: No-   rash or lesions. GI:  No-   heartburn, indigestion, abdominal pain, nausea, vomiting,  GU MS:  Neuro-     nothing unusual Psych:  No- change in mood or affect. No depression or anxiety.  No memory loss.    Objective:   Physical Exam General- Alert, Oriented, Affect-appropriate, Distress- none acute, +obese Skin- rash-none, lesions- none, excoriation- none Lymphadenopathy- none Head- atraumatic            Eyes- Gross vision intact, PERRLA, conjunctivae clear secretions            Ears- Hearing, canals-normal            Nose- Clear, no-Septal dev, mucus, polyps, erosion, perforation.              Throat- Mallampati IV , mucosa clear , drainage- none, tonsils- atrophic,  + remains hoarse, without stridor              Neck- flexible , trachea midline, no stridor , thyroid nl, carotid no bruit Chest - symmetrical excursion , unlabored           Heart/CV- RRR ,  no murmur , no gallop  , no rub, nl s1 s2                           - JVD- none , edema- none, stasis changes- none, varices- none           Lung- clear to P&A, wheeze- none, cough-None , dullness-none, rub- none           Chest wall-  Abd- Br/ Gen/ Rectal- Not done, not indicated Extrem- cyanosis- none, clubbing, none, atrophy- none, strength- nl.  Neuro- grossly intact to  observation  10/03/2018- Virtual Visit via Telephone Note History of Present Illness:

## 2019-04-08 NOTE — Assessment & Plan Note (Signed)
Always working to prevent LPR/ aspiration.

## 2019-04-08 NOTE — Assessment & Plan Note (Signed)
Mild seasonal exacerbation, but within control with current meds

## 2019-04-08 NOTE — Assessment & Plan Note (Signed)
Benefits from CPAP with good compliance and control. Plan-continue CPAP 11 

## 2019-05-10 ENCOUNTER — Ambulatory Visit: Payer: Medicare Other | Admitting: Internal Medicine

## 2019-06-09 ENCOUNTER — Ambulatory Visit: Payer: Medicare Other | Attending: Internal Medicine

## 2019-06-09 DIAGNOSIS — Z23 Encounter for immunization: Secondary | ICD-10-CM

## 2019-06-09 NOTE — Progress Notes (Signed)
   Covid-19 Vaccination Clinic  Name:  Dominique Robbins    MRN: [id number] DOB: Aug 30, 1950  06/09/2019  [name] was observed post Covid-19 immunization for 15 minutes without incidence. She was provided with Vaccine Information Sheet and instruction to access the V-Safe system.   [name] was instructed to call 911 with any severe reactions post vaccine: Marland Kitchen Difficulty breathing  . Swelling of your face and throat  . A fast heartbeat  . A bad rash all over your body  . Dizziness and weakness    Immunizations Administered    Name Date Dose VIS Date Route   Pfizer COVID-19 Vaccine 06/09/2019 11:55 AM 0.3 mL 04/28/2019 Intramuscular   Manufacturer: [employer], [employer]   Lot: [id number]   NDC: [address]-3

## 2019-06-30 ENCOUNTER — Ambulatory Visit: Payer: Medicare Other | Attending: Internal Medicine

## 2019-06-30 DIAGNOSIS — Z23 Encounter for immunization: Secondary | ICD-10-CM

## 2019-06-30 NOTE — Progress Notes (Signed)
   Covid-19 Vaccination Clinic  Name:  Dominique Robbins    MRN: [id number] DOB: 06-28-1950  06/30/2019  Ms. Gisler was observed post Covid-19 immunization for 15 minutes without incidence. She was provided with Vaccine Information Sheet and instruction to access the V-Safe system.   Ms. Harral was instructed to call 911 with any severe reactions post vaccine: Marland Kitchen Difficulty breathing  . Swelling of your face and throat  . A fast heartbeat  . A bad rash all over your body  . Dizziness and weakness    Immunizations Administered    Name Date Dose VIS Date Route   Pfizer COVID-19 Vaccine 06/30/2019 10:59 AM 0.3 mL 04/28/2019 Intramuscular   Manufacturer: [employer], [employer]   Lot: [id number]   NDC: [id number]

## 2019-08-02 ENCOUNTER — Telehealth: Payer: Self-pay | Admitting: Internal Medicine

## 2019-08-02 DIAGNOSIS — J45909 Unspecified asthma, uncomplicated: Secondary | ICD-10-CM

## 2019-08-02 MED ORDER — PROMETHAZINE-CODEINE 6.25-10 MG/5ML PO SYRP: 5.0000 mL | ORAL_SOLUTION | Freq: Four times a day (QID) | ORAL | 0 refills | Status: DC | PRN

## 2019-08-02 NOTE — Telephone Encounter (Signed)
Called and spoke with Patient. Patient stated she needed a refill for promethazine-codeine, to be sent to [facility]. Patient stated she is having her non productive cough, described as her normal.  Promethazine-codeine # 240, no refills last sent 04/07/19  Message routed to Dr. Maple Hudson  No Known Allergies  Current Outpatient Medications on File Prior to Visit  Medication Sig Dispense Refill  . albuterol (PROAIR HFA) 108 (90 Base) MCG/ACT inhaler Inhale 1-2 puffs into the lungs every 4 (four) hours as needed for wheezing or shortness of breath. 1 Inhaler 0  . albuterol (PROVENTIL) (2.5 MG/3ML) 0.083% nebulizer solution Take 3 mLs (2.5 mg total) by nebulization every 4 (four) hours as needed for wheezing or shortness of breath. 75 mL 0  . azelastine (ASTELIN) 137 MCG/SPRAY nasal spray Place 1 spray into the nose 2 (two) times daily. Use in each nostril as directed    . Beclomethasone Dipropionate (QNASL) 80 MCG/ACT AERS Place 1 spray into the nose daily. (Patient taking differently: Place 1 spray into the nose every morning. ) 1 Inhaler prn  . busPIRone (BUSPAR) 15 MG tablet Take 1 tablet by mouth Twice daily.    . Butalbital-APAP-Caffeine 50-300-40 MG CAPS butalbital-acetaminophen-caffeine 50 mg-300 mg-40 mg capsule    . carbamazepine (TEGRETOL) 200 MG tablet Take 200 mg by mouth 2 (two) times daily.    . Ciclesonide (ZETONNA) 37 MCG/ACT AERS 2 sprays daily.    . clotrimazole (LOTRIMIN) 1 % cream APPLY CREAM TOPICALLY TO AFFECTED AREA TWICE DAILY AS NEEDED  1  . CRESTOR 20 MG tablet Take 20 mg by mouth every morning.     . cyanocobalamin (,VITAMIN B-12,) 1000 MCG/ML injection Inject 1,000 mcg into the muscle every 30 (thirty) days. Mid-month    . cycloSPORINE (RESTASIS) 0.05 % ophthalmic emulsion Restasis 0.05 % eye drops in a dropperette    . diazepam (VALIUM) 5 MG tablet Take 5 mg by mouth at bedtime as needed for sedation.     . diclofenac (VOLTAREN) 75 MG EC tablet diclofenac  sodium 75 mg tablet,delayed release    . dicyclomine (BENTYL) 10 MG capsule     . ergocalciferol (VITAMIN D2) 50000 units capsule once a week.    . fluconazole (DIFLUCAN) 150 MG tablet fluconazole 150 mg tablet    . fluticasone (FLONASE) 50 MCG/ACT nasal spray Place 1-2 sprays into the nose at bedtime. 16 g 6  . fluticasone furoate-vilanterol (BREO ELLIPTA) 200-25 MCG/INH AEPB Breo Ellipta 200 mcg-25 mcg/dose powder for inhalation  INHALE 1 PUFF BY MOUTH DAILY    . gabapentin (NEURONTIN) 300 MG capsule Take 600 mg by mouth 2 (two) times daily.     Marland Kitchen glimepiride (AMARYL) 2 MG tablet Take 2 mg by mouth daily before breakfast.    . hydrocortisone 2.5 % cream hydrocortisone 2.5 % topical cream    . hydrOXYzine (ATARAX/VISTARIL) 25 MG tablet hydroxyzine HCl 25 mg tablet    . hyoscyamine (LEVSIN, ANASPAZ) 0.125 MG tablet Take 0.125 mg by mouth every 6 (six) hours as needed.    Marland Kitchen ipratropium (ATROVENT) 0.02 % nebulizer solution Take 2.5 mLs (0.5 mg total) by nebulization every 6 (six) hours as needed for wheezing or shortness of breath. 75 mL 12  . ipratropium (ATROVENT) 0.06 % nasal spray ipratropium bromide 42 mcg (0.06 %) nasal spray    . levalbuterol (XOPENEX HFA) 45 MCG/ACT inhaler Inhale 1-2 puffs into the lungs every 6 (six) hours as needed for wheezing or shortness of breath. 1 Inhaler prn  .  loratadine (CLARITIN) 10 MG tablet Take 10 mg by mouth every morning.     Marland Kitchen LORazepam (ATIVAN) 1 MG tablet lorazepam 1 mg tablet    . losartan-hydrochlorothiazide (HYZAAR) 100-25 MG per tablet Take 1 tablet by mouth daily.    . meclizine (ANTIVERT) 25 MG tablet Take 25 mg by mouth 3 (three) times daily as needed for dizziness.    . meloxicam (MOBIC) 15 MG tablet meloxicam 15 mg tablet    . metFORMIN (GLUCOPHAGE) 1000 MG tablet Take 1,000 mg by mouth 2 (two) times daily.    . mometasone-formoterol (DULERA) 200-5 MCG/ACT AERO Inhale 2 puffs into the lungs 2 (two) times daily. 1 Inhaler prn  . montelukast  (SINGULAIR) 10 MG tablet Take 1 tablet (10 mg total) by mouth daily. (Patient taking differently: Take 10 mg by mouth every morning. ) 90 tablet 3  . moxifloxacin (VIGAMOX) 0.5 % ophthalmic solution Vigamox 0.5 % eye drops    . NEXIUM 40 MG capsule Take 1 capsule by mouth every morning.     . ondansetron (ZOFRAN-ODT) 4 MG disintegrating tablet ondansetron 4 mg disintegrating tablet    . oxybutynin (DITROPAN) 5 MG tablet oxybutynin chloride 5 mg tablet    . pantoprazole (PROTONIX) 40 MG tablet pantoprazole 40 mg tablet,delayed release    . polyvinyl alcohol (LIQUIFILM TEARS) 1.4 % ophthalmic solution Place 1 drop into both eyes 2 (two) times daily as needed for dry eyes.    . Potassium Chloride ER 20 MEQ TBCR potassium chloride ER 20 mEq tablet,extended release    . PRESCRIPTION MEDICATION Inject 1 each into the skin every 14 (fourteen) days. Allergy shot.    . promethazine-codeine (PHENERGAN WITH CODEINE) 6.25-10 MG/5ML syrup Take 5 mLs by mouth every 6 (six) hours as needed for cough. 240 mL 0  . Respiratory Therapy Supplies (FLUTTER) DEVI Blow through 4 times, repeat three times daily 1 each 0  . sertraline (ZOLOFT) 100 MG tablet Take 200 mg by mouth every morning.     . tamsulosin (FLOMAX) 0.4 MG CAPS capsule tamsulosin 0.4 mg capsule    . tiotropium (SPIRIVA) 18 MCG inhalation capsule Place 1 capsule (18 mcg total) into inhaler and inhale daily. 30 capsule 2  . tiZANidine (ZANAFLEX) 4 MG tablet Take 1 tablet (4 mg total) by mouth every 6 (six) hours as needed for muscle spasms. 40 tablet 0  . Vitamin D, Ergocalciferol, (DRISDOL) 50000 UNITS CAPS Take 1 capsule by mouth once a week. Monday     No current facility-administered medications on file prior to visit.

## 2019-08-02 NOTE — Telephone Encounter (Signed)
Cough syrup refill e-sent 

## 2019-08-30 ENCOUNTER — Telehealth: Payer: Self-pay | Admitting: Internal Medicine

## 2019-08-30 DIAGNOSIS — J45909 Unspecified asthma, uncomplicated: Secondary | ICD-10-CM

## 2019-08-30 MED ORDER — PROMETHAZINE-CODEINE 6.25-10 MG/5ML PO SYRP: 5.0000 mL | ORAL_SOLUTION | Freq: Four times a day (QID) | ORAL | 0 refills | Status: DC | PRN

## 2019-08-30 NOTE — Telephone Encounter (Signed)
Spoke with the pt and notified that this was done

## 2019-08-30 NOTE — Telephone Encounter (Signed)
Cough syrup refill e-sent 

## 2019-08-30 NOTE — Telephone Encounter (Signed)
Called and spoke with pt. Pt is requesting to have her phenergan with codeine cough syrup refilled. Dr. Annamaria Boots please advise if you are okay with refilling med. [facility] in [place] [address] is preferred pharmacy  No Known Allergies   Current Outpatient Medications:  .  albuterol (PROAIR HFA) 108 (90 Base) MCG/ACT inhaler, Inhale 1-2 puffs into the lungs every 4 (four) hours as needed for wheezing or shortness of breath., Disp: 1 Inhaler, Rfl: 0 .  albuterol (PROVENTIL) (2.5 MG/3ML) 0.083% nebulizer solution, Take 3 mLs (2.5 mg total) by nebulization every 4 (four) hours as needed for wheezing or shortness of breath., Disp: 75 mL, Rfl: 0 .  azelastine (ASTELIN) 137 MCG/SPRAY nasal spray, Place 1 spray into the nose 2 (two) times daily. Use in each nostril as directed, Disp: , Rfl:  .  Beclomethasone Dipropionate (QNASL) 80 MCG/ACT AERS, Place 1 spray into the nose daily. (Patient taking differently: Place 1 spray into the nose every morning. ), Disp: 1 Inhaler, Rfl: prn .  busPIRone (BUSPAR) 15 MG tablet, Take 1 tablet by mouth Twice daily., Disp: , Rfl:  .  Butalbital-APAP-Caffeine 50-300-40 MG CAPS, butalbital-acetaminophen-caffeine 50 mg-300 mg-40 mg capsule, Disp: , Rfl:  .  carbamazepine (TEGRETOL) 200 MG tablet, Take 200 mg by mouth 2 (two) times daily., Disp: , Rfl:  .  Ciclesonide (ZETONNA) 37 MCG/ACT AERS, 2 sprays daily., Disp: , Rfl:  .  clotrimazole (LOTRIMIN) 1 % cream, APPLY CREAM TOPICALLY TO AFFECTED AREA TWICE DAILY AS NEEDED, Disp: , Rfl: 1 .  CRESTOR 20 MG tablet, Take 20 mg by mouth every morning. , Disp: , Rfl:  .  cyanocobalamin (,VITAMIN B-12,) 1000 MCG/ML injection, Inject 1,000 mcg into the muscle every 30 (thirty) days. Mid-month, Disp: , Rfl:  .  cycloSPORINE (RESTASIS) 0.05 % ophthalmic emulsion, Restasis 0.05 % eye drops in a dropperette, Disp: , Rfl:  .  diazepam (VALIUM) 5 MG tablet, Take 5 mg by mouth at bedtime as needed for sedation. , Disp: , Rfl:  .   diclofenac (VOLTAREN) 75 MG EC tablet, diclofenac sodium 75 mg tablet,delayed release, Disp: , Rfl:  .  dicyclomine (BENTYL) 10 MG capsule, , Disp: , Rfl:  .  ergocalciferol (VITAMIN D2) 50000 units capsule, once a week., Disp: , Rfl:  .  fluconazole (DIFLUCAN) 150 MG tablet, fluconazole 150 mg tablet, Disp: , Rfl:  .  fluticasone (FLONASE) 50 MCG/ACT nasal spray, Place 1-2 sprays into the nose at bedtime., Disp: 16 g, Rfl: 6 .  fluticasone furoate-vilanterol (BREO ELLIPTA) 200-25 MCG/INH AEPB, Breo Ellipta 200 mcg-25 mcg/dose powder for inhalation  INHALE 1 PUFF BY MOUTH DAILY, Disp: , Rfl:  .  gabapentin (NEURONTIN) 300 MG capsule, Take 600 mg by mouth 2 (two) times daily. , Disp: , Rfl:  .  glimepiride (AMARYL) 2 MG tablet, Take 2 mg by mouth daily before breakfast., Disp: , Rfl:  .  hydrocortisone 2.5 % cream, hydrocortisone 2.5 % topical cream, Disp: , Rfl:  .  hydrOXYzine (ATARAX/VISTARIL) 25 MG tablet, hydroxyzine HCl 25 mg tablet, Disp: , Rfl:  .  hyoscyamine (LEVSIN, ANASPAZ) 0.125 MG tablet, Take 0.125 mg by mouth every 6 (six) hours as needed., Disp: , Rfl:  .  ipratropium (ATROVENT) 0.02 % nebulizer solution, Take 2.5 mLs (0.5 mg total) by nebulization every 6 (six) hours as needed for wheezing or shortness of breath., Disp: 75 mL, Rfl: 12 .  ipratropium (ATROVENT) 0.06 % nasal spray, ipratropium bromide 42 mcg (0.06 %) nasal  spray, Disp: , Rfl:  .  levalbuterol (XOPENEX HFA) 45 MCG/ACT inhaler, Inhale 1-2 puffs into the lungs every 6 (six) hours as needed for wheezing or shortness of breath., Disp: 1 Inhaler, Rfl: prn .  loratadine (CLARITIN) 10 MG tablet, Take 10 mg by mouth every morning. , Disp: , Rfl:  .  LORazepam (ATIVAN) 1 MG tablet, lorazepam 1 mg tablet, Disp: , Rfl:  .  losartan-hydrochlorothiazide (HYZAAR) 100-25 MG per tablet, Take 1 tablet by mouth daily., Disp: , Rfl:  .  meclizine (ANTIVERT) 25 MG tablet, Take 25 mg by mouth 3 (three) times daily as needed for dizziness.,  Disp: , Rfl:  .  meloxicam (MOBIC) 15 MG tablet, meloxicam 15 mg tablet, Disp: , Rfl:  .  metFORMIN (GLUCOPHAGE) 1000 MG tablet, Take 1,000 mg by mouth 2 (two) times daily., Disp: , Rfl:  .  mometasone-formoterol (DULERA) 200-5 MCG/ACT AERO, Inhale 2 puffs into the lungs 2 (two) times daily., Disp: 1 Inhaler, Rfl: prn .  montelukast (SINGULAIR) 10 MG tablet, Take 1 tablet (10 mg total) by mouth daily. (Patient taking differently: Take 10 mg by mouth every morning. ), Disp: 90 tablet, Rfl: 3 .  moxifloxacin (VIGAMOX) 0.5 % ophthalmic solution, Vigamox 0.5 % eye drops, Disp: , Rfl:  .  NEXIUM 40 MG capsule, Take 1 capsule by mouth every morning. , Disp: , Rfl:  .  ondansetron (ZOFRAN-ODT) 4 MG disintegrating tablet, ondansetron 4 mg disintegrating tablet, Disp: , Rfl:  .  oxybutynin (DITROPAN) 5 MG tablet, oxybutynin chloride 5 mg tablet, Disp: , Rfl:  .  pantoprazole (PROTONIX) 40 MG tablet, pantoprazole 40 mg tablet,delayed release, Disp: , Rfl:  .  polyvinyl alcohol (LIQUIFILM TEARS) 1.4 % ophthalmic solution, Place 1 drop into both eyes 2 (two) times daily as needed for dry eyes., Disp: , Rfl:  .  Potassium Chloride ER 20 MEQ TBCR, potassium chloride ER 20 mEq tablet,extended release, Disp: , Rfl:  .  PRESCRIPTION MEDICATION, Inject 1 each into the skin every 14 (fourteen) days. Allergy shot., Disp: , Rfl:  .  promethazine-codeine (PHENERGAN WITH CODEINE) 6.25-10 MG/5ML syrup, Take 5 mLs by mouth every 6 (six) hours as needed for cough., Disp: 240 mL, Rfl: 0 .  Respiratory Therapy Supplies (FLUTTER) DEVI, Blow through 4 times, repeat three times daily, Disp: 1 each, Rfl: 0 .  sertraline (ZOLOFT) 100 MG tablet, Take 200 mg by mouth every morning. , Disp: , Rfl:  .  tamsulosin (FLOMAX) 0.4 MG CAPS capsule, tamsulosin 0.4 mg capsule, Disp: , Rfl:  .  tiotropium (SPIRIVA) 18 MCG inhalation capsule, Place 1 capsule (18 mcg total) into inhaler and inhale daily., Disp: 30 capsule, Rfl: 2 .  tiZANidine  (ZANAFLEX) 4 MG tablet, Take 1 tablet (4 mg total) by mouth every 6 (six) hours as needed for muscle spasms., Disp: 40 tablet, Rfl: 0 .  Vitamin D, Ergocalciferol, (DRISDOL) 50000 UNITS CAPS, Take 1 capsule by mouth once a week. Monday, Disp: , Rfl:

## 2019-09-28 ENCOUNTER — Encounter: Payer: Self-pay | Admitting: Internal Medicine

## 2019-10-05 ENCOUNTER — Ambulatory Visit (INDEPENDENT_AMBULATORY_CARE_PROVIDER_SITE_OTHER): Payer: Medicare Other | Admitting: Internal Medicine

## 2019-10-05 ENCOUNTER — Other Ambulatory Visit: Payer: Self-pay

## 2019-10-05 ENCOUNTER — Encounter: Payer: Self-pay | Admitting: Internal Medicine

## 2019-10-05 DIAGNOSIS — J479 Bronchiectasis, uncomplicated: Secondary | ICD-10-CM | POA: Diagnosis not present

## 2019-10-05 DIAGNOSIS — J383 Other diseases of vocal cords: Secondary | ICD-10-CM

## 2019-10-05 DIAGNOSIS — J45909 Unspecified asthma, uncomplicated: Secondary | ICD-10-CM

## 2019-10-05 MED ORDER — PROMETHAZINE-CODEINE 6.25-10 MG/5ML PO SYRP: 5.0000 mL | ORAL_SOLUTION | Freq: Four times a day (QID) | ORAL | 0 refills | Status: DC | PRN

## 2019-10-05 MED ORDER — BREZTRI AEROSPHERE 160-9-4.8 MCG/ACT IN AERO: 2.0000 | INHALATION_SPRAY | Freq: Two times a day (BID) | RESPIRATORY_TRACT | 0 refills | Status: DC

## 2019-10-05 NOTE — Assessment & Plan Note (Signed)
Had best available management, but continues chronic low-grade LPR.

## 2019-10-05 NOTE — Progress Notes (Signed)
Patient ID: Dominique Robbins, female    DOB: 1950-12-28, 69 y.o.   MRN: [id number]  HPI 10/16/10- 73 yo never smoker followed for asthma/ bronchiectasis, lung nodules, allergic rhinitis, OSA, vocal cord paresis/ hoarseness. PFT: 07/10/2015-mild obstructive airways disease with minimal/insignificant response to bronchodilator, diffusion mildly reduced. FVC 2.98/82%, FEV1 2.32/83%, FEV1/FVC 0.78, TLC 83%, DLCO 66% NPSG 04/06/2000-AHI 33/hour, desaturation to 81% Home Sleep Test 09/19/15-AHI 37.5/hour, desaturation to 84%, body weight 273 pounds.  ---------------------------------------------------------------  04/07/2019- 69 year old female never smoker for cough, allergic rhinitis, vocal cord paralysis/hoarseness / stents/ LPR, lung nodules, OSA -----f/u Asthma/OSA  CPAP auto set 11/[employer] Smart Vest, pro-air HFA, Breo 200, Spiriva HandiHaler, neb albuterol/ ipratropium, Qnasl, Download- compliance 100%, AHI 2/ hr Body weight today 228 lbs Had flu vax Uses rescue inhaler about 2x/ week, especially now with weather change. Not using her nebulizer.  CXR 10/06/2018- IMPRESSION: No active cardiopulmonary disease.  10/05/19- 69 year old female never smoker for cough, allergic rhinitis, vocal cord paralysis/hoarseness / stents/ LPR, lung nodules, OSA  -----f/u Asthma with bronchitis/OSA Smart Vest, pro-air HFA, Dulera 200, Spiriva HandiHaler, neb albuterol/ ipratropium, Qnasl, CPAP 11/ [employer] Download compliance 100%, AHI 1.2/hr Had 2 Phizer Covax Body weight today 223 lbs Remains comfortable with CPAP- working well. Chronic dry cough related to LPR. Cough syrup very helpful. Asks about CXR f/u hx of lung nodule. Smart Vest helps when needed.   Review of Systems- see HPI + = positive Constitutional:   No-   weight loss, night sweats, fevers, chills, fatigue, lassitude. HEENT:   No-  headaches, difficulty swallowing, tooth/dental problems, sore throat,       No-  sneezing, itching, ear ache,  nasal congestion, post nasal drip,  CV:  No-   chest pain, orthopnea, PND, swelling in lower extremities, anasarca, dizziness, palpitations Resp: +shortness of breath with exertion or at rest.                productive cough,  + non-productive cough,  No- coughing up of blood.              No-   change in color of mucus.  + wheezing.   Skin: No-   rash or lesions. GI:  No-   heartburn, indigestion, abdominal pain, nausea, vomiting,  GU MS:  Neuro-     nothing unusual Psych:  No- change in mood or affect. No depression or anxiety.  No memory loss.    Objective:   Physical Exam General- Alert, Oriented, Affect-appropriate, Distress- none acute, +obese Skin- rash-none, lesions- none, excoriation- none Lymphadenopathy- none Head- atraumatic            Eyes- Gross vision intact, PERRLA, conjunctivae clear secretions            Ears- Hearing, canals-normal            Nose- Clear, no-Septal dev, mucus, polyps, erosion, perforation.              Throat- Mallampati IV , mucosa clear , drainage- none, tonsils- atrophic,  + remains hoarse, without stridor              Neck- flexible , trachea midline, no stridor , thyroid nl, carotid no bruit Chest - symmetrical excursion , unlabored           Heart/CV- RRR , no murmur , no gallop  , no rub, nl s1 s2                           -  JVD- none , edema- none, stasis changes- none, varices- none           Lung- clear to P&A, wheeze- none, cough+dry , dullness-none, rub- none           Chest wall-  Abd- Br/ Gen/ Rectal- Not done, not indicated Extrem- cyanosis- none, clubbing, none, atrophy- none, strength- nl.  Neuro- grossly intact to observation

## 2019-10-05 NOTE — Patient Instructions (Addendum)
We can continue CPAP 11  Script sent refilling cough syrup  Sample x 2 Breztri    Inhale 2 puffs, then rinse mouth, twice daily. Try this instead of Dulera. When the samples run out, go back to [facility] for comparison. If the Markus Daft is better, let us know and we can send a prescription.   Please call if we can help.

## 2019-10-05 NOTE — Assessment & Plan Note (Signed)
Chronic cough related to chronic LPR from vocal cord paresis/ splint.  Copes fairly well. Smart Vest helps. Plan- try samples Breztri, refill cough syrup.

## 2019-11-07 ENCOUNTER — Telehealth: Payer: Self-pay | Admitting: Internal Medicine

## 2019-11-07 DIAGNOSIS — J45909 Unspecified asthma, uncomplicated: Secondary | ICD-10-CM

## 2019-11-07 DIAGNOSIS — G4733 Obstructive sleep apnea (adult) (pediatric): Secondary | ICD-10-CM

## 2019-11-07 MED ORDER — PROMETHAZINE-CODEINE 6.25-10 MG/5ML PO SYRP: 5.0000 mL | ORAL_SOLUTION | Freq: Four times a day (QID) | ORAL | 0 refills | Status: DC | PRN

## 2019-11-07 NOTE — Telephone Encounter (Signed)
Pt states that cough med is Promethazine-DM 10MG /5MG 

## 2019-11-07 NOTE — Telephone Encounter (Signed)
Spoke with the pt  She states still has the same cough as ov 10/05/19- wants refill prometh/codeine syrup  Last given #240 ml on 10/05/19- [facility], wants to use same pharm this time She also wants order for chin strap for CPAP sent to [facility]  Please advise thanks  Next ov 04/08/20

## 2019-11-07 NOTE — Telephone Encounter (Signed)
Cough syrup order has been e-sent.  Please order to her DME- chin strap    For dx OSA

## 2019-11-07 NOTE — Telephone Encounter (Signed)
Called and left message for patient to return call. What cough medicine does she need?

## 2019-11-08 NOTE — Addendum Note (Signed)
Addended by: Wyvonne Lenz on: 11/08/2019 10:01 AM   Modules accepted: Orders

## 2019-11-08 NOTE — Telephone Encounter (Signed)
Rx for chinstrap has been sent to DME [facility] for pt. Attempted to call pt to let her know that CY sent Rx for cough syrup to pharmacy for her and that we have placed Rx for chinstrap but unable to reach. Left pt a detailed message that this had all been done for her. Nothing further needed.

## 2019-12-25 ENCOUNTER — Telehealth: Payer: Self-pay | Admitting: Internal Medicine

## 2019-12-25 NOTE — Telephone Encounter (Signed)
Called and spoke with Dominique Robbins at [facility] and asked for an update about patient getting a chin strap for her CPAP that was ordered in June. She stated she could see the order and that she was going to send over a message to that department and ask them to call the patient and give her any updates. Called and spoke with the patient to let her know the above and that she should be hearing from them. If she did not hear from them by the end of the week to call and let us know so we can call them again since she has been waiting for 3 months. Patient expressed understanding. Nothing further needed at this time.

## 2020-01-02 ENCOUNTER — Telehealth: Payer: Self-pay | Admitting: Internal Medicine

## 2020-01-02 DIAGNOSIS — J45909 Unspecified asthma, uncomplicated: Secondary | ICD-10-CM

## 2020-01-02 MED ORDER — PROMETHAZINE-CODEINE 6.25-10 MG/5ML PO SYRP: 5.0000 mL | ORAL_SOLUTION | Freq: Four times a day (QID) | ORAL | 0 refills | Status: DC | PRN

## 2020-01-02 NOTE — Telephone Encounter (Signed)
Cough syrup refilled 

## 2020-01-02 NOTE — Telephone Encounter (Signed)
Spoke with patient, she states that she has the same cough, not productive and denies any wheezing or fever.  She is requesting a refill of her Hycodan cough medication. Dr. Maple Hudson Please advise.  Thank you.

## 2020-01-03 NOTE — Telephone Encounter (Signed)
Called and spoke with pt letting her know that [facility] refilled her med for her and she verbalized understanding. Nothing further needed. °

## 2020-01-03 NOTE — Telephone Encounter (Signed)
Left message for patient

## 2020-03-26 ENCOUNTER — Telehealth: Payer: Self-pay | Admitting: Internal Medicine

## 2020-03-26 DIAGNOSIS — J45909 Unspecified asthma, uncomplicated: Secondary | ICD-10-CM

## 2020-03-26 MED ORDER — PROMETHAZINE-CODEINE 6.25-10 MG/5ML PO SYRP: 5.0000 mL | ORAL_SOLUTION | Freq: Four times a day (QID) | ORAL | 0 refills | Status: DC | PRN

## 2020-03-26 NOTE — Telephone Encounter (Signed)
Cough syrup refilled 

## 2020-03-26 NOTE — Telephone Encounter (Signed)
Called and spoke with pt letting her know [facility] refilled her med for her and she verbalized understanding. Nothing further needed.

## 2020-03-26 NOTE — Telephone Encounter (Signed)
Spoke to patient, who is requesting a refill on phenergan-codeine 6.25. last refilled on 01/02/2020 #251ml with 0 refills.  Last OV 10/05/19 with pending OV for 04/08/20. Preferred pharmacy is [facility] in [place].  Dr. Maple Hudson, please advise. thanks

## 2020-03-26 NOTE — Telephone Encounter (Signed)
Lm for patient.  

## 2020-04-07 NOTE — Progress Notes (Deleted)
Patient ID: Dominique Robbins, female    DOB: [birth year], 69 y.o.   MRN: [id number]  HPI 10/16/10- 76 yo never smoker followed for asthma/ bronchiectasis, lung nodules, allergic rhinitis, OSA, vocal cord paresis/ hoarseness. PFT: 07/10/2015-mild obstructive airways disease with minimal/insignificant response to bronchodilator, diffusion mildly reduced. FVC 2.98/82%, FEV1 2.32/83%, FEV1/FVC 0.78, TLC 83%, DLCO 66% NPSG 04/06/2000-AHI 33/hour, desaturation to 81% Home Sleep Test 09/19/15-AHI 37.5/hour, desaturation to 84%, body weight 273 pounds.  ---------------------------------------------------------------   10/05/19- 69 year old female never smoker for cough, allergic rhinitis, vocal cord paralysis/hoarseness / stents/ LPR, lung nodules, OSA  -----f/u Asthma with bronchitis/OSA Smart Vest, pro-air HFA, Dulera 200, Spiriva HandiHaler, neb albuterol/ ipratropium, Qnasl, CPAP 11/ [employer] Download compliance 100%, AHI 1.2/hr Had 2 Phizer Covax Body weight today 223 lbs Remains comfortable with CPAP- working well. Chronic dry cough related to LPR. Cough syrup very helpful. Asks about CXR f/u hx of lung nodule. Smart Vest helps when needed.   04/08/20-  69 year old female never smoker for cough, allergic rhinitis, vocal cord paralysis/hoarseness / stents/ LPR, lung nodules, OSA Smart Vest, pro-air HFA, Dulera 200, Spiriva HandiHaler, neb albuterol/ ipratropium, Qnasl, CPAP 11/ [employer] Download- Body weight today- Covid vax- Flu vax-   Review of Systems- see HPI + = positive Constitutional:   No-   weight loss, night sweats, fevers, chills, fatigue, lassitude. HEENT:   No-  headaches, difficulty swallowing, tooth/dental problems, sore throat,       No-  sneezing, itching, ear ache, nasal congestion, post nasal drip,  CV:  No-   chest pain, orthopnea, PND, swelling in lower extremities, anasarca, dizziness, palpitations Resp: +shortness of breath with exertion or at rest.                 productive cough,  + non-productive cough,  No- coughing up of blood.              No-   change in color of mucus.  + wheezing.   Skin: No-   rash or lesions. GI:  No-   heartburn, indigestion, abdominal pain, nausea, vomiting,  GU MS:  Neuro-     nothing unusual Psych:  No- change in mood or affect. No depression or anxiety.  No memory loss.    Objective:   Physical Exam General- Alert, Oriented, Affect-appropriate, Distress- none acute, +obese Skin- rash-none, lesions- none, excoriation- none Lymphadenopathy- none Head- atraumatic            Eyes- Gross vision intact, PERRLA, conjunctivae clear secretions            Ears- Hearing, canals-normal            Nose- Clear, no-Septal dev, mucus, polyps, erosion, perforation.              Throat- Mallampati IV , mucosa clear , drainage- none, tonsils- atrophic,  + remains hoarse, without stridor              Neck- flexible , trachea midline, no stridor , thyroid nl, carotid no bruit Chest - symmetrical excursion , unlabored           Heart/CV- RRR , no murmur , no gallop  , no rub, nl s1 s2                           - JVD- none , edema- none, stasis changes- none, varices- none           Lung- clear  to P&A, wheeze- none, cough+dry , dullness-none, rub- none           Chest wall-  Abd- Br/ Gen/ Rectal- Not done, not indicated Extrem- cyanosis- none, clubbing, none, atrophy- none, strength- nl.  Neuro- grossly intact to observation

## 2020-04-08 ENCOUNTER — Ambulatory Visit: Payer: Medicare Other | Admitting: Internal Medicine

## 2020-04-09 ENCOUNTER — Encounter: Payer: Self-pay | Admitting: Emergency Medicine

## 2020-04-09 ENCOUNTER — Emergency Department (INDEPENDENT_AMBULATORY_CARE_PROVIDER_SITE_OTHER)
Admission: EM | Admit: 2020-04-09 | Discharge: 2020-04-09 | Disposition: A | Discharge status: Home / Self Care | Payer: Medicare Other | Source: Home / Self Care

## 2020-04-09 ENCOUNTER — Other Ambulatory Visit: Payer: Self-pay

## 2020-04-09 DIAGNOSIS — R1033 Periumbilical pain: Secondary | ICD-10-CM | POA: Diagnosis not present

## 2020-04-09 DIAGNOSIS — R112 Nausea with vomiting, unspecified: Secondary | ICD-10-CM

## 2020-04-09 LAB — POCT CBC W AUTO DIFF (K'VILLE URGENT CARE)

## 2020-04-09 LAB — POCT FASTING CBG KUC MANUAL ENTRY: POCT Glucose (KUC): 181 mg/dL — AB (ref 70–99)

## 2020-04-09 MED ORDER — ONDANSETRON HCL 4 MG/2ML IJ SOLN
4.0000 mg | Freq: Once | INTRAMUSCULAR | Status: AC
  Administered 2020-04-09: 4 mg via INTRAMUSCULAR

## 2020-04-09 NOTE — ED Triage Notes (Signed)
Nausea, vomiting x 3 days Vaccinated

## 2020-04-09 NOTE — ED Provider Notes (Signed)
[facility]    CSN: [id number] Arrival date & time: 04/09/20  [id number]      History   Chief Complaint Chief Complaint  Patient presents with  . Emesis    HPI Dominique Robbins is a 69 y.o. female.   This is the initial [place] urgent care visit for this 69 year old woman who has been experiencing nausea and vomiting for the last 3 days.  Patient has type 2 diabetes.  Patient says she not been able to keep anything down.  She was awake all night.  She is not passing gas or having bowel movements.  She notes continuous pain with fluctuating intensity in the midline.  Patient has a remote history of similar symptoms.       Past Medical History:  Diagnosis Date  . Allergic rhinitis, cause unspecified   . Arthritis   . Bronchitis, asthmatic   . Chronic bronchitis (HCC)   . COPD (chronic obstructive pulmonary disease) (HCC)    "uncertain of having this"  . Diabetes mellitus (HCC)   . GERD (gastroesophageal reflux disease)   . Headache   . HTN (hypertension)   . Hyperlipidemia   . OSA on CPAP   . Pneumonia    hx of years ago  . Vocal cord paralysis     Patient Active Problem List   Diagnosis Date Noted  . Bronchiectasis without acute exacerbation (HCC) 06/09/2014  . Lung nodule < 6cm on CT 06/09/2014  . S/P left TKA 04/23/2014  . Preop cardiovascular exam 04/05/2014  . Asthma with bronchitis 06/13/2008  . Obstructive sleep apnea 08/06/2007  . Morbid obesity (HCC) 06/17/2007  . Seasonal and perennial allergic rhinitis 06/17/2007  . Disorder of vocal cord 06/17/2007    Past Surgical History:  Procedure Laterality Date  . APPENDECTOMY  69 years old  . CATARACT EXTRACTION, BILATERAL    . CESAREAN SECTION  1980  . CHOLECYSTECTOMY  at least 15 years ago  . KNEE ARTHROSCOPY Left   . ROTATOR CUFF REPAIR Right   . TOTAL KNEE ARTHROPLASTY Left 04/23/2014   Procedure: LEFT TOTAL KNEE ARTHROPLASTY;  Surgeon: Shelda PalMatthew D Olin, MD;  Location: [facility];   Service: Orthopedics;  Laterality: Left;  . vocal cord stent  2004    OB History   No obstetric history on file.      Home Medications    Prior to Admission medications   Medication Sig Start Date End Date Taking? Authorizing Provider  albuterol (PROAIR HFA) 108 (90 Base) MCG/ACT inhaler Inhale 1-2 puffs into the lungs every 4 (four) hours as needed for wheezing or shortness of breath. 04/09/18   Leslye PeerByrum, Robert S, MD  albuterol (PROVENTIL) (2.5 MG/3ML) 0.083% nebulizer solution Take 3 mLs (2.5 mg total) by nebulization every 4 (four) hours as needed for wheezing or shortness of breath. 04/09/18   Leslye PeerByrum, Robert S, MD  azelastine (ASTELIN) 137 MCG/SPRAY nasal spray Place 1 spray into the nose 2 (two) times daily. Use in each nostril as directed    [provider]  Beclomethasone Dipropionate (QNASL) 80 MCG/ACT AERS Place 1 spray into the nose daily. Patient taking differently: Place 1 spray into the nose every morning.  11/10/12   Jetty DuhamelYoung, Clinton D, MD  Budeson-Glycopyrrol-Formoterol (BREZTRI AEROSPHERE) 160-9-4.8 MCG/ACT AERO Inhale 2 puffs into the lungs in the morning and at bedtime. 10/05/19   Waymon BudgeYoung, Clinton D, MD  busPIRone (BUSPAR) 15 MG tablet Take 1 tablet by mouth Twice daily. 04/23/12   [provider]  Butalbital-APAP-Caffeine 50-300-40 MG CAPS butalbital-acetaminophen-caffeine 50 mg-300 mg-40 mg capsule    [provider]  carbamazepine (TEGRETOL) 200 MG tablet Take 200 mg by mouth 2 (two) times daily.    [provider]  Ciclesonide (ZETONNA) 37 MCG/ACT AERS 2 sprays daily.    [provider]  clotrimazole (LOTRIMIN) 1 % cream APPLY CREAM TOPICALLY TO AFFECTED AREA TWICE DAILY AS NEEDED 11/11/17   [provider]  CRESTOR 20 MG tablet Take 20 mg by mouth every morning.  03/20/11   [provider]  cyanocobalamin (,VITAMIN B-12,) 1000 MCG/ML injection Inject 1,000 mcg into the muscle every 30 (thirty) days. Mid-month     [provider]  cycloSPORINE (RESTASIS) 0.05 % ophthalmic emulsion Restasis 0.05 % eye drops in a dropperette    [provider]  diazepam (VALIUM) 5 MG tablet Take 5 mg by mouth at bedtime as needed for sedation.     [provider]  diclofenac (VOLTAREN) 75 MG EC tablet diclofenac sodium 75 mg tablet,delayed release    [provider]  dicyclomine (BENTYL) 10 MG capsule  12/19/17   [provider]  DULoxetine (CYMBALTA) 30 MG capsule  05/22/19   [provider]  ergocalciferol (VITAMIN D2) 50000 units capsule once a week.    [provider]  fluconazole (DIFLUCAN) 150 MG tablet fluconazole 150 mg tablet    [provider]  fluticasone (FLONASE) 50 MCG/ACT nasal spray Place 1-2 sprays into the nose at bedtime. 12/22/12   Jetty Duhamel D, MD  gabapentin (NEURONTIN) 300 MG capsule Take 600 mg by mouth 2 (two) times daily.  04/29/11   [provider]  glimepiride (AMARYL) 2 MG tablet Take 2 mg by mouth daily before breakfast.    [provider]  hydrocortisone 2.5 % cream hydrocortisone 2.5 % topical cream    [provider]  hydrOXYzine (ATARAX/VISTARIL) 25 MG tablet hydroxyzine HCl 25 mg tablet    [provider]  hyoscyamine (LEVSIN, ANASPAZ) 0.125 MG tablet Take 0.125 mg by mouth every 6 (six) hours as needed.    [provider]  ipratropium (ATROVENT) 0.06 % nasal spray ipratropium bromide 42 mcg (0.06 %) nasal spray    [provider]  levalbuterol (XOPENEX HFA) 45 MCG/ACT inhaler Inhale 1-2 puffs into the lungs every 6 (six) hours as needed for wheezing or shortness of breath. 04/04/15   Waymon Budge, MD  loratadine (CLARITIN) 10 MG tablet Take 10 mg by mouth every morning.     [provider]  LORazepam (ATIVAN) 1 MG tablet lorazepam 1 mg tablet    [provider]  losartan-hydrochlorothiazide (HYZAAR) 100-25 MG per tablet Take 1 tablet by mouth  daily.    [provider]  meclizine (ANTIVERT) 25 MG tablet Take 25 mg by mouth 3 (three) times daily as needed for dizziness.    [provider]  meloxicam (MOBIC) 15 MG tablet meloxicam 15 mg tablet    [provider]  metFORMIN (GLUCOPHAGE) 1000 MG tablet Take 1,000 mg by mouth 2 (two) times daily.    [provider]  mometasone-formoterol (DULERA) 200-5 MCG/ACT AERO Inhale 2 puffs into the lungs 2 (two) times daily. 04/04/15   Jetty Duhamel D, MD  montelukast (SINGULAIR) 10 MG tablet Take 1 tablet (10 mg total) by mouth daily. Patient taking differently: Take 10 mg by mouth every morning.  05/08/13   Jetty Duhamel D, MD  moxifloxacin (VIGAMOX) 0.5 % ophthalmic solution Vigamox 0.5 % eye drops  [provider]  NEXIUM 40 MG capsule Take 1 capsule by mouth every morning.  11/08/12   [provider]  ondansetron (ZOFRAN-ODT) 4 MG disintegrating tablet ondansetron 4 mg disintegrating tablet    [provider]  oxybutynin (DITROPAN) 5 MG tablet oxybutynin chloride 5 mg tablet    [provider]  pantoprazole (PROTONIX) 40 MG tablet pantoprazole 40 mg tablet,delayed release    [provider]  polyvinyl alcohol (LIQUIFILM TEARS) 1.4 % ophthalmic solution Place 1 drop into both eyes 2 (two) times daily as needed for dry eyes.    [provider]  Potassium Chloride ER 20 MEQ TBCR potassium chloride ER 20 mEq tablet,extended release    [provider]  PRESCRIPTION MEDICATION Inject 1 each into the skin every 14 (fourteen) days. Allergy shot.    [provider]  promethazine-codeine (PHENERGAN WITH CODEINE) 6.25-10 MG/5ML syrup Take 5 mLs by mouth every 6 (six) hours as needed for cough. 03/26/20   Waymon Budge, MD  Respiratory Therapy Supplies (FLUTTER) DEVI Blow through 4 times, repeat three times daily 06/07/14   Jetty Duhamel D, MD  sertraline (ZOLOFT) 100 MG tablet Take 200 mg by mouth  every morning.     [provider]  tamsulosin (FLOMAX) 0.4 MG CAPS capsule tamsulosin 0.4 mg capsule    [provider]  tiotropium (SPIRIVA) 18 MCG inhalation capsule Place 1 capsule (18 mcg total) into inhaler and inhale daily. 01/28/15   Waymon Budge, MD  tiZANidine (ZANAFLEX) 4 MG tablet Take 1 tablet (4 mg total) by mouth every 6 (six) hours as needed for muscle spasms. 04/24/14   Lanney Gins, PA-C  Vitamin D, Ergocalciferol, (DRISDOL) 50000 UNITS CAPS Take 1 capsule by mouth once a week. Monday 10/27/12   [provider]    Family History Family History  Problem Relation Age of Onset  . Heart disease Mother 43       CABG  . Diabetes Mother   . Heart disease Father 44    Social History Social History   Tobacco Use  . Smoking status: Never Smoker  . Smokeless tobacco: Never Used  Substance Use Topics  . Alcohol use: No  . Drug use: No     Allergies   Patient has no known allergies.   Review of Systems Review of Systems  Constitutional: Positive for activity change, appetite change and fatigue.  Gastrointestinal: Positive for abdominal distention, abdominal pain, nausea and vomiting. Negative for diarrhea.  Endocrine: Positive for polydipsia.  Genitourinary: Negative.      Physical Exam Triage Vital Signs ED Triage Vitals  Enc Vitals Group     BP 04/09/20 0845 139/81     Pulse Rate 04/09/20 0845 100     Resp 04/09/20 0845 20     Temp 04/09/20 0845 98.6 F (37 C)     Temp Source 04/09/20 0845 Oral     SpO2 04/09/20 0845 95 %     Weight 04/09/20 0846 230 lb (104.3 kg)     Height 04/09/20 0846 5\' 8"  (1.727 m)     Head Circumference --      Peak Flow --      Pain Score 04/09/20 0846 10     Pain Loc --      Pain Edu? --      Excl. in GC? --    No data found.  Updated Vital Signs BP 139/81 (BP Location: Right Arm)   Pulse 100   Temp  98.6 F (37 C) (Oral)   Resp 20   Ht 5\' 8"  (1.727 m)   Wt 104.3 kg   SpO2 95%   BMI  34.97 kg/m    Physical Exam Vitals and nursing note reviewed.  Constitutional:      General: She is not in acute distress.    Appearance: She is obese. She is not ill-appearing.  HENT:     Head: Normocephalic.     Mouth/Throat:     Mouth: Mucous membranes are dry.  Eyes:     Conjunctiva/sclera: Conjunctivae normal.  Cardiovascular:     Rate and Rhythm: Tachycardia present.     Heart sounds: Normal heart sounds.  Pulmonary:     Effort: Pulmonary effort is normal.     Breath sounds: Normal breath sounds.  Abdominal:     General: There is distension.     Palpations: There is no mass.     Tenderness: There is abdominal tenderness. There is guarding. There is no rebound.  Musculoskeletal:        General: Normal range of motion.     Cervical back: Normal range of motion and neck supple.  Skin:    General: Skin is warm and dry.  Neurological:     General: No focal deficit present.     Mental Status: She is alert and oriented to person, place, and time.  Psychiatric:        Thought Content: Thought content normal.        Judgment: Judgment normal.      UC Treatments / Results  Labs (all labs ordered are listed, but only abnormal results are displayed) Labs Reviewed  POCT FASTING CBG [facility] MANUAL ENTRY - Abnormal; Notable for the following components:      Result Value   POCT Glucose ([facility]) 181 (*)    All other components within normal limits  POCT CBC W AUTO DIFF ([facility])    EKG   Radiology No results found.  Procedures Procedures (including critical care time)  Medications Ordered in UC Medications  ondansetron (ZOFRAN) injection 4 mg (4 mg Intramuscular Given 04/09/20 0912)    Initial Impression / Assessment and Plan / UC Course  I have reviewed the triage vital signs and the nursing notes.  Pertinent labs & imaging results that were available during my care of the patient were reviewed by me and considered in my medical decision making (see  chart for details).    Final Clinical Impressions(s) / UC Diagnoses   Final diagnoses:  Periumbilical abdominal pain  Intractable vomiting with nausea, unspecified vomiting type     Discharge Instructions     Your laboratory results and symptoms suggest that you may be developing a significant obstruction.  You are also mildly dehydrated and need fluid resuscitation.  For these reasons, I am recommending that you go to the emergency room for further evaluation and treatment.  Enclosed are your laboratory reports from this morning.    ED Prescriptions    None     I have reviewed the PDMP during this encounter.   04/11/20, MD 04/09/20 [id number]

## 2020-04-09 NOTE — Discharge Instructions (Addendum)
Your laboratory results and symptoms suggest that you may be developing a significant obstruction.  You are also mildly dehydrated and need fluid resuscitation.  For these reasons, I am recommending that you go to the emergency room for further evaluation and treatment.  Enclosed are your laboratory reports from this morning.

## 2020-06-10 NOTE — Progress Notes (Signed)
Patient ID: Dominique Robbins, female    DOB: 16-Apr-1951, 70 y.o.   MRN: [id number]  HPI 10/16/10- 20 yo never smoker followed for asthma/ bronchiectasis, lung nodules, allergic rhinitis, OSA, vocal cord paresis/ hoarseness. PFT: 07/10/2015-mild obstructive airways disease with minimal/insignificant response to bronchodilator, diffusion mildly reduced. FVC 2.98/82%, FEV1 2.32/83%, FEV1/FVC 0.78, TLC 83%, DLCO 66% NPSG 04/06/2000-AHI 33/hour, desaturation to 81% Home Sleep Test 09/19/15-AHI 37.5/hour, desaturation to 84%, body weight 273 pounds.  ---------------------------------------------------------------   10/05/19- 70 year old female never smoker for cough, allergic rhinitis, vocal cord paralysis/hoarseness / stents/ LPR, lung nodules, OSA  -----f/u Asthma with bronchitis/OSA Smart Vest, pro-air HFA, Dulera 200, Spiriva HandiHaler, neb albuterol/ ipratropium, Qnasl, CPAP 11/ [employer] Download compliance 100%, AHI 1.2/hr Had 2 Phizer Covax Body weight today 223 lbs Remains comfortable with CPAP- working well. Chronic dry cough related to LPR. Cough syrup very helpful. Asks about CXR f/u hx of lung nodule. Smart Vest helps when needed.   06/11/20- 70 year old female never smoker for Cough, allergic Rhinitis, Vocal Cord paralysis/hoarseness / Stents/ LPR, lung nodules, OSA, Asthmatic Bronchitis, Complicated by DM2,HTN, Hyperlipidemia, GERD,  -Smart Vest, pro-air HFA/ Xopenex hfa, Dulera 200, Spiriva HandiHaler, neb albuterol/ ipratropium, Qnasl, Prometh -codeine cough syrup, Breztri,  CPAP 11/ [employer] Download- compliance 100%, AHI 1.2/ hr Body weight today- 234 lbs Covid vax- 3 Phizer Flu vax-had -----Needs refill on cough syrup, states that her breathing is the same since last visit. She feels a little out of breath this morning.  ACT 16 She is satisfied with CPAP and doing well. Download reviewed. She liked Breztri. Discussed ability, using this, to remove other meds from her  list. Always has some cough and asks refill cough syrup. 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  Review of Systems- see HPI + = positive Constitutional:   No-   weight loss, night sweats, fevers, chills, fatigue, lassitude. HEENT:   No-  headaches, difficulty swallowing, tooth/dental problems, sore throat,       No-  sneezing, itching, ear ache, nasal congestion, post nasal drip,  CV:  No-   chest pain, orthopnea, PND, swelling in lower extremities, anasarca, dizziness, palpitations Resp: +shortness of breath with exertion or at rest.                productive cough,  + non-productive cough,  No- coughing up of blood.              No-   change in color of mucus.  + wheezing.   Skin: No-   rash or lesions. GI:  No-   heartburn, indigestion, abdominal pain, nausea, vomiting,  GU MS:  Neuro-     nothing unusual Psych:  No- change in mood or affect. No depression or anxiety.  No memory loss.    Objective:   Physical Exam General- Alert, Oriented, Affect-appropriate, Distress- none acute, +obese Skin- rash-none, lesions- none, excoriation- none Lymphadenopathy- none Head- atraumatic            Eyes- Gross vision intact, PERRLA, conjunctivae clear secretions            Ears- Hearing, canals-normal            Nose- Clear, no-Septal dev, mucus, polyps, erosion, perforation.              Throat- Mallampati IV , mucosa clear , drainage- none, tonsils- atrophic,  + remains hoarse, without stridor  Neck- flexible , trachea midline, no stridor , thyroid nl, carotid no bruit Chest - symmetrical excursion , unlabored           Heart/CV- RRR , no murmur , no gallop  , no rub, nl s1 s2                           - JVD- none , edema- none, stasis changes- none, varices- none           Lung- clear to P&A, wheeze- none, cough+dry , dullness-none, rub- none           Chest wall-  Abd- Br/ Gen/ Rectal- Not done, not indicated Extrem- cyanosis- none, clubbing,  none, atrophy- none, strength- nl. + cane Neuro- grossly intact to observation

## 2020-06-11 ENCOUNTER — Other Ambulatory Visit: Payer: Self-pay

## 2020-06-11 ENCOUNTER — Telehealth: Payer: Self-pay | Admitting: Internal Medicine

## 2020-06-11 ENCOUNTER — Ambulatory Visit (INDEPENDENT_AMBULATORY_CARE_PROVIDER_SITE_OTHER): Payer: Medicare Other | Admitting: Internal Medicine

## 2020-06-11 ENCOUNTER — Encounter: Payer: Self-pay | Admitting: Internal Medicine

## 2020-06-11 ENCOUNTER — Ambulatory Visit (INDEPENDENT_AMBULATORY_CARE_PROVIDER_SITE_OTHER): Payer: Medicare Other

## 2020-06-11 VITALS — BP 90/40 | HR 60 | Temp 97.5°F | Ht 68.0 in | Wt 234.2 lb

## 2020-06-11 DIAGNOSIS — R911 Solitary pulmonary nodule: Secondary | ICD-10-CM

## 2020-06-11 DIAGNOSIS — G4733 Obstructive sleep apnea (adult) (pediatric): Secondary | ICD-10-CM | POA: Diagnosis not present

## 2020-06-11 DIAGNOSIS — IMO0001 Reserved for inherently not codable concepts without codable children: Secondary | ICD-10-CM

## 2020-06-11 DIAGNOSIS — J45909 Unspecified asthma, uncomplicated: Secondary | ICD-10-CM | POA: Diagnosis not present

## 2020-06-11 DIAGNOSIS — J42 Unspecified chronic bronchitis: Secondary | ICD-10-CM | POA: Diagnosis not present

## 2020-06-11 DIAGNOSIS — J479 Bronchiectasis, uncomplicated: Secondary | ICD-10-CM

## 2020-06-11 MED ORDER — PROMETHAZINE-CODEINE 6.25-10 MG/5ML PO SYRP: 5.0000 mL | ORAL_SOLUTION | Freq: Four times a day (QID) | ORAL | 0 refills | Status: DC | PRN

## 2020-06-11 MED ORDER — BREZTRI AEROSPHERE 160-9-4.8 MCG/ACT IN AERO: 2.0000 | INHALATION_SPRAY | Freq: Two times a day (BID) | RESPIRATORY_TRACT | 12 refills | Status: DC

## 2020-06-11 NOTE — Assessment & Plan Note (Signed)
Plan- continue reflux and swallowing precautions. Continue Breztri, removing Spiriva, Dulera, Alvesco, and xopenex hfa.

## 2020-06-11 NOTE — Assessment & Plan Note (Signed)
Benefits from CPAP with good compliance and control. Plan-continue CPAP 11 

## 2020-06-11 NOTE — Telephone Encounter (Signed)
LMTCB

## 2020-06-11 NOTE — Patient Instructions (Addendum)
Refill for cough syrup sent to [facility].  Script for [employer] sent to [facility]  We have removed Dulera, Xopenex hfa rescue inhaler, Alvesco (ciclesonide) and Spiriva from your list.  We can continue CPAP 11  Please call if we can help  Order- CXR   Dx chronic bronchitis, hx lung nodule

## 2020-06-11 NOTE — Progress Notes (Signed)
LMTCB

## 2020-06-12 ENCOUNTER — Telehealth: Payer: Self-pay | Admitting: Internal Medicine

## 2020-06-12 DIAGNOSIS — J45909 Unspecified asthma, uncomplicated: Secondary | ICD-10-CM

## 2020-06-12 MED ORDER — TRELEGY ELLIPTA 100-62.5-25 MCG/INH IN AEPB: 1.0000 | INHALATION_SPRAY | Freq: Every day | RESPIRATORY_TRACT | 11 refills | Status: DC

## 2020-06-12 NOTE — Telephone Encounter (Signed)
Spoke with the pt She states that she tried to get the rx filled for [facility] and it was going to cost her over $400  She is asking if she can go back to spiriva or dulera  Please advise, thanks!

## 2020-06-12 NOTE — Telephone Encounter (Signed)
Patient is returning phone call. Patient phone number is [phone].

## 2020-06-12 NOTE — Progress Notes (Signed)
Spoke with pt and notified of results per Dr. Young Pt verbalized understanding and denied any questions. 

## 2020-06-12 NOTE — Telephone Encounter (Signed)
I spoke with the pt and notified of response per Dr Maple Hudson  She is okay with trial of trelegy and will call for spiriva and symbicort rx if this is not affordable  Rx for trelegy was sent to pharm  Nothing further needed at this time

## 2020-06-12 NOTE — Telephone Encounter (Signed)
Pt stated that her pharmacy never did get the rx for the cough meds.  She stated that the trelegy is way too expensive and she is wanting to know if something else can be called in to replace the trelegy.  CY please advise. Thanks

## 2020-06-12 NOTE — Telephone Encounter (Signed)
Can we try replacing Breztri with Trelegy 100   # 1   inhale 1 puff, then rinse mouth, once daily   ref x 12 If that is also too expensive, then we will refill her Spiriva and change from [facility] (which we hear is no longer being made) to Symbicort 160.

## 2020-06-12 NOTE — Telephone Encounter (Signed)
Tried calling pt, NA LMTCB

## 2020-06-14 NOTE — Telephone Encounter (Signed)
Update please

## 2020-06-14 NOTE — Telephone Encounter (Signed)
lmtcb X1 for pt.   Attempted to call pharmacy to see if Trelegy is requiring a PA, but pharmacy is closed for lunch until 1400.  Wcb.

## 2020-06-14 NOTE — Telephone Encounter (Signed)
Spoke with pharmacy, states that copay for this med is $429/30 day supply and this does not require a prior authorization.  Pharmacy also verified that they have no recent rx for a cough syrup for patient.

## 2020-06-15 MED ORDER — PROMETHAZINE-CODEINE 6.25-10 MG/5ML PO SYRP: 5.0000 mL | ORAL_SOLUTION | Freq: Four times a day (QID) | ORAL | 0 refills | Status: DC | PRN

## 2020-06-15 MED ORDER — SPIRIVA RESPIMAT 2.5 MCG/ACT IN AERS: INHALATION_SPRAY | RESPIRATORY_TRACT | 12 refills | Status: DC

## 2020-06-15 MED ORDER — BUDESONIDE-FORMOTEROL FUMARATE 160-4.5 MCG/ACT IN AERO: INHALATION_SPRAY | RESPIRATORY_TRACT | 12 refills | Status: DC

## 2020-06-15 NOTE — Telephone Encounter (Signed)
I removed Breztri and Trelegy. Sent scripts for cough syrup, Symbicort (instead of Dulera) and Spiriva to her [facility] in [place].

## 2020-06-17 ENCOUNTER — Telehealth: Payer: Self-pay | Admitting: Internal Medicine

## 2020-06-17 DIAGNOSIS — J45909 Unspecified asthma, uncomplicated: Secondary | ICD-10-CM

## 2020-06-17 MED ORDER — PROMETHAZINE-CODEINE 6.25-10 MG/5ML PO SYRP: 5.0000 mL | ORAL_SOLUTION | Freq: Four times a day (QID) | ORAL | 0 refills | Status: DC | PRN

## 2020-06-17 NOTE — Telephone Encounter (Signed)
Attempted to call pt but unable to reach. Left detailed message for pt letting her know that CY sent refill of cough syrup to pharmacy for her. Nothing further needed.

## 2020-06-17 NOTE — Telephone Encounter (Signed)
Patient states needs cough syrup. Pharmacy states did not get RX for cough syrup. [facility]acy is [facility]. Patient phone number is [phone].

## 2020-06-17 NOTE — Telephone Encounter (Signed)
This did not go through to the pharmacy. Please send again

## 2020-06-17 NOTE — Telephone Encounter (Signed)
Prometh codeine cough syrup redirected to [facility]

## 2020-06-17 NOTE — Telephone Encounter (Signed)
I keep trying to send prometh-codeine cough syrup to her [facility] and getting an error message from [employer] that it won't go through. Can we try a different pharmacy?

## 2020-06-17 NOTE — Telephone Encounter (Signed)
Called and spoke with Patient.  Patient stated cough medication can be sent to [facility]. Pharmacy added to Patient's list.  Message routed to Dr Maple Hudson

## 2020-06-17 NOTE — Telephone Encounter (Signed)
ATC patient to check with her on her current prescriptions and to verify pharmacy. Received message that RX for cough syrup did not go through to pharmacy. Need to verify this with patient and also ask her if she got the Symbicort and Spiriva.  LMTCB      8:51 AM Note This did not go through to the pharmacy. Please send again     June 15, 2020      3:56 PM Waymon Budge, MD routed this conversation to [facility] Triage Wille Celeste, Rennis Chris, MD     3:56 PM Note I removed Breztri and Trelegy. Sent scripts for cough syrup, Symbicort (instead of Dulera) and Spiriva to her [facility] in [place].

## 2020-06-17 NOTE — Telephone Encounter (Signed)
Called and spoke with Patient.  Patient stated Dominique Robbins received Symbicort and Spiriva prescriptions, but did not receive any cough medication.   Message routed to Dr. Maple Hudson  No Known Allergies Current Outpatient Medications on File Prior to Visit  Medication Sig Dispense Refill  . albuterol (PROAIR HFA) 108 (90 Base) MCG/ACT inhaler Inhale 1-2 puffs into the lungs every 4 (four) hours as needed for wheezing or shortness of breath. 1 Inhaler 0  . albuterol (PROVENTIL) (2.5 MG/3ML) 0.083% nebulizer solution Take 3 mLs (2.5 mg total) by nebulization every 4 (four) hours as needed for wheezing or shortness of breath. 75 mL 0  . azelastine (ASTELIN) 137 MCG/SPRAY nasal spray Place 1 spray into the nose 2 (two) times daily. Use in each nostril as directed    . Beclomethasone Dipropionate (QNASL) 80 MCG/ACT AERS Place 1 spray into the nose daily. (Patient taking differently: Place 1 spray into the nose every morning.) 1 Inhaler prn  . budesonide-formoterol (SYMBICORT) 160-4.5 MCG/ACT inhaler Inhale 2 puffs then rinse mouth, twice daily 1 each 12  . busPIRone (BUSPAR) 15 MG tablet Take 1 tablet by mouth Twice daily.    . Butalbital-APAP-Caffeine 50-300-40 MG CAPS butalbital-acetaminophen-caffeine 50 mg-300 mg-40 mg capsule    . carbamazepine (TEGRETOL) 200 MG tablet Take 200 mg by mouth 2 (two) times daily.    . clotrimazole (LOTRIMIN) 1 % cream APPLY CREAM TOPICALLY TO AFFECTED AREA TWICE DAILY AS NEEDED  1  . CRESTOR 20 MG tablet Take 20 mg by mouth every morning.     . cyanocobalamin (,VITAMIN B-12,) 1000 MCG/ML injection Inject 1,000 mcg into the muscle every 30 (thirty) days. Mid-month    . cycloSPORINE (RESTASIS) 0.05 % ophthalmic emulsion Restasis 0.05 % eye drops in a dropperette    . diazepam (VALIUM) 5 MG tablet Take 5 mg by mouth at bedtime as needed for sedation.    . diclofenac (VOLTAREN) 75 MG EC tablet diclofenac sodium 75 mg tablet,delayed release    . dicyclomine (BENTYL)  10 MG capsule     . DULoxetine (CYMBALTA) 30 MG capsule     . ergocalciferol (VITAMIN D2) 50000 units capsule once a week.    . fluconazole (DIFLUCAN) 150 MG tablet fluconazole 150 mg tablet    . fluticasone (FLONASE) 50 MCG/ACT nasal spray Place 1-2 sprays into the nose at bedtime. 16 g 6  . gabapentin (NEURONTIN) 300 MG capsule Take 600 mg by mouth 2 (two) times daily.     Marland Kitchen glimepiride (AMARYL) 2 MG tablet Take 2 mg by mouth daily before breakfast.    . hydrocortisone 2.5 % cream hydrocortisone 2.5 % topical cream    . hydrOXYzine (ATARAX/VISTARIL) 25 MG tablet hydroxyzine HCl 25 mg tablet    . hyoscyamine (LEVSIN, ANASPAZ) 0.125 MG tablet Take 0.125 mg by mouth every 6 (six) hours as needed.    Marland Kitchen ipratropium (ATROVENT) 0.06 % nasal spray ipratropium bromide 42 mcg (0.06 %) nasal spray    . loratadine (CLARITIN) 10 MG tablet Take 10 mg by mouth every morning.    Marland Kitchen LORazepam (ATIVAN) 1 MG tablet lorazepam 1 mg tablet    . losartan-hydrochlorothiazide (HYZAAR) 100-25 MG per tablet Take 1 tablet by mouth daily.    . meclizine (ANTIVERT) 25 MG tablet Take 25 mg by mouth 3 (three) times daily as needed for dizziness.    . meloxicam (MOBIC) 15 MG tablet meloxicam 15 mg tablet    . metFORMIN (GLUCOPHAGE) 1000 MG tablet Take 1,000 mg by mouth  2 (two) times daily.    . montelukast (SINGULAIR) 10 MG tablet Take 1 tablet (10 mg total) by mouth daily. (Patient taking differently: Take 10 mg by mouth every morning.) 90 tablet 3  . moxifloxacin (VIGAMOX) 0.5 % ophthalmic solution Vigamox 0.5 % eye drops    . NEXIUM 40 MG capsule Take 1 capsule by mouth every morning.     . ondansetron (ZOFRAN-ODT) 4 MG disintegrating tablet ondansetron 4 mg disintegrating tablet    . oxybutynin (DITROPAN) 5 MG tablet oxybutynin chloride 5 mg tablet    . pantoprazole (PROTONIX) 40 MG tablet pantoprazole 40 mg tablet,delayed release    . polyvinyl alcohol (LIQUIFILM TEARS) 1.4 % ophthalmic solution Place 1 drop into both  eyes 2 (two) times daily as needed for dry eyes.    . Potassium Chloride ER 20 MEQ TBCR potassium chloride ER 20 mEq tablet,extended release    . PRESCRIPTION MEDICATION Inject 1 each into the skin every 14 (fourteen) days. Allergy shot.    . promethazine-codeine (PHENERGAN WITH CODEINE) 6.25-10 MG/5ML syrup Take 5 mLs by mouth every 6 (six) hours as needed for cough. 240 mL 0  . Respiratory Therapy Supplies (FLUTTER) DEVI Blow through 4 times, repeat three times daily 1 each 0  . sertraline (ZOLOFT) 100 MG tablet Take 200 mg by mouth every morning.    . tamsulosin (FLOMAX) 0.4 MG CAPS capsule tamsulosin 0.4 mg capsule    . tiotropium (SPIRIVA) 18 MCG inhalation capsule Place 1 capsule (18 mcg total) into inhaler and inhale daily. (Patient taking differently: Place 18 mcg into inhaler and inhale every morning. ) 90 capsule 3  . Tiotropium Bromide Monohydrate (SPIRIVA RESPIMAT) 2.5 MCG/ACT AERS Inhale 2 puffs daily 4 g 12  . tiZANidine (ZANAFLEX) 4 MG tablet Take 1 tablet (4 mg total) by mouth every 6 (six) hours as needed for muscle spasms. 40 tablet 0  . Vitamin D, Ergocalciferol, (DRISDOL) 50000 UNITS CAPS Take 1 capsule by mouth once a week. Monday     No current facility-administered medications on file prior to visit.

## 2020-06-20 ENCOUNTER — Telehealth: Payer: Self-pay | Admitting: Internal Medicine

## 2020-06-20 NOTE — Telephone Encounter (Signed)
Pt calling back to sate that the pharmacy had the medication and she has picked up the medication. Nothing further needed.

## 2020-06-20 NOTE — Telephone Encounter (Signed)
Patient called stated the pharmacy does not have that medication either. Patient is going to call the pharmacy and see what medication they do have that Dr. Maple Hudson can order.  Will wait for patient's phone call

## 2020-09-13 ENCOUNTER — Telehealth: Payer: Self-pay | Admitting: Internal Medicine

## 2020-09-13 DIAGNOSIS — J45909 Unspecified asthma, uncomplicated: Secondary | ICD-10-CM

## 2020-09-13 MED ORDER — PROMETHAZINE-CODEINE 6.25-10 MG/5ML PO SYRP: 5.0000 mL | ORAL_SOLUTION | Freq: Four times a day (QID) | ORAL | 0 refills | Status: DC | PRN

## 2020-09-13 NOTE — Telephone Encounter (Signed)
Lm for patient.  

## 2020-09-13 NOTE — Telephone Encounter (Signed)
I have been trying to refill her prometh -codeine cough syrup. I keep getting an error message saying the prescription failed to go through.

## 2020-09-13 NOTE — Telephone Encounter (Signed)
Patient is returning phone call. Patient phone number is [phone].

## 2020-09-13 NOTE — Addendum Note (Signed)
Addended by: Jetty Duhamel D on: 09/13/2020 03:36 PM   Modules accepted: Orders

## 2020-09-13 NOTE — Telephone Encounter (Signed)
Spoke with the pt  She is requesting refill on her prometh/codieine syrup  States she has "the same old cough I always have"- confirmed that pt is not having any acute symptoms  Looks like she had this filled last #240 ml 06-17-20 [facility]

## 2020-09-13 NOTE — Telephone Encounter (Signed)
Pt aware rx was sent.

## 2020-09-13 NOTE — Telephone Encounter (Addendum)
Tried to telephone med in to [facility]  They do not carry this drug any longer  Spoke with the pt  She wants it to go to [facility]  This is where she wanted it sent to begin with, and I must have misunderstood, apologies

## 2020-09-13 NOTE — Telephone Encounter (Signed)
Cough syrup refilled at her [facility] Thanks for your help bird dogging this.

## 2020-11-12 ENCOUNTER — Other Ambulatory Visit: Payer: Self-pay | Admitting: Internal Medicine

## 2020-11-12 DIAGNOSIS — J45909 Unspecified asthma, uncomplicated: Secondary | ICD-10-CM

## 2020-11-12 MED ORDER — PROMETHAZINE-CODEINE 6.25-10 MG/5ML PO SYRP: 5.0000 mL | ORAL_SOLUTION | Freq: Four times a day (QID) | ORAL | 0 refills | Status: DC | PRN

## 2020-11-12 NOTE — Telephone Encounter (Signed)
Patient requesting refill on Promethazine-codeine syrup. Last OV 06/11/20. Last refill sent in 09/13/20. Is it ok to fill?  Dr. Maple Hudson please advise.  [facility], [place].

## 2020-11-12 NOTE — Telephone Encounter (Signed)
Cough syrup refilled 

## 2020-11-13 ENCOUNTER — Telehealth: Payer: Self-pay | Admitting: Internal Medicine

## 2020-11-13 DIAGNOSIS — J45909 Unspecified asthma, uncomplicated: Secondary | ICD-10-CM

## 2020-11-13 MED ORDER — PROMETHAZINE-CODEINE 6.25-10 MG/5ML PO SYRP: 5.0000 mL | ORAL_SOLUTION | Freq: Four times a day (QID) | ORAL | 0 refills | Status: DC | PRN

## 2020-11-13 NOTE — Telephone Encounter (Signed)
Prescription for promethazine codeine cough syrup was redirected to [facility] as requested

## 2020-11-13 NOTE — Telephone Encounter (Signed)
Called and spoke with patient. She states that [facility] does not carry anything with Codeine in it now and they did not receive the fax.   Can you send it to [facility] in [place]   Thank you

## 2020-11-13 NOTE — Telephone Encounter (Signed)
Please see message from pharmacy  Interface, Surescripts Out  P Clint Young Rx Refill An error occurred while processing the e-prescribing message.   The message was not sent electronically to the requested pharmacy. Contact the pharmacy about the new prescription.   Code: 900 - Transaction rejected  Effective April 05, 2020, [facility] and [facility] no longer stocks this medication promethazine-codeine (PHENERGAN WITH CODEINE) 6.25-10 MG/5ML syrup.  Please contact your pharmacist to discuss alternatives.

## 2020-11-13 NOTE — Telephone Encounter (Signed)
Please let patient know that her pharmacy no longer stockes promethazine with codeine cough syrup. Maybe she can identify another pharmacy that does carry this.

## 2020-11-13 NOTE — Telephone Encounter (Signed)
Called and spoke with patient. Advised her that Dr. Maple Hudson is out of the office for a few days. Advised her to call [facility] and have them to transfer the prescription to [facility]. Patient states she will give them a call.  Nothing further needed at this time.

## 2020-11-14 NOTE — Telephone Encounter (Signed)
See other phone note from 11/13/20. Will close this encounter.

## 2020-11-14 NOTE — Telephone Encounter (Signed)
Called patient. Someone answered the phone but did not say anything. Will attempt to call back later.  

## 2020-11-14 NOTE — Telephone Encounter (Signed)
ATC pt. Unable to leave VM. WCB.  

## 2020-11-19 NOTE — Telephone Encounter (Signed)
Called and spoke to pt. Informed her of the script. Pt states she already picked it up. Nothing further needed at this time.

## 2020-12-08 NOTE — Progress Notes (Signed)
Patient ID: Dominique Robbins, female    DOB: [birth year], 70 y.o.   MRN: [id number]  HPI 10/16/10- 14 yo never smoker followed for asthma/ bronchiectasis, lung nodules, allergic rhinitis, OSA, vocal cord paresis/ hoarseness. PFT: 07/10/2015-mild obstructive airways disease with minimal/insignificant response to bronchodilator, diffusion mildly reduced. FVC 2.98/82%, FEV1 2.32/83%, FEV1/FVC 0.78, TLC 83%, DLCO 66% NPSG 04/06/2000-AHI 33/hour, desaturation to 81% Home Sleep Test 09/19/15-AHI 37.5/hour, desaturation to 84%, body weight 273 pounds.  ---------------------------------------------------------------    06/11/20- 70 year old female never smoker for Cough, allergic Rhinitis, Vocal Cord paralysis/hoarseness / Stents/ LPR, lung nodules, OSA, Asthmatic Bronchitis, Complicated by DM2,HTN, Hyperlipidemia, GERD,  -Smart Vest, pro-air HFA/ Xopenex hfa, Dulera 200, Spiriva HandiHaler, neb albuterol/ ipratropium, Qnasl, Prometh -codeine cough syrup, Breztri,  CPAP 11/ [facility] Download- compliance 100%, AHI 1.2/ hr Body weight today- 234 lbs Covid vax- 3 Phizer Flu vax-had -----Needs refill on cough syrup, states that her breathing is the same since last visit. She feels a little out of breath this morning.  ACT 16 She is satisfied with CPAP and doing well. Download reviewed. She liked Breztri. Discussed ability, using this, to remove other meds from her list. Always has some cough and asks refill cough syrup. 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  12/09/20- 70 year old female never smoker for Cough, allergic Rhinitis, Vocal Cord paralysis/hoarseness / Stents/ LPR, lung nodules, OSA, Asthmatic Bronchitis, Complicated by DM2,HTN, Hyperlipidemia, GERD,  -Smart Vest, pro-air HFA/ Xopenex hfa, Dulera 200, Spiriva HandiHaler, neb albuterol/ ipratropium, Qnasl, Prometh -codeine cough syrup,Trelegy  CPAP 11/ [facility] Download- N/A today Body weight today-232 lbs Covid  vax-4 Phizer -----Feels better since finishing Prednisone, SOB, fatigue  ACT score-12 Using CPAP every night and sleeps well with it. No changes. No download available today. Recent back surgery- recovering well. Stopped [employer], [employer] (consulting) for recovery, but it helps and she is resuming now. Still needs codeine cough syrup  Her allergist recently changed her from Breztri to Trelegy 200 and gave prednisone taper ending yesterday. Asks refill neb solution. CXR 06/11/20- IMPRESSION: No pulmonary nodule seen. Enlargement of cardiac silhouette. No acute abnormalities.   Review of Systems- see HPI + = positive Constitutional:   No-   weight loss, night sweats, fevers, chills, fatigue, lassitude. HEENT:   No-  headaches, difficulty swallowing, tooth/dental problems, sore throat,       No-  sneezing, itching, ear ache, nasal congestion, post nasal drip,  CV:  No-   chest pain, orthopnea, PND, swelling in lower extremities, anasarca, dizziness, palpitations Resp: +shortness of breath with exertion or at rest.                productive cough,  + non-productive cough,  No- coughing up of blood.              No-   change in color of mucus.  + wheezing.   Skin: No-   rash or lesions. GI:  No-   heartburn, indigestion, abdominal pain, nausea, vomiting,  GU MS:  Neuro-     nothing unusual Psych:  No- change in mood or affect. No depression or anxiety.  No memory loss.    Objective:   Physical Exam General- Alert, Oriented, Affect-appropriate, Distress- none acute, +obese Skin- rash-none, lesions- none, excoriation- none Lymphadenopathy- none Head- atraumatic            Eyes- Gross vision intact, PERRLA, conjunctivae clear secretions            Ears-  Hearing, canals-normal            Nose- Clear, no-Septal dev, mucus, polyps, erosion, perforation.              Throat- Mallampati IV , mucosa clear , drainage- none, tonsils- atrophic,  + remains hoarse, without stridor              Neck- flexible ,  trachea midline, no stridor , thyroid nl, carotid no bruit Chest - symmetrical excursion , unlabored           Heart/CV- RRR , no murmur , no gallop  , no rub, nl s1 s2                           - JVD- none , edema- none, stasis changes- none, varices- none           Lung- clear to P&A, wheeze- none, cough+dry , dullness-none, rub- none           Chest wall-  Abd- Br/ Gen/ Rectal- Not done, not indicated Extrem- cyanosis- none, clubbing, none, atrophy- none, strength- nl. + cane Neuro- grossly intact to observation

## 2020-12-09 ENCOUNTER — Ambulatory Visit (INDEPENDENT_AMBULATORY_CARE_PROVIDER_SITE_OTHER): Payer: Medicare Other | Admitting: Internal Medicine

## 2020-12-09 ENCOUNTER — Other Ambulatory Visit: Payer: Self-pay

## 2020-12-09 ENCOUNTER — Encounter: Payer: Self-pay | Admitting: Internal Medicine

## 2020-12-09 DIAGNOSIS — J45909 Unspecified asthma, uncomplicated: Secondary | ICD-10-CM | POA: Diagnosis not present

## 2020-12-09 DIAGNOSIS — G4733 Obstructive sleep apnea (adult) (pediatric): Secondary | ICD-10-CM

## 2020-12-09 MED ORDER — ALBUTEROL SULFATE (2.5 MG/3ML) 0.083% IN NEBU: 2.5000 mg | INHALATION_SOLUTION | RESPIRATORY_TRACT | 12 refills | Status: DC | PRN

## 2020-12-09 MED ORDER — PROMETHAZINE-CODEINE 6.25-10 MG/5ML PO SYRP: 5.0000 mL | ORAL_SOLUTION | Freq: Four times a day (QID) | ORAL | 0 refills | Status: DC | PRN

## 2020-12-09 NOTE — Patient Instructions (Signed)
Cough syrup refilled at [facility]  Continue your CPAP  Glad your back surgery is done and that you can get back to using the Smart Vest since it helps you.  Please call if we can help

## 2020-12-09 NOTE — Assessment & Plan Note (Signed)
Benefits from CPAP Plan- continue CPAP 11/ [facility]

## 2020-12-09 NOTE — Assessment & Plan Note (Signed)
Recent flare treated by her allergist Plan- refill albuterol neb solution

## 2020-12-17 ENCOUNTER — Other Ambulatory Visit: Payer: Self-pay | Admitting: *Deleted

## 2020-12-17 ENCOUNTER — Telehealth: Payer: Self-pay | Admitting: Internal Medicine

## 2020-12-17 MED ORDER — ALBUTEROL SULFATE (2.5 MG/3ML) 0.083% IN NEBU: 2.5000 mg | INHALATION_SOLUTION | RESPIRATORY_TRACT | 12 refills | Status: DC | PRN

## 2020-12-17 NOTE — Telephone Encounter (Signed)
Spoke with pt  She states pharm needed more info to fill her albuterol sol  Called [facility] and was advised there is no issue, and rx ready for pick up  Called pt back and made her aware  Nothing further needed

## 2021-01-23 ENCOUNTER — Telehealth: Payer: Self-pay | Admitting: Internal Medicine

## 2021-01-23 DIAGNOSIS — J45909 Unspecified asthma, uncomplicated: Secondary | ICD-10-CM

## 2021-01-23 MED ORDER — PROMETHAZINE-CODEINE 6.25-10 MG/5ML PO SYRP: 5.0000 mL | ORAL_SOLUTION | Freq: Four times a day (QID) | ORAL | 0 refills | Status: DC | PRN

## 2021-01-23 NOTE — Telephone Encounter (Signed)
Cough syrup refilled at her [facility] on [address]

## 2021-01-23 NOTE — Telephone Encounter (Signed)
Primary Pulmonologist: Dr. Maple Hudson Last office visit and with whom: 12/09/20 with Dr. Maple Hudson What do we see them for (pulmonary problems): Asthma with Bronchitis, OSA Last OV assessment/plan: see below  Was appointment offered to patient (explain)?    Recent flare treated by her allergist Plan- refill albuterol neb solution Benefits from CPAP Plan- continue CPAP 11/ [facility]  Return in about 6 months (around 06/11/2021). Cough syrup refilled at [facility]   Continue your CPAP   Glad your back surgery is done and that you can get back to using the Smart Vest since it helps you.   Please call if we can help          Reason for call: I called and spoke with patient regarding message. Patient cannot get rid of dry cough. Nothing comes up. Feels stuck in chest, occ chest pain. No other symptoms. Has been using Trelegy, rescue up to 4 times daily, nebs up to 4 times daily with little relief. Requesting refill on cough syrup. Will route to Dr. Maple Hudson for recs.  Dr. Maple Hudson, please advise. Thanks!  (examples of things to ask: : When did symptoms start? Fever? Cough? Productive? Color to sputum? More sputum than usual? Wheezing? Have you needed increased oxygen? Are you taking your respiratory medications? What over the counter measures have you tried?)  No Known Allergies  Immunization History  Administered Date(s) Administered   H1N1 05/15/2008   Hepatitis A, Ped/Adol-2 Dose 03/11/2010, 07/23/2015   Hepatitis B, ped/adol 06/19/2010   IPV 03/25/2010   Influenza Split 02/10/2011, 02/23/2012, 02/16/2013, 02/15/2014, 06/28/2015, 02/15/2017   Influenza, High Dose Seasonal PF 02/27/2015, 04/17/2016, 02/15/2017, 02/05/2018, 02/01/2019, 02/12/2020   Influenza, Seasonal, Injecte, Preservative Fre 03/05/2014   Influenza,inj,Quad PF,6+ Mos 02/15/2018   Influenza,inj,quad, With Preservative 02/15/2018   Influenza-Unspecified 02/15/2018   PFIZER(Purple Top)SARS-COV-2 Vaccination 06/09/2019,  06/30/2019, 03/13/2020, 11/27/2020   Pneumococcal Conjugate-13 12/23/2013   Pneumococcal Polysaccharide-23 03/30/2001, 05/08/2005, 06/27/2015   Pneumococcal-Unspecified 05/08/2005   Td 06/10/2010   Tdap 09/28/2007, 06/10/2010, 07/23/2015   Varicella 07/29/2006   Zoster Recombinat (Shingrix) 12/01/2018, 04/20/2019   Zoster, Live 11/10/2010

## 2021-01-23 NOTE — Telephone Encounter (Signed)
I called and spoke with patient regarding CY recs. Patient verbalized understanding, will call back if symptoms persist/worsen. Nothing further needed.

## 2021-01-27 ENCOUNTER — Encounter: Payer: Self-pay | Admitting: Adult Health

## 2021-01-27 ENCOUNTER — Other Ambulatory Visit: Payer: Self-pay

## 2021-01-27 ENCOUNTER — Ambulatory Visit (INDEPENDENT_AMBULATORY_CARE_PROVIDER_SITE_OTHER): Payer: Medicare Other

## 2021-01-27 ENCOUNTER — Telehealth: Payer: Self-pay | Admitting: Internal Medicine

## 2021-01-27 ENCOUNTER — Ambulatory Visit (INDEPENDENT_AMBULATORY_CARE_PROVIDER_SITE_OTHER): Payer: Medicare Other | Admitting: Adult Health

## 2021-01-27 VITALS — BP 140/74 | HR 88 | Temp 97.6°F | Ht 68.0 in | Wt 246.2 lb

## 2021-01-27 DIAGNOSIS — J45909 Unspecified asthma, uncomplicated: Secondary | ICD-10-CM

## 2021-01-27 MED ORDER — AMOXICILLIN-POT CLAVULANATE 875-125 MG PO TABS: 1.0000 | ORAL_TABLET | Freq: Two times a day (BID) | ORAL | 0 refills | Status: AC

## 2021-01-27 MED ORDER — PREDNISONE 10 MG PO TABS: ORAL_TABLET | ORAL | 0 refills | Status: DC

## 2021-01-27 NOTE — Telephone Encounter (Signed)
Sched pt w/ TP for Cough///hdp

## 2021-01-27 NOTE — Patient Instructions (Signed)
Covid test at home -call if positive .  Chest xray today  Augmentin 875mg  Twice daily  for 1 week Prednisone taper over next week .  Mucinex DM Twice daily  As needed  cough/congestion  Claritin daily  Trelegy 1 puff daily , rinse after use.  Albuterol inhaler or neb As needed   Fluids and rest  Follow up with Dr.  as planned and As needed   Please contact office for sooner follow up if symptoms do not improve or worsen or seek emergency care

## 2021-01-27 NOTE — Progress Notes (Signed)
@Patient  ID: , female    DOB: Aug 22, 1950, 71 y.o.   MRN: [id number]  Chief Complaint  Patient presents with   Acute Visit    Referring provider: [id number], NP  HPI: 70 year old female never smoker followed for asthmatic bronchitis, chronic cough, allergic rhinitis, chronic hoarseness with vocal cord paralysis/stance.  GERD and LPR.  Patient has sleep apnea.  TEST/EVENTS :  PFTs July 10, 2015 show FEV1 at 83%, ratio 78, FVC 82%, 10% bronchodilator change.  Positive mid flow reversibility, DLCO 66%.  High-res CT chest August 2017 no evidence of interstitial lung disease, stable tiny right middle lobe pulmonary nodules considered benign.  Mosaic attenuation of both lungs.  Minimal bronchiectasis in the medial right lower lobe stable.   01/27/2021 Acute OV: Cough  Patient presents for an acute office visit.  Patient complains over the last 2 weeks that she has had increased cough and congestion.  Nasal congestion postnasal drip initially had low-grade fevers.  She is had multiple family members with similar symptoms.  She was seen by her primary care provider and given a Depo-Medrol injection.  Patient says she only had minimal improvement in symptoms.  Continues to have ongoing cough with thick mucus.  She did not check for COVID-19.  She has been fully vaccinated for COVID-19 with 4 vaccines.  She is on Trelegy inhaler daily.  She has had increased albuterol use over the last week.  Denies any hemoptysis, chest pain, orthopnea nausea vomiting or diarrhea Has been using some over-the-counter cough medicines with some relief.  No Known Allergies  Immunization History  Administered Date(s) Administered   H1N1 05/15/2008   Hepatitis A, Ped/Adol-2 Dose 03/11/2010, 07/23/2015   Hepatitis B, ped/adol 06/19/2010   IPV 03/25/2010   Influenza Split 02/10/2011, 02/23/2012, 02/16/2013, 02/15/2014, 06/28/2015, 02/15/2017   Influenza, High Dose Seasonal PF 02/27/2015,  04/17/2016, 02/15/2017, 02/05/2018, 02/01/2019, 02/12/2020   Influenza, Seasonal, Injecte, Preservative Fre 03/05/2014   Influenza,inj,Quad PF,6+ Mos 02/15/2018   Influenza,inj,quad, With Preservative 02/15/2018   Influenza-Unspecified 02/15/2018   PFIZER(Purple Top)SARS-COV-2 Vaccination 06/09/2019, 06/30/2019, 03/13/2020, 11/27/2020   Pneumococcal Conjugate-13 12/23/2013   Pneumococcal Polysaccharide-23 03/30/2001, 05/08/2005, 06/27/2015   Pneumococcal-Unspecified 05/08/2005   Td 06/10/2010   Tdap 09/28/2007, 06/10/2010, 07/23/2015   Varicella 07/29/2006   Zoster Recombinat (Shingrix) 12/01/2018, 04/20/2019   Zoster, Live 11/10/2010    Past Medical History:  Diagnosis Date   Allergic rhinitis, cause unspecified    Arthritis    Bronchitis, asthmatic    Chronic bronchitis (HCC)    COPD (chronic obstructive pulmonary disease) (HCC)    "uncertain of having this"   Diabetes mellitus (HCC)    GERD (gastroesophageal reflux disease)    Headache    HTN (hypertension)    Hyperlipidemia    OSA on CPAP    Pneumonia    hx of years ago   Vocal cord paralysis     Tobacco History: Social History   Tobacco Use  Smoking Status Never  Smokeless Tobacco Never   Counseling given: Not Answered   Outpatient Medications Prior to Visit  Medication Sig Dispense Refill   albuterol (PROAIR HFA) 108 (90 Base) MCG/ACT inhaler Inhale 1-2 puffs into the lungs every 4 (four) hours as needed for wheezing or shortness of breath. 1 Inhaler 0   albuterol (PROVENTIL) (2.5 MG/3ML) 0.083% nebulizer solution Take 3 mLs (2.5 mg total) by nebulization every 4 (four) hours as needed for wheezing or shortness of breath. 75 mL 12   azelastine (ASTELIN) 137 MCG/SPRAY  nasal spray Place 1 spray into the nose 2 (two) times daily. Use in each nostril as directed     Beclomethasone Dipropionate (QNASL) 80 MCG/ACT AERS Place 1 spray into the nose daily. (Patient taking differently: Place 1 spray into the nose every  morning.) 1 Inhaler prn   busPIRone (BUSPAR) 15 MG tablet Take 1 tablet by mouth Twice daily.     Butalbital-APAP-Caffeine 50-300-40 MG CAPS butalbital-acetaminophen-caffeine 50 mg-300 mg-40 mg capsule     carbamazepine (TEGRETOL) 200 MG tablet Take 200 mg by mouth 2 (two) times daily.     clotrimazole (LOTRIMIN) 1 % cream APPLY CREAM TOPICALLY TO AFFECTED AREA TWICE DAILY AS NEEDED  1   CRESTOR 20 MG tablet Take 20 mg by mouth every morning.      cyanocobalamin (,VITAMIN B-12,) 1000 MCG/ML injection Inject 1,000 mcg into the muscle every 30 (thirty) days. Mid-month     cycloSPORINE (RESTASIS) 0.05 % ophthalmic emulsion Restasis 0.05 % eye drops in a dropperette     diazepam (VALIUM) 5 MG tablet Take 5 mg by mouth at bedtime as needed for sedation.     diclofenac (VOLTAREN) 75 MG EC tablet diclofenac sodium 75 mg tablet,delayed release     dicyclomine (BENTYL) 10 MG capsule      DULoxetine (CYMBALTA) 30 MG capsule      ergocalciferol (VITAMIN D2) 50000 units capsule once a week.     fluconazole (DIFLUCAN) 150 MG tablet fluconazole 150 mg tablet     fluticasone (FLONASE) 50 MCG/ACT nasal spray Place 1-2 sprays into the nose at bedtime. 16 g 6   gabapentin (NEURONTIN) 300 MG capsule Take 600 mg by mouth 2 (two) times daily.      glimepiride (AMARYL) 2 MG tablet Take 2 mg by mouth daily before breakfast.     hydrocortisone 2.5 % cream hydrocortisone 2.5 % topical cream     hydrOXYzine (ATARAX/VISTARIL) 25 MG tablet hydroxyzine HCl 25 mg tablet     hyoscyamine (LEVSIN, ANASPAZ) 0.125 MG tablet Take 0.125 mg by mouth every 6 (six) hours as needed.     ipratropium (ATROVENT) 0.06 % nasal spray ipratropium bromide 42 mcg (0.06 %) nasal spray     loratadine (CLARITIN) 10 MG tablet Take 10 mg by mouth every morning.     LORazepam (ATIVAN) 1 MG tablet lorazepam 1 mg tablet     losartan-hydrochlorothiazide (HYZAAR) 100-25 MG per tablet Take 1 tablet by mouth daily.     meclizine (ANTIVERT) 25 MG tablet  Take 25 mg by mouth 3 (three) times daily as needed for dizziness.     meloxicam (MOBIC) 15 MG tablet meloxicam 15 mg tablet     metFORMIN (GLUCOPHAGE) 1000 MG tablet Take 1,000 mg by mouth 2 (two) times daily.     montelukast (SINGULAIR) 10 MG tablet Take 1 tablet (10 mg total) by mouth daily. (Patient taking differently: Take 10 mg by mouth every morning.) 90 tablet 3   moxifloxacin (VIGAMOX) 0.5 % ophthalmic solution Vigamox 0.5 % eye drops     NEXIUM 40 MG capsule Take 1 capsule by mouth every morning.      ondansetron (ZOFRAN-ODT) 4 MG disintegrating tablet ondansetron 4 mg disintegrating tablet     oxybutynin (DITROPAN) 5 MG tablet oxybutynin chloride 5 mg tablet     pantoprazole (PROTONIX) 40 MG tablet pantoprazole 40 mg tablet,delayed release     polyvinyl alcohol (LIQUIFILM TEARS) 1.4 % ophthalmic solution Place 1 drop into both eyes 2 (two) times daily as needed for  dry eyes.     Potassium Chloride ER 20 MEQ TBCR potassium chloride ER 20 mEq tablet,extended release     PRESCRIPTION MEDICATION Inject 1 each into the skin every 14 (fourteen) days. Allergy shot.     promethazine-codeine (PHENERGAN WITH CODEINE) 6.25-10 MG/5ML syrup Take 5 mLs by mouth every 6 (six) hours as needed for cough. 240 mL 0   Respiratory Therapy Supplies (FLUTTER) DEVI Blow through 4 times, repeat three times daily 1 each 0   sertraline (ZOLOFT) 100 MG tablet Take 200 mg by mouth every morning.     tamsulosin (FLOMAX) 0.4 MG CAPS capsule tamsulosin 0.4 mg capsule     tiZANidine (ZANAFLEX) 4 MG tablet Take 1 tablet (4 mg total) by mouth every 6 (six) hours as needed for muscle spasms. 40 tablet 0   TRELEGY ELLIPTA 200-62.5-25 MCG/INH AEPB SMARTSIG:1 Puff(s) Via Inhaler Once     Vitamin D, Ergocalciferol, (DRISDOL) 50000 UNITS CAPS Take 1 capsule by mouth once a week. Monday     No facility-administered medications prior to visit.     Review of Systems:   Constitutional:   No  weight loss, night sweats,   Fevers, chills, fatigue, or  lassitude.  HEENT:   No headaches,  Difficulty swallowing,  Tooth/dental problems, or  Sore throat,                No sneezing, itching, ear ache,  +nasal congestion, post nasal drip,   CV:  No chest pain,  Orthopnea, PND, swelling in lower extremities, anasarca, dizziness, palpitations, syncope.   GI  No heartburn, indigestion, abdominal pain, nausea, vomiting, diarrhea, change in bowel habits, loss of appetite, bloody stools.   Resp: .  No chest wall deformity  Skin: no rash or lesions.  GU: no dysuria, change in color of urine, no urgency or frequency.  No flank pain, no hematuria   MS:  No joint pain or swelling.  No decreased range of motion.  No back pain.    Physical Exam  BP 140/74 (BP Location: Left Arm, Patient Position: Sitting, Cuff Size: Large)   Pulse 88   Temp 97.6 F (36.4 C) (Oral)   Ht 5\' 8"  (1.727 m)   Wt 246 lb 3.2 oz (111.7 kg)   SpO2 94%   BMI 37.43 kg/m   GEN: A/Ox3; pleasant , NAD, well nourished    HEENT:  [place]/AT,  NOSE-clear, THROAT-clear, no lesions, no postnasal drip or exudate noted.   NECK:  Supple w/ fair ROM; no JVD; normal carotid impulses w/o bruits; no thyromegaly or nodules palpated; no lymphadenopathy.    RESP  Clear  P & A; w/o, wheezes/ rales/ or rhonchi. no accessory muscle use, no dullness to percussion  CARD:  RRR, no m/r/g, no peripheral edema, pulses intact, no cyanosis or clubbing.  GI:   Soft & nt; nml bowel sounds; no organomegaly or masses detected.   Musco: Warm bil, no deformities or joint swelling noted.   Neuro: alert, no focal deficits noted.    Skin: Warm, no lesions or rashes    Lab Results:   BMET   BNP No results found for: BNP  ProBNP No results found for: PROBNP  Imaging: No results found.    PFT Results Latest Ref Rng & Units 07/10/2015  FVC-Pre L 2.73  FVC-Predicted Pre % 75  FVC-Post L 2.98  FVC-Predicted Post % 82  Pre FEV1/FVC % % 77  Post FEV1/FCV % %  78  FEV1-Pre L 2.11  FEV1-Predicted Pre % 75  FEV1-Post L 2.32  DLCO uncorrected ml/min/mmHg 19.70  DLCO UNC% % 66  DLCO corrected ml/min/mmHg 20.15  DLCO COR %Predicted % 67  DLVA Predicted % 84  TLC L 4.72  TLC % Predicted % 83  RV % Predicted % 64    No results found for: NITRICOXIDE      Assessment & Plan:   No problem-specific Assessment & Plan notes found for this encounter.     Rubye Oaks, NP 01/27/2021

## 2021-01-28 NOTE — Assessment & Plan Note (Signed)
Acute asthmatic bronchitis-patient continues to have ongoing symptoms past 2 weeks.  Do recommend that she do a home COVID test.  She had multiple family members with similar symptoms and had initial fever.  She may be out of her test window.  If positive asked her to call our office back to notify us. Will treat with empiric antibiotics and steroids.  Mucociliary clearance with mucolytic's. Check chest x-ray.  Continue on maintenance regimen with Trelegy  Plan  Patient Instructions  Covid test at home -call if positive .  Chest xray today  Augmentin 875mg  Twice daily  for 1 week Prednisone taper over next week .  Mucinex DM Twice daily  As needed  cough/congestion  Claritin daily  Trelegy 1 puff daily , rinse after use.  Albuterol inhaler or neb As needed   Fluids and rest  Follow up with Dr.  as planned and As needed   Please contact office for sooner follow up if symptoms do not improve or worsen or seek emergency care

## 2021-01-30 ENCOUNTER — Telehealth: Payer: Self-pay | Admitting: Internal Medicine

## 2021-01-30 NOTE — Telephone Encounter (Signed)
No msg needed.Dominique Robbins ° °

## 2021-01-30 NOTE — Telephone Encounter (Signed)
Called and spoke with [facility].  Fax for Dr. Maple Hudson to sign for cpap is being faxed to main office fax.  Will need OV notes faxed with signed order. Will hold encounter open until fax is received and faxed to [facility].

## 2021-01-31 NOTE — Telephone Encounter (Signed)
Call made to patient, confirmed DOB. Made aware the forms have been received and given to CY to sign. Voiced understanding.   Nothing further needed at this time.

## 2021-02-10 ENCOUNTER — Telehealth: Payer: Self-pay | Admitting: Internal Medicine

## 2021-02-10 MED ORDER — MOLNUPIRAVIR EUA 200MG CAPSULE: 4.0000 | ORAL_CAPSULE | Freq: Two times a day (BID) | ORAL | 0 refills | Status: AC

## 2021-02-10 NOTE — Telephone Encounter (Signed)
ATC patient, unable to leave message

## 2021-02-10 NOTE — Telephone Encounter (Signed)
Called and spoke with patient who states that CY told her to let him kow if she ever tested positive for covid. Patient states that her symptoms started Saturday and she tested positive this am on home test. Patient is having dry cough, sore throat, runny nose, chest tightness, very tired and no fever.    Dr. Maple Hudson please advise

## 2021-02-10 NOTE — Telephone Encounter (Signed)
Patient is returning phone call. Patient phone number is [phone].  

## 2021-02-10 NOTE — Telephone Encounter (Signed)
I'm sorry she is sick.  Offer malnupiravir 800 mg  twice daily x 5 days  Ok to also use otc cold and flu remedies as needed

## 2021-02-10 NOTE — Telephone Encounter (Signed)
Called and spoke with patient to go over Dr. Karmen Bongo. Patient expressed understanding and said she would like medication sent in. She verified pharmacy. RX has been sent in and also advised her to take OTC cold and flu remedies. Nothing further needed at this time.

## 2021-02-28 ENCOUNTER — Telehealth: Payer: Self-pay | Admitting: Internal Medicine

## 2021-02-28 DIAGNOSIS — J45909 Unspecified asthma, uncomplicated: Secondary | ICD-10-CM

## 2021-02-28 MED ORDER — PROMETHAZINE-CODEINE 6.25-10 MG/5ML PO SYRP: 5.0000 mL | ORAL_SOLUTION | Freq: Four times a day (QID) | ORAL | 0 refills | Status: DC | PRN

## 2021-02-28 NOTE — Telephone Encounter (Signed)
Cough syrup refilled at [facility]

## 2021-02-28 NOTE — Telephone Encounter (Signed)
LM informing patient medication has been sent to pharmacy.   Nothing further needed at this time.

## 2021-02-28 NOTE — Telephone Encounter (Signed)
CY please advise on refill of Phenergan with Codeine. Medication has been pended. Thanks :)  Last Filled: 01/23/21 Last OV:01/27/21

## 2021-03-14 ENCOUNTER — Telehealth: Payer: Self-pay | Admitting: Internal Medicine

## 2021-03-14 MED ORDER — HYDROCODONE BIT-HOMATROP MBR 5-1.5 MG/5ML PO SOLN: 5.0000 mL | Freq: Four times a day (QID) | ORAL | 0 refills | Status: DC | PRN

## 2021-03-14 NOTE — Telephone Encounter (Addendum)
  Received recording to enter mailbox number on mobile number on file.  Will call back.

## 2021-03-14 NOTE — Telephone Encounter (Signed)
Hycodan sent to [facility]

## 2021-03-14 NOTE — Telephone Encounter (Signed)
Spoke to patient, who stated that she contacted [facility] and was advised that [facility] no longer carries Phenergan with codeine.  She is requesting alternative.   Dr. Maple Hudson, please advise. Thanks

## 2021-03-14 NOTE — Telephone Encounter (Signed)
Patient is aware of below message and voiced her understanding.  Nothing further needed.   

## 2021-03-14 NOTE — Telephone Encounter (Signed)
ATC patient on mobile number listed. Received recording 'welcome to message manage system, please enter your mailbox number".

## 2021-03-14 NOTE — Telephone Encounter (Signed)
Patient is returning phone call. Patient phone number is [phone].  

## 2021-03-17 NOTE — Telephone Encounter (Signed)
I called the patient back and she reports that none of  the pharmacies are carrying Hycodan. I called the pharmacy and they just needed a Diagnosis Code and I gave her the cough per the last note. Nothing further needed.  Patient is aware that she can pick up her prescription.

## 2021-03-18 ENCOUNTER — Telehealth: Payer: Self-pay | Admitting: Internal Medicine

## 2021-03-18 MED ORDER — HYDROCOD POLST-CPM POLST ER 10-8 MG/5ML PO SUER: 5.0000 mL | Freq: Two times a day (BID) | ORAL | 0 refills | Status: DC | PRN

## 2021-03-18 NOTE — Telephone Encounter (Signed)
Call made to patient, confirmed DOB. She reports the pharmacy told her the cough medicine would be over $100 and she cannot afford that.   She has a chronic dry cough that she has had forever per patient. She is requesting an alternative cough medicine. She is going to contact her insurance to find out what is comparable or affordable.   Cy please advise, do you have any other recommendations. Thanks :)

## 2021-03-18 NOTE — Telephone Encounter (Signed)
LM informing patient Dominique Robbins has sent in Tussionex.   Nothing further needed at this time.

## 2021-03-18 NOTE — Telephone Encounter (Signed)
I have sent script for generic tussionex to her [facility]. All of these are expensive now.

## 2021-03-19 NOTE — Telephone Encounter (Signed)
Called and spoke with pt letting her know that CY sent Rx for Tussionex to pharmacy for her and she verablized understanding. Nothing further needed.

## 2021-06-05 ENCOUNTER — Other Ambulatory Visit: Payer: Self-pay

## 2021-06-05 ENCOUNTER — Ambulatory Visit (INDEPENDENT_AMBULATORY_CARE_PROVIDER_SITE_OTHER): Payer: Medicare Other | Admitting: Internal Medicine

## 2021-06-05 ENCOUNTER — Encounter: Payer: Self-pay | Admitting: Internal Medicine

## 2021-06-05 DIAGNOSIS — G4733 Obstructive sleep apnea (adult) (pediatric): Secondary | ICD-10-CM

## 2021-06-05 DIAGNOSIS — J45909 Unspecified asthma, uncomplicated: Secondary | ICD-10-CM

## 2021-06-05 MED ORDER — ALBUTEROL SULFATE HFA 108 (90 BASE) MCG/ACT IN AERS: 1.0000 | INHALATION_SPRAY | RESPIRATORY_TRACT | 12 refills | Status: DC | PRN

## 2021-06-05 MED ORDER — BREZTRI AEROSPHERE 160-9-4.8 MCG/ACT IN AERO: 2.0000 | INHALATION_SPRAY | Freq: Two times a day (BID) | RESPIRATORY_TRACT | 0 refills | Status: DC

## 2021-06-05 NOTE — Progress Notes (Signed)
Patient ID: Dominique Robbins, female    DOB: [birth year], 71 y.o.   MRN: [id number]  HPI 10/16/10- 13 yo never smoker followed for asthma/ bronchiectasis, lung nodules, allergic rhinitis, OSA, vocal cord paresis/ hoarseness. PFT: 07/10/2015-mild obstructive airways disease with minimal/insignificant response to bronchodilator, diffusion mildly reduced. FVC 2.98/82%, FEV1 2.32/83%, FEV1/FVC 0.78, TLC 83%, DLCO 66% NPSG 04/06/2000-AHI 33/hour, desaturation to 81% Home Sleep Test 09/19/15-AHI 37.5/hour, desaturation to 84%, body weight 273 pounds.  ---------------------------------------------------------------   12/09/20- 71 year old female never smoker for Cough, allergic Rhinitis, Vocal Cord paralysis/hoarseness / Stents/ LPR, lung nodules, OSA, Asthmatic Bronchitis, Complicated by DM2,HTN, Hyperlipidemia, GERD,  -Smart Vest, pro-air HFA/ Xopenex hfa, Dulera 200, Spiriva HandiHaler, neb albuterol/ ipratropium, Qnasl, Prometh -codeine cough syrup,Trelegy  CPAP 11/ [employer] [facility]- N/A today Body weight today-232 lbs Covid vax-4 Phizer -----Feels better since finishing Prednisone, SOB, fatigue  ACT score-12 Using CPAP every night and sleeps well with it. No changes. No download available today. Recent back surgery- recovering well. Stopped [employer] (consulting) for recovery, but it helps and she is resuming now. Still needs codeine cough syrup  Her allergist recently changed her from Breztri to Trelegy 200 and gave prednisone taper ending yesterday. Asks refill neb solution. CXR 06/11/20- IMPRESSION: No pulmonary nodule seen. Enlargement of cardiac silhouette. No acute abnormalities.  06/05/21-  71 year old female never smoker for Cough, allergic Rhinitis, Vocal Cord paralysis/hoarseness / Stents/ LPR, lung nodules, OSA, Asthmatic Bronchitis, Complicated by DM2,HTN, Hyperlipidemia, GERD,  -Smart Vest, pro-air HFA/ Xopenex hfa, Dulera 200, neb albuterol/ ipratropium, Qnasl, Tussionex or  Hycodan,Trelegy 200, Singulair CPAP 11/ [employer]-  Body weight today-234 lbs Covid vax-5 Phizer Flu vax- had She reports routinely using CPAP every night without problems.  Download not available today. She continues allergy vaccine from her allergist.  Reports that insurance will no longer cover Symbicort or Singulair.  We discussed options and will give sample respiratory inhaler that we have available, for trial.  She remains dependent on cough syrups for chronic cough which I suspect reflects microaspiration from her vocal cord stents. CXR 01/27/21- IMPRESSION: Negative for acute cardiopulmonary disease   Review of Systems- see HPI + = positive Constitutional:   No-   weight loss, night sweats, fevers, chills, fatigue, lassitude. HEENT:   No-  headaches, difficulty swallowing, tooth/dental problems, sore throat,       No-  sneezing, itching, ear ache, nasal congestion, post nasal drip,  CV:  No-   chest pain, orthopnea, PND, swelling in lower extremities, anasarca, dizziness, palpitations Resp: +shortness of breath with exertion or at rest.                productive cough,  + non-productive cough,  No- coughing up of blood.              No-   change in color of mucus.  + wheezing.   Skin: No-   rash or lesions. GI:  No-   heartburn, indigestion, abdominal pain, nausea, vomiting,  GU MS:  Neuro-     nothing unusual Psych:  No- change in mood or affect. No depression or anxiety.  No memory loss.    Objective:   Physical Exam General- Alert, Oriented, Affect-appropriate, Distress- none acute, +obese Skin- rash-none, lesions- none, excoriation- none Lymphadenopathy- none Head- atraumatic            Eyes- Gross vision intact, PERRLA, conjunctivae clear secretions            Ears- Hearing, canals-normal  Nose- Clear, no-Septal dev, mucus, polyps, erosion, perforation.              Throat- Mallampati IV , mucosa clear , drainage- none, tonsils- atrophic,  + remains  hoarse, without stridor              Neck- flexible , trachea midline, no stridor , thyroid nl, carotid no bruit Chest - symmetrical excursion , unlabored           Heart/CV- RRR , no murmur , no gallop  , no rub, nl s1 s2                           - JVD- none , edema- none, stasis changes- none, varices- none           Lung- clear to P&A, wheeze- none, cough+dry , dullness-none, rub- none           Chest wall-  Abd- Br/ Gen/ Rectal- Not done, not indicated Extrem- cyanosis- none, clubbing, none, atrophy- none, strength- nl. + cane Neuro- grossly intact to observation

## 2021-06-05 NOTE — Patient Instructions (Addendum)
Order- 2 samples Breztri inhaler       inhale 2 puffs then rinse mouth, twice daily  Try without Singulair/ montelukast. See if you miss it.  Albuterol rescue inhaler refill sent  Please call if we can help

## 2021-06-06 NOTE — Assessment & Plan Note (Signed)
Encourage weight loss. 

## 2021-06-06 NOTE — Assessment & Plan Note (Signed)
She benefits from CPAP.  We are trying to get card placed for download. Plan-continue CPAP 11. Machine is replaced anticipate change to auto 5-15

## 2021-06-06 NOTE — Assessment & Plan Note (Signed)
Continue smart vest and flutter valve. Plan-sample Breztri today for trial.  Try without Singulair since insurance will not cover.

## 2021-06-10 ENCOUNTER — Ambulatory Visit: Payer: Medicare Other | Admitting: Internal Medicine

## 2021-06-11 ENCOUNTER — Ambulatory Visit: Payer: Medicare Other | Admitting: Internal Medicine

## 2021-12-03 ENCOUNTER — Encounter: Payer: Self-pay | Admitting: Internal Medicine

## 2021-12-03 NOTE — Progress Notes (Signed)
Patient ID: Dominique Robbins, female    DOB: [birth year], 71 y.o.   MRN: [id number]  HPI 10/16/10- 48 yo never smoker followed for asthma/ bronchiectasis, lung nodules, allergic rhinitis, OSA, vocal cord paresis/ hoarseness. PFT: 07/10/2015-mild obstructive airways disease with minimal/insignificant response to bronchodilator, diffusion mildly reduced. FVC 2.98/82%, FEV1 2.32/83%, FEV1/FVC 0.78, TLC 83%, DLCO 66% NPSG 04/06/2000-AHI 33/hour, desaturation to 81% Home Sleep Test 09/19/15-AHI 37.5/hour, desaturation to 84%, body weight 273 pounds.  ---------------------------------------------------------------   06/05/21-  71 year old female never smoker for Cough, allergic Rhinitis, Vocal Cord paralysis/hoarseness / Stents/ LPR, lung nodules, OSA, Asthmatic Bronchitis, Complicated by DM2,HTN, Hyperlipidemia, GERD,  -Smart Vest, pro-air HFA/ Xopenex hfa, Dulera 200, neb albuterol/ ipratropium, Qnasl, Tussionex or Hycodan,Trelegy 200, Singulair CPAP 11/ [employer]-  Body weight today-234 lbs Covid vax-5 Phizer Flu vax- had She reports routinely using CPAP every night without problems.  Download not available today. She continues allergy vaccine from her allergist.  Reports that insurance will no longer cover Symbicort or Singulair.  We discussed options and will give sample respiratory inhaler that we have available, for trial.  She remains dependent on cough syrups for chronic cough which I suspect reflects microaspiration from her vocal cord stents. CXR 01/27/21- IMPRESSION: Negative for acute cardiopulmonary disease  12/04/21- 71 year old female never smoker for Cough, allergic Rhinitis, Vocal Cord paralysis/hoarseness / Stents/ LPR, lung nodules, OSA, Asthmatic Bronchitis, Complicated by DM2,HTN, Hyperlipidemia, GERD,  -Smart Vest, pro-air HFA/ Xopenex hfa, Dulera 200, neb albuterol/ ipratropium, Qnasl, Tussionex or Hycodan,TBreztri, Singulair CPAP 11/ [employer]- 100%, AHI 1.7/  hr Body weight today-267 lbs Covid vax-5 Phizer   Review of Systems- see HPI + = positive Constitutional:   No-   weight loss, night sweats, fevers, chills, fatigue, lassitude. HEENT:   No-  headaches, difficulty swallowing, tooth/dental problems, sore throat,       No-  sneezing, itching, ear ache, nasal congestion, post nasal drip,  CV:  No-   chest pain, orthopnea, PND, swelling in lower extremities, anasarca, dizziness, palpitations Resp: +shortness of breath with exertion or at rest.                productive cough,  + non-productive cough,  No- coughing up of blood.              No-   change in color of mucus.  + wheezing.   Skin: No-   rash or lesions. GI:  No-   heartburn, indigestion, abdominal pain, nausea, vomiting,  GU MS:  Neuro-     nothing unusual Psych:  No- change in mood or affect. No depression or anxiety.  No memory loss.    Objective:   Physical Exam General- Alert, Oriented, Affect-appropriate, Distress- none acute, +obese Skin- rash-none, lesions- none, excoriation- none Lymphadenopathy- none Head- atraumatic            Eyes- Gross vision intact, PERRLA, conjunctivae clear secretions            Ears- Hearing, canals-normal            Nose- Clear, no-Septal dev, mucus, polyps, erosion, perforation.              Throat- Mallampati IV , mucosa clear , drainage- none, tonsils- atrophic,  + remains hoarse, without stridor              Neck- flexible , trachea midline, no stridor , thyroid nl, carotid no bruit Chest - symmetrical excursion , unlabored  Heart/CV- RRR , no murmur , no gallop  , no rub, nl s1 s2                           - JVD- none , edema- none, stasis changes- none, varices- none           Lung- clear to P&A, wheeze- none, cough+dry , dullness-none, rub- none           Chest wall-  Abd- Br/ Gen/ Rectal- Not done, not indicated Extrem- cyanosis- none, clubbing, none, atrophy- none, strength- nl. + cane Neuro- grossly intact to  observation

## 2021-12-04 ENCOUNTER — Encounter: Payer: Self-pay | Admitting: Internal Medicine

## 2021-12-04 ENCOUNTER — Ambulatory Visit (INDEPENDENT_AMBULATORY_CARE_PROVIDER_SITE_OTHER): Payer: Medicare Other | Admitting: Internal Medicine

## 2021-12-04 DIAGNOSIS — G4733 Obstructive sleep apnea (adult) (pediatric): Secondary | ICD-10-CM

## 2021-12-04 DIAGNOSIS — J479 Bronchiectasis, uncomplicated: Secondary | ICD-10-CM

## 2021-12-04 DIAGNOSIS — J383 Other diseases of vocal cords: Secondary | ICD-10-CM | POA: Diagnosis not present

## 2021-12-04 MED ORDER — HYDROCODONE BIT-HOMATROP MBR 5-1.5 MG/5ML PO SOLN: 5.0000 mL | Freq: Four times a day (QID) | ORAL | 0 refills | Status: DC | PRN

## 2021-12-04 NOTE — Assessment & Plan Note (Signed)
Chronic hoarseness and chronic associated microaspiration likely the cause of her bronchiectasis and chronic cough. Plan-continue reflux precautions.  Hycodan refilled.

## 2021-12-04 NOTE — Assessment & Plan Note (Signed)
Benefits from CPAP with good compliance and control. Plan-continue CPAP 11
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
## 2021-12-04 NOTE — Patient Instructions (Signed)
Order- DME [facility]- please install AirView on CPAP machine  Hycodan cough syrup refill sent  Please call if we can help

## 2022-03-03 ENCOUNTER — Telehealth: Payer: Self-pay | Admitting: Internal Medicine

## 2022-03-04 MED ORDER — HYDROCOD POLI-CHLORPHE POLI ER 10-8 MG/5ML PO SUER: 5.0000 mL | Freq: Two times a day (BID) | ORAL | 0 refills | Status: AC | PRN

## 2022-03-04 NOTE — Telephone Encounter (Signed)
Called patient and she states that she is needing a refill on her cough medication the Tussionex.   Cough is still pretty active for patient.  Please advise sir

## 2022-03-04 NOTE — Telephone Encounter (Signed)
Tussionex refilled 

## 2022-03-04 NOTE — Telephone Encounter (Signed)
Called and informed patient that her medication was refilled. Nothing further needed

## 2022-03-06 ENCOUNTER — Telehealth: Payer: Self-pay | Admitting: Internal Medicine

## 2022-03-06 MED ORDER — HYDROCODONE BIT-HOMATROP MBR 5-1.5 MG/5ML PO SOLN: 5.0000 mL | Freq: Four times a day (QID) | ORAL | 0 refills | Status: AC | PRN

## 2022-03-06 NOTE — Telephone Encounter (Signed)
On 10/18 when Dominique Robbins spoke with pt, Dominique Robbins documented that pt was needing a refill of her Tussionex.  Attempted to call pt but unable to reach. Left message for her to return call.

## 2022-03-06 NOTE — Telephone Encounter (Signed)
Hycodan sent to [facility].  We can d/c the tussionex

## 2022-03-06 NOTE — Telephone Encounter (Signed)
Called and spoke with pt stating to her the info from 10/18. Pt said she had requested the wrong cough med to be prescribed. She stated when she went to the pharmacy to get the Rx, when the told her that the Rx for tussionex had been sent in, she told them to put it back as she had requested the wrong one.  Called pt's pharmacy and spoke with Tillie Rung to see if pt picked up the Tussionex or if she did request to have the med put back and was told by Tillie Rung that pt did not pick the Rx up as the Rx is still at the pharmacy.  Dr. Annamaria Boots, please advise if you are okay sending Hycodan cough syrup to the pharmacy for pt as that is what she really wanted to have sent in.  No Known Allergies   Current Outpatient Medications:    albuterol (PROAIR HFA) 108 (90 Base) MCG/ACT inhaler, Inhale 1-2 puffs into the lungs every 4 (four) hours as needed for wheezing or shortness of breath., Disp: 1 each, Rfl: 12   albuterol (PROVENTIL) (2.5 MG/3ML) 0.083% nebulizer solution, Take 3 mLs (2.5 mg total) by nebulization every 4 (four) hours as needed for wheezing or shortness of breath., Disp: 75 mL, Rfl: 12   azelastine (ASTELIN) 137 MCG/SPRAY nasal spray, Place 1 spray into the nose 2 (two) times daily. Use in each nostril as directed, Disp: , Rfl:    Beclomethasone Dipropionate (QNASL) 80 MCG/ACT AERS, Place 1 spray into the nose daily. (Patient taking differently: Place 1 spray into the nose every morning.), Disp: 1 Inhaler, Rfl: prn   busPIRone (BUSPAR) 15 MG tablet, Take 1 tablet by mouth Twice daily., Disp: , Rfl:    Butalbital-APAP-Caffeine 50-300-40 MG CAPS, butalbital-acetaminophen-caffeine 50 mg-300 mg-40 mg capsule, Disp: , Rfl:    carbamazepine (TEGRETOL) 200 MG tablet, Take 200 mg by mouth 2 (two) times daily., Disp: , Rfl:    chlorpheniramine-HYDROcodone (TUSSIONEX PENNKINETIC ER) 10-8 MG/5ML SUER, Take 5 mLs by mouth every 12 (twelve) hours as needed for cough., Disp: 140 mL, Rfl: 0    chlorpheniramine-HYDROcodone (TUSSIONEX) 10-8 MG/5ML, Take 5 mLs by mouth every 12 (twelve) hours as needed for cough., Disp: 100 mL, Rfl: 0   clotrimazole (LOTRIMIN) 1 % cream, APPLY CREAM TOPICALLY TO AFFECTED AREA TWICE DAILY AS NEEDED, Disp: , Rfl: 1   CRESTOR 20 MG tablet, Take 20 mg by mouth every morning. , Disp: , Rfl:    cyanocobalamin (,VITAMIN B-12,) 1000 MCG/ML injection, Inject 1,000 mcg into the muscle every 30 (thirty) days. Mid-month, Disp: , Rfl:    cycloSPORINE (RESTASIS) 0.05 % ophthalmic emulsion, Restasis 0.05 % eye drops in a dropperette, Disp: , Rfl:    diazepam (VALIUM) 5 MG tablet, Take 5 mg by mouth at bedtime as needed for sedation., Disp: , Rfl:    diclofenac (VOLTAREN) 75 MG EC tablet, diclofenac sodium 75 mg tablet,delayed release, Disp: , Rfl:    dicyclomine (BENTYL) 10 MG capsule, , Disp: , Rfl:    DULoxetine (CYMBALTA) 30 MG capsule, , Disp: , Rfl:    ergocalciferol (VITAMIN D2) 50000 units capsule, once a week., Disp: , Rfl:    fluconazole (DIFLUCAN) 150 MG tablet, fluconazole 150 mg tablet, Disp: , Rfl:    fluticasone (FLONASE) 50 MCG/ACT nasal spray, Place 1-2 sprays into the nose at bedtime., Disp: 16 g, Rfl: 6   gabapentin (NEURONTIN) 300 MG capsule, Take 600 mg by mouth 2 (two) times daily. , Disp: , Rfl:  glimepiride (AMARYL) 2 MG tablet, Take 2 mg by mouth daily before breakfast., Disp: , Rfl:    HYDROcodone bit-homatropine (HYCODAN) 5-1.5 MG/5ML syrup, Take 5 mLs by mouth every 6 (six) hours as needed for cough., Disp: 240 mL, Rfl: 0   hydrocortisone 2.5 % cream, hydrocortisone 2.5 % topical cream, Disp: , Rfl:    hydrOXYzine (ATARAX/VISTARIL) 25 MG tablet, hydroxyzine HCl 25 mg tablet, Disp: , Rfl:    hyoscyamine (LEVSIN, ANASPAZ) 0.125 MG tablet, Take 0.125 mg by mouth every 6 (six) hours as needed., Disp: , Rfl:    ipratropium (ATROVENT) 0.06 % nasal spray, ipratropium bromide 42 mcg (0.06 %) nasal spray, Disp: , Rfl:    loratadine (CLARITIN) 10 MG  tablet, Take 10 mg by mouth every morning., Disp: , Rfl:    LORazepam (ATIVAN) 1 MG tablet, lorazepam 1 mg tablet, Disp: , Rfl:    losartan-hydrochlorothiazide (HYZAAR) 100-25 MG per tablet, Take 1 tablet by mouth daily., Disp: , Rfl:    meclizine (ANTIVERT) 25 MG tablet, Take 25 mg by mouth 3 (three) times daily as needed for dizziness., Disp: , Rfl:    meloxicam (MOBIC) 15 MG tablet, meloxicam 15 mg tablet, Disp: , Rfl:    metFORMIN (GLUCOPHAGE) 1000 MG tablet, Take 1,000 mg by mouth 2 (two) times daily., Disp: , Rfl:    moxifloxacin (VIGAMOX) 0.5 % ophthalmic solution, Vigamox 0.5 % eye drops, Disp: , Rfl:    NEXIUM 40 MG capsule, Take 1 capsule by mouth every morning. , Disp: , Rfl:    ondansetron (ZOFRAN-ODT) 4 MG disintegrating tablet, ondansetron 4 mg disintegrating tablet, Disp: , Rfl:    oxybutynin (DITROPAN) 5 MG tablet, oxybutynin chloride 5 mg tablet, Disp: , Rfl:    pantoprazole (PROTONIX) 40 MG tablet, pantoprazole 40 mg tablet,delayed release, Disp: , Rfl:    polyvinyl alcohol (LIQUIFILM TEARS) 1.4 % ophthalmic solution, Place 1 drop into both eyes 2 (two) times daily as needed for dry eyes., Disp: , Rfl:    Potassium Chloride ER 20 MEQ TBCR, potassium chloride ER 20 mEq tablet,extended release, Disp: , Rfl:    PRESCRIPTION MEDICATION, Inject 1 each into the skin every 14 (fourteen) days. Allergy shot., Disp: , Rfl:    Respiratory Therapy Supplies (FLUTTER) DEVI, Blow through 4 times, repeat three times daily, Disp: 1 each, Rfl: 0   sertraline (ZOLOFT) 100 MG tablet, Take 200 mg by mouth every morning., Disp: , Rfl:    tamsulosin (FLOMAX) 0.4 MG CAPS capsule, tamsulosin 0.4 mg capsule, Disp: , Rfl:    tiZANidine (ZANAFLEX) 4 MG tablet, Take 1 tablet (4 mg total) by mouth every 6 (six) hours as needed for muscle spasms., Disp: 40 tablet, Rfl: 0   Vitamin D, Ergocalciferol, (DRISDOL) 50000 UNITS CAPS, Take 1 capsule by mouth once a week. Monday, Disp: , Rfl:

## 2022-03-06 NOTE — Telephone Encounter (Signed)
Called and informed patient that correct medication was called in this time. Nothing further needed

## 2022-05-28 ENCOUNTER — Other Ambulatory Visit: Payer: Self-pay

## 2022-05-28 ENCOUNTER — Telehealth: Payer: Self-pay | Admitting: Internal Medicine

## 2022-05-28 MED ORDER — ALBUTEROL SULFATE (2.5 MG/3ML) 0.083% IN NEBU: 2.5000 mg | INHALATION_SOLUTION | RESPIRATORY_TRACT | 12 refills | Status: AC | PRN

## 2022-05-28 MED ORDER — ALBUTEROL SULFATE HFA 108 (90 BASE) MCG/ACT IN AERS: 1.0000 | INHALATION_SPRAY | RESPIRATORY_TRACT | 12 refills | Status: DC | PRN

## 2022-05-28 NOTE — Telephone Encounter (Signed)
Advised patient of refills being sent to her pharmacy. Nothing further needed.

## 2022-08-03 ENCOUNTER — Encounter: Payer: Self-pay | Admitting: Internal Medicine

## 2022-08-03 ENCOUNTER — Ambulatory Visit: Payer: Medicare HMO | Admitting: Internal Medicine

## 2022-08-03 ENCOUNTER — Ambulatory Visit (INDEPENDENT_AMBULATORY_CARE_PROVIDER_SITE_OTHER): Payer: Medicare HMO

## 2022-08-03 ENCOUNTER — Telehealth: Payer: Self-pay | Admitting: Internal Medicine

## 2022-08-03 VITALS — BP 122/64 | HR 90 | Ht 68.0 in | Wt 268.6 lb

## 2022-08-03 DIAGNOSIS — G4733 Obstructive sleep apnea (adult) (pediatric): Secondary | ICD-10-CM

## 2022-08-03 DIAGNOSIS — J45901 Unspecified asthma with (acute) exacerbation: Secondary | ICD-10-CM

## 2022-08-03 DIAGNOSIS — J45909 Unspecified asthma, uncomplicated: Secondary | ICD-10-CM

## 2022-08-03 MED ORDER — HYDROCOD POLI-CHLORPHE POLI ER 10-8 MG/5ML PO SUER: 5.0000 mL | Freq: Two times a day (BID) | ORAL | 0 refills | Status: AC | PRN

## 2022-08-03 NOTE — Telephone Encounter (Signed)
Ok to use held-spot for Ms [employer]

## 2022-08-03 NOTE — Patient Instructions (Addendum)
Order- depo 120  dx Asthmatic bronchitis exacerbation  Order- CXR    dx asthmatic bronchitis exacerbation  Keep July appointment

## 2022-08-03 NOTE — Telephone Encounter (Signed)
Scheduled patient for this evening with Dr. Annamaria Boots. Advised patient to come in a few minutes early in case labs or imaging is needed. Nothing further needed.

## 2022-08-03 NOTE — Telephone Encounter (Signed)
Pt called the office stating that she is having a flare up. Pt is having increased SOB, coughing getting up phlegm that is white-clear in color. Pt is also wheezing.  Pt has had to use rescue inhaler at least 5-6 times daily recently due to the current flare.  Pt denies any complaints of fever.   Due to her symptoms, pt is wanting to know if she could be worked in for an appt with Dr. Annamaria Boots. Dr. Annamaria Boots, please advise if you are okay with [place] using one of your RNA slots for scheduling pt an appt.   **Please route back to triage pool, not directly back to me**

## 2022-08-03 NOTE — Progress Notes (Signed)
Patient ID: Dominique Robbins, female    DOB: 04/20/1951, 72 y.o.   MRN: [id number]  HPI 10/16/10- 48 yo never smoker followed for asthma/ bronchiectasis, lung nodules, allergic rhinitis, OSA, vocal cord paresis/ hoarseness. PFT: 07/10/2015-mild obstructive airways disease with minimal/insignificant response to bronchodilator, diffusion mildly reduced. FVC 2.98/82%, FEV1 2.32/83%, FEV1/FVC 0.78, TLC 83%, DLCO 66% NPSG 04/06/2000-AHI 33/hour, desaturation to 81% Home Sleep Test 09/19/15-AHI 37.5/hour, desaturation to 84%, body weight 273 pounds.  ---------------------------------------------------------------  12/04/21- 72 year old female never smoker for Cough, allergic Rhinitis, Vocal Cord paralysis/hoarseness / Stents/ LPR, lung nodules, OSA, Asthmatic Bronchitis, Complicated by DM2,HTN, Hyperlipidemia, GERD,  -Smart Vest, pro-air HFA/ Xopenex hfa, Dulera 200, neb albuterol/ ipratropium, Qnasl, Tussionex or Hycodan,TBreztri, Singulair CPAP 11/ [employer]- 100%, AHI 1.7/ hr Body weight today-267 lbs Covid vax-5 Phizer  08/03/22- 72 year old female never smoker for Cough, allergic Rhinitis, Vocal Cord paralysis/hoarseness / Stents/ LPR, lung nodules, OSA, Asthmatic Bronchitis, Complicated by DM2, HTN, Hyperlipidemia, GERD, Sjogren's Syndrome, Hypogammaglobulinemia, Cutaneous Lupus,  -Smart Vest, pro-air HFA/ Xopenex hfa, Dulera 200, neb albuterol/ ipratropium, Qnasl, Tussionex or Hycodan,Breztri, Singulair CPAP 11/ [employer]- 100%, AHI 1.4/ hr Body weight today-268 lbs Covid vax-5 [employer]izer -Wheezing, cough with white phlegm and sob x1 week She blames "weather and pollen" for exacerbation of her chronic cough.  Has not recognized any aspiration event, infection, reflux or sinus drainage as a specific event.   Review of Systems- see HPI + = positive Constitutional:   No-   weight loss, night sweats, fevers, chills, fatigue, lassitude. HEENT:   No-  headaches, difficulty swallowing,  tooth/dental problems, sore throat,       No-  sneezing, itching, ear ache, nasal congestion, post nasal drip,  CV:  No-   chest pain, orthopnea, PND, swelling in lower extremities, anasarca, dizziness, palpitations Resp: +shortness of breath with exertion or at rest.                productive cough,  + non-productive cough,  No- coughing up of blood.              No-   change in color of mucus.  + wheezing.   Skin: No-   rash or lesions. GI:  No-   heartburn, indigestion, abdominal pain, nausea, vomiting,  GU MS:  Neuro-     nothing unusual Psych:  No- change in mood or affect. No depression or anxiety.  No memory loss.    Objective:   Physical Exam General- Alert, Oriented, Affect-appropriate, Distress- none acute, +obese Skin- rash-none, lesions- none, excoriation- none Lymphadenopathy- none Head- atraumatic            Eyes- Gross vision intact, PERRLA, conjunctivae clear secretions            Ears- Hearing, canals-normal            Nose- Clear, no-Septal dev, mucus, polyps, erosion, perforation.              Throat- Mallampati IV , mucosa clear , drainage- none, tonsils- atrophic,  + remains hoarse, without stridor              Neck- flexible , trachea midline, no stridor , thyroid nl, carotid no bruit Chest - symmetrical excursion , unlabored           Heart/CV- RRR , no murmur , no gallop  , no rub, nl s1 s2                           -  JVD- none , edema- none, stasis changes- none, varices- none           Lung- clear to P&A, wheeze- none, cough+ active/dry , dullness-none, rub- none           Chest wall-  Abd- Br/ Gen/ Rectal- Not done, not indicated Extrem- cyanosis- none, clubbing, none, atrophy- none, strength- nl. + cane Neuro- grossly intact to observation

## 2022-08-28 ENCOUNTER — Encounter: Payer: Self-pay | Admitting: Internal Medicine

## 2022-08-28 NOTE — Assessment & Plan Note (Signed)
She is doing well with CPAP.  Download reviewed. Plan-continue CPAP 11

## 2022-08-28 NOTE — Assessment & Plan Note (Signed)
Recent exacerbation without obvious infection.  Most likely is she is correct and blaming weather and pollen.  We discussed options. Plan-Depo-Medrol, chest x-ray, Tussionex

## 2022-10-19 ENCOUNTER — Telehealth: Payer: Self-pay | Admitting: Internal Medicine

## 2022-10-19 ENCOUNTER — Other Ambulatory Visit: Payer: Self-pay

## 2022-10-19 MED ORDER — AZITHROMYCIN 250 MG PO TABS: ORAL_TABLET | ORAL | 0 refills | Status: DC

## 2022-10-19 NOTE — Telephone Encounter (Signed)
Patient state having symptoms of cough and headache. Pharmacy is [facility]. Patient phone number is [phone].

## 2022-10-19 NOTE — Telephone Encounter (Signed)
Informed patient Zpak called into pharmacy.

## 2022-10-19 NOTE — Telephone Encounter (Signed)
Closing encounter. NFN 

## 2022-10-19 NOTE — Telephone Encounter (Signed)
ATC X1 LVM. Please advise patient Zpak has been sent to pharmacy

## 2022-10-19 NOTE — Telephone Encounter (Signed)
Spoke with patient. She complains of Dry cough, headache, wheezing and sob-worse at night. Symptoms started Thursday No fever Patient has used pro air inhaler and nebulizer medication  Pharmacy [facility]   Dr. Maple Hudson please advise

## 2022-10-19 NOTE — Telephone Encounter (Signed)
Zpak 250 mg, # 6  2 today then one daily   Stay well-hydrated

## 2022-12-04 NOTE — Progress Notes (Signed)
Patient ID: Dominique Robbins, female    DOB: 08-Nov-1950, 72 y.o.   MRN: [id number]  HPI 10/16/10- 72 yo never smoker followed for asthma/ bronchiectasis, lung nodules, allergic rhinitis, OSA, vocal cord paresis/ hoarseness. PFT: 07/10/2015-mild obstructive airways disease with minimal/insignificant response to bronchodilator, diffusion mildly reduced. FVC 2.98/82%, FEV1 2.32/83%, FEV1/FVC 0.78, TLC 83%, DLCO 66% NPSG 04/06/2000-AHI 33/hour, desaturation to 81% Home Sleep Test 09/19/15-AHI 37.5/hour, desaturation to 84%, body weight 273 pounds.  ---------------------------------------------------------------   08/03/22- 72 year old female never smoker for Cough, allergic Rhinitis, Vocal Cord paralysis/hoarseness / Stents/ LPR, lung nodules, OSA, Asthmatic Bronchitis, Complicated by DM2, HTN, Hyperlipidemia, GERD, Sjogren's Syndrome, Hypogammaglobulinemia, Cutaneous Lupus,  -Smart Vest, pro-air HFA/ Xopenex hfa, Dulera 200, neb albuterol/ ipratropium, Qnasl, Tussionex or Hycodan,Breztri, Singulair CPAP 11/ [employer]- 100%, AHI 1.4/ hr Body weight today-268 lbs Covid vax-5 [employer] -Wheezing, cough with white phlegm and sob x1 week She blames "weather and pollen" for exacerbation of her chronic cough.  Has not recognized any aspiration event, infection, reflux or sinus drainage as a specific event.  12/07/22- 72 year old female never smoker for Cough, allergic Rhinitis, Vocal Cord paralysis/hoarseness / Stents/ LPR, lung nodules, OSA, Asthmatic Bronchitis, Complicated by DM2, HTN, Hyperlipidemia, GERD, Sjogren's Syndrome, Hypogammaglobulinemia, Cutaneous Lupus,  -Smart Vest, pro-air HFA/ Xopenex hfa, Dulera 200, neb albuterol/ ipratropium, Qnasl, Tussionex or Hycodan,Breztri, Singulair CPAP 11/ [employer] compliance 100%, AHI 1.1/ hr Body weight today-254 lbs Download reviewed. Doing well with CPAP.  Notes increased cough in last few days. Sputum tan 2 days last week, back to normal now.  No fever ot sore throat. Started back using her Vest. CXR 08/05/22- FINDINGS: The cardiac silhouette remains mildly enlarged. Moderate diffuse peribronchial thickening with mild progression. Clear lungs with normal vascularity. Thoracic spine degenerative changes. IMPRESSION: Moderate bronchitic changes with mild progression.   Review of Systems- see HPI + = positive Constitutional:   No-   weight loss, night sweats, fevers, chills, fatigue, lassitude. HEENT:   No-  headaches, difficulty swallowing, tooth/dental problems, sore throat,       No-  sneezing, itching, ear ache, nasal congestion, post nasal drip,  CV:  No-   chest pain, orthopnea, PND, swelling in lower extremities, anasarca, dizziness, palpitations Resp: +shortness of breath with exertion or at rest.                +productive cough,  + non-productive cough,  No- coughing up of blood.              No-   change in color of mucus.  + wheezing.   Skin: No-   rash or lesions. GI:  No-   heartburn, indigestion, abdominal pain, nausea, vomiting,  GU MS:  Neuro-     nothing unusual Psych:  No- change in mood or affect. No depression or anxiety.  No memory loss.    Objective:   Physical Exam General- Alert, Oriented, Affect-appropriate, Distress- none acute, +obese Skin- rash-none, lesions- none, excoriation- none Lymphadenopathy- none Head- atraumatic            Eyes- Gross vision intact, PERRLA, conjunctivae clear secretions            Ears- Hearing, canals-normal            Nose- Clear, no-Septal dev, mucus, polyps, erosion, perforation.              Throat- Mallampati IV , mucosa clear , drainage- none, tonsils- atrophic,  + remains hoarse, without stridor  Neck- flexible , trachea midline, no stridor , thyroid nl, carotid no bruit Chest - symmetrical excursion , unlabored           Heart/CV- RRR , no murmur , no gallop  , no rub, nl s1 s2                           - JVD- none , edema- none, stasis changes-  none, varices- none           Lung- clear to P&A, wheeze- none, cough+ active/dry , dullness-none, rub- none           Chest wall-  Abd- Br/ Gen/ Rectal- Not done, not indicated Extrem- +walked slowly in hall. + cane Neuro- grossly intact to observation

## 2022-12-07 ENCOUNTER — Ambulatory Visit: Payer: Medicare HMO | Admitting: Internal Medicine

## 2022-12-07 ENCOUNTER — Encounter: Payer: Self-pay | Admitting: Internal Medicine

## 2022-12-07 VITALS — BP 126/64 | HR 88 | Temp 97.8°F | Ht 68.0 in | Wt 254.6 lb

## 2022-12-07 DIAGNOSIS — J45909 Unspecified asthma, uncomplicated: Secondary | ICD-10-CM

## 2022-12-07 DIAGNOSIS — G4733 Obstructive sleep apnea (adult) (pediatric): Secondary | ICD-10-CM

## 2022-12-07 NOTE — Assessment & Plan Note (Signed)
Benefits from CPAP with good compliance and control. Plan- Continue CPAP 11

## 2022-12-07 NOTE — Patient Instructions (Signed)
If mucus gets thick, consider Mucinex, stay well hydrated, and use your VEST  We can continue CPAP 11

## 2022-12-07 NOTE — Assessment & Plan Note (Signed)
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 Plan- discussed use of Mucinex and adequate hydration in summer heat to facilitate clearing.

## 2023-06-14 ENCOUNTER — Ambulatory Visit: Payer: Medicare HMO | Admitting: Internal Medicine

## 2023-07-24 NOTE — Progress Notes (Unsigned)
 Patient ID: Dominique Robbins, female    DOB: Apr 10, 1951, 73 y.o.   MRN: [id number]  HPI 10/16/10- 53 yo never smoker followed for asthma/ bronchiectasis, lung nodules, allergic rhinitis, OSA, vocal cord paresis/ hoarseness. PFT: 07/10/2015-mild obstructive airways disease with minimal/insignificant response to bronchodilator, diffusion mildly reduced. FVC 2.98/82%, FEV1 2.32/83%, FEV1/FVC 0.78, TLC 83%, DLCO 66% NPSG 04/06/2000-AHI 33/hour, desaturation to 81% Home Sleep Test 09/19/15-AHI 37.5/hour, desaturation to 84%, body weight 273 pounds.  ---------------------------------------------------------------  12/07/22- 73 year old female never smoker for Cough, allergic Rhinitis, Vocal Cord paralysis/hoarseness / Stents/ LPR, lung nodules, OSA, Asthmatic Bronchitis, Complicated by DM2, HTN, Hyperlipidemia, GERD, Sjogren's Syndrome, Hypogammaglobulinemia, Cutaneous Lupus,  -Smart Vest, pro-air HFA/ Xopenex hfa, Dulera 200, neb albuterol/ ipratropium, Qnasl, Tussionex or Hycodan,Breztri, Singulair CPAP 11/ [employer] Download compliance 100%, AHI 1.1/ hr Body weight today-254 lbs Download reviewed. Doing well with CPAP.  Notes increased cough in last few days. Sputum tan 2 days last week, back to normal now. No fever ot sore throat. Started back using her Vest. CXR 08/05/22- FINDINGS: The cardiac silhouette remains mildly enlarged. Moderate diffuse peribronchial thickening with mild progression. Clear lungs with normal vascularity. Thoracic spine degenerative changes. IMPRESSION: Moderate bronchitic changes with mild progression.  07/27/23- 74 year old female never smoker for Cough, allergic Rhinitis, Vocal Cord paralysis/hoarseness / Stents/ LPR, lung nodules, OSA, Asthmatic Bronchitis, Complicated by DM2, HTN, Hyperlipidemia, GERD, Sjogren's Syndrome, Hypogammaglobulinemia, Cutaneous Lupus,  -Smart Vest, pro-air HFA/ Xopenex hfa, Dulera 200, neb albuterol/ ipratropium, Qnasl, Tussionex or  Hycodan,Breztri, Singulair CPAP 11/ [employer] Download compliance 100%, AHI 1.1/hr Body weight today-267 lbs -----Has not received all parts of mask for CPAP from [facility].  Allergy flare started last week. Discussed the use of AI scribe software for clinical note transcription with the patient, who gave verbal consent to proceed.  History of Present Illness   The patient, with a history of Sjogren's syndrome, asthma, and allergic rhinitis, presents with an exacerbation of her asthma and allergic rhinitis symptoms. She reports not taking any antihistamines, but has been using her albuterol inhaler and nebulizer machine. She also reports needing medication for her nasal symptoms. She has been using her smart vest as needed and finds it helpful. She also has been using a Trelegy inhaler. She has been experiencing dry mouth, a common symptom of her Sjogren's syndrome, which is aggravated by antihistamines. She also reports issues with her CPAP machine, specifically the headgear- [facility] keeps sending the wrong supplies.     Review of Systems- see HPI + = positive Constitutional:   No-   weight loss, night sweats, fevers, chills, fatigue, lassitude. HEENT:   No-  headaches, difficulty swallowing, tooth/dental problems, sore throat,       No-  sneezing, itching, ear ache, nasal congestion, post nasal drip,  CV:  No-   chest pain, orthopnea, PND, swelling in lower extremities, anasarca, dizziness, palpitations Resp: +shortness of breath with exertion or at rest.                +productive cough,  + non-productive cough,  No- coughing up of blood.              No-   change in color of mucus.  + wheezing.   Skin: No-   rash or lesions. GI:  No-   heartburn, indigestion, abdominal pain, nausea, vomiting,  GU MS:  Neuro-     nothing unusual Psych:  No- change in mood or affect. No depression or anxiety.  No memory loss.  Objective:   Physical Exam General- Alert, Oriented, Affect-appropriate, Distress-  none acute, +obese Skin- rash-none, lesions- none, excoriation- none Lymphadenopathy- none Head- atraumatic            Eyes- Gross vision intact, PERRLA, conjunctivae clear secretions            Ears- Hearing, canals-normal            Nose- Clear, no-Septal dev, mucus, polyps, erosion, perforation.              Throat- Mallampati IV , mucosa clear , drainage- none, tonsils- atrophic,  + remains chronically hoarse, without stridor              Neck- flexible , trachea midline, no stridor , thyroid nl, carotid no bruit Chest - symmetrical excursion , unlabored           Heart/CV- RRR , no murmur , no gallop  , no rub, nl s1 s2                           - JVD- none , edema- none, stasis changes- none, varices- none           Lung- clear to P&A, wheeze- none, cough-none here, dullness-none, rub- none           Chest wall-  Abd- Br/ Gen/ Rectal- Not done, not indicated Extrem- +walked slowly in hall. + cane Neuro- grossly intact to observation Assessment and Plan    Asthma Asthma symptoms flaring due to seasonal allergies. Trelegy sufficient for maintenance,  - Refill albuterol inhaler for rescue use. - Continue Trelegy for maintenance therapy. - Advise nebulizer use as needed.  Allergic rhinitis Exacerbated by tree pollination. Claritin not recommended due to xerostomia. Flonase suggested for nasal symptoms. - Prescribe Flonase nasal spray, one to two sprays in each nostril at bedtime. - Advise consistent use of Flonase for optimal effect.  Obstructive sleep apnea Managed with CPAP. Issues with loose headgear affecting treatment efficacy. - Continue CPAP every night. - Contact supplier to resolve CPAP headgear issues.  Sjogren's syndrome Xerostomia exacerbated by antihistamines. Alternative treatments for allergic rhinitis considered. - Avoid antihistamines like Claritin.

## 2023-07-27 ENCOUNTER — Ambulatory Visit: Payer: Medicare HMO | Admitting: Internal Medicine

## 2023-07-27 ENCOUNTER — Encounter: Payer: Self-pay | Admitting: Internal Medicine

## 2023-07-27 VITALS — BP 140/70 | HR 83 | Temp 98.1°F | Ht 68.0 in | Wt 267.8 lb

## 2023-07-27 DIAGNOSIS — J45909 Unspecified asthma, uncomplicated: Secondary | ICD-10-CM

## 2023-07-27 DIAGNOSIS — G4733 Obstructive sleep apnea (adult) (pediatric): Secondary | ICD-10-CM | POA: Diagnosis not present

## 2023-07-27 DIAGNOSIS — M3500 Sicca syndrome, unspecified: Secondary | ICD-10-CM

## 2023-07-27 MED ORDER — ALBUTEROL SULFATE HFA 108 (90 BASE) MCG/ACT IN AERS: 1.0000 | INHALATION_SPRAY | RESPIRATORY_TRACT | 12 refills | Status: DC | PRN

## 2023-07-27 MED ORDER — FLUTICASONE PROPIONATE 50 MCG/ACT NA SUSP: NASAL | 12 refills | Status: AC

## 2023-07-27 NOTE — Patient Instructions (Signed)
 Albuterol rescue inhaler and flonase refilled  Continue Trelegy as your maintenance inhaler  Use your nebulizer machine and Smart Vest when needed  Order- DME [facility]- please replace mask of choice and headgear, continue CPAP 11, humidifier, supplies, AirView

## 2023-08-10 ENCOUNTER — Encounter: Payer: Self-pay | Admitting: Internal Medicine

## 2024-05-08 NOTE — Progress Notes (Signed)
 "  Patient ID: Dominique Robbins, female    DOB: 03-Aug-1950, 73 y.o.   MRN: [id number]  HPI 10/16/10- 89 yo never smoker followed for asthma/ bronchiectasis, lung nodules, allergic rhinitis, OSA, vocal cord paresis/ hoarseness. PFT: 07/10/2015-mild obstructive airways disease with minimal/insignificant response to bronchodilator, diffusion mildly reduced. FVC 2.98/82%, FEV1 2.32/83%, FEV1/FVC 0.78, TLC 83%, DLCO 66% NPSG 04/06/2000-AHI 33/hour, desaturation to 81% Home Sleep Test 09/19/15-AHI 37.5/hour, desaturation to 84%, body weight 273 pounds.  ---------------------------------------------------------------   07/27/23- 73 year old female never smoker for Cough, allergic Rhinitis, Vocal Cord paralysis/hoarseness / Stents/ LPR, lung nodules, OSA, Asthmatic Bronchitis, Complicated by DM2, HTN, Hyperlipidemia, GERD, Sjogren's Syndrome, Hypogammaglobulinemia, Cutaneous Lupus,  -Smart Vest, pro-air HFA/ Xopenex  hfa, Dulera  200, neb albuterol / ipratropium, Qnasl , Tussionex or Hycodan,Breztri , Singulair  CPAP 11/ [employer] Download compliance 100%, AHI 1.1/hr Body weight today-267 lbs -----Has not received all parts of mask for CPAP from [facility].  Allergy  flare started last week. Discussed the use of AI scribe software for clinical note transcription with the patient, who gave verbal consent to proceed.  History of Present Illness   The patient, with a history of Sjogren's syndrome, asthma, and allergic rhinitis, presents with an exacerbation of her asthma and allergic rhinitis symptoms. She reports not taking any antihistamines, but has been using her albuterol  inhaler and nebulizer machine. She also reports needing medication for her nasal symptoms. She has been using her smart vest as needed and finds it helpful. She also has been using a Trelegy inhaler. She has been experiencing dry mouth, a common symptom of her Sjogren's syndrome, which is aggravated by antihistamines. She also reports issues with her  CPAP machine, specifically the headgear- [facility] keeps sending the wrong supplies.     Assessment and Plan:    Asthma Asthma symptoms flaring due to seasonal allergies. Trelegy sufficient for maintenance,  - Refill albuterol  inhaler for rescue use. - Continue Trelegy for maintenance therapy. - Advise nebulizer use as needed.  Allergic rhinitis Exacerbated by tree pollination. Claritin  not recommended due to xerostomia. Flonase  suggested for nasal symptoms. - Prescribe Flonase  nasal spray, one to two sprays in each nostril at bedtime. - Advise consistent use of Flonase  for optimal effect.  Obstructive sleep apnea Managed with CPAP. Issues with loose headgear affecting treatment efficacy. - Continue CPAP every night. - Contact supplier to resolve CPAP headgear issues.  Sjogren's syndrome Xerostomia exacerbated by antihistamines. Alternative treatments for allergic rhinitis considered. - Avoid antihistamines like Claritin .    05/09/24- 73 year old female never smoker for Cough, allergic Rhinitis, Vocal Cord paralysis/hoarseness / Stents/ LPR( probable chronic microaspiration), lung nodules, OSA, Asthmatic Bronchitis, Complicated by DM2, HTN, Hyperlipidemia, GERD, Sjogren's Syndrome, Hypogammaglobulinemia, Cutaneous Lupus,  -Smart Vest, pro-air HFA/ Xopenex  hfa, Dulera  200, neb albuterol / ipratropium, Qnasl , Tussionex or Hycodan,Breztri , Singulair  CPAP 11/ [employer] Download compliance 100%, AHI 1.4/hr Body weight today-248 lbs Discussed the use of AI scribe software for clinical note transcription with the patient, who gave verbal consent to proceed.  History of Present Illness   Dominique Robbins is a 73 year old female with OSA,  chronic cough and perennial allergies who presents with worsening cough and chest tightness.  She reports worsening cough and chest tightness. The cough is variable with microaspiration likely  , and she suspects outdoor wind exposure may aggravate the chest  tightness. She has year-round allergies that may contribute to her symptoms. Her last chest X-ray in March of last year showed bronchitis.  She requests cough syrup. She needs a refill of her  albuterol  inhaler, which she uses as rescue medication. She does not need refills for her nebulizer solution or Trelegy.  She uses her CPAP machine regularly and is comfortable with it.     CXR 08/03/22 IMPRESSION: Moderate bronchitic changes with mild progression.Assessment and Plan  Assessment and plan:    Chronic bronchitis- moderate persistent Worsening cough likely due to gastroesophageal reflux. Chest tightness possibly from environmental factors or allergies. - Ordered chest X-ray for comparison with previous imaging. - Prescribed Phenergan  with codeine  for cough management. - Refilled albuterol  inhaler. - Advised use of acid blockers and throat lozenges.  Obstructive sleep apnea- benefits from CPAP Well-controlled with CPAP therapy. Good compliance and effective control. - Continue current CPAP therapy.  Gastroesophageal reflux disease Aspiration risk worsened by vocal cord paralysis treated with splints Contributing to worsening cough. - Advised use of acid blockers. -Long term reflux precautions        Review of Systems- see HPI + = positive Constitutional:   No-   weight loss, night sweats, fevers, chills, fatigue, lassitude. HEENT:   No-  headaches, difficulty swallowing, tooth/dental problems, sore throat,       No-  sneezing, itching, ear ache, nasal congestion, post nasal drip,  CV:  No-   chest pain, orthopnea, PND, swelling in lower extremities, anasarca, dizziness, palpitations Resp: +shortness of breath with exertion or at rest.                +productive cough,  + non-productive cough,  No- coughing up of blood.              No-   change in color of mucus.  + wheezing.   Skin: No-   rash or lesions. GI:  No-   heartburn, indigestion, abdominal pain, nausea, vomiting,   GU MS:  Neuro-     nothing unusual Psych:  No- change in mood or affect. No depression or anxiety.  No memory loss.    Objective:   Physical Exam General- Alert, Oriented, Affect-appropriate, Distress- none acute, +obese Skin- rash-none, lesions- none, excoriation- none Lymphadenopathy- none Head- atraumatic            Eyes- Gross vision intact, PERRLA, conjunctivae clear secretions            Ears- Hearing, canals-normal            Nose- Clear, no-Septal dev, mucus, polyps, erosion, perforation.              Throat- Mallampati IV , mucosa clear , drainage- none, tonsils- atrophic,  + remains chronically hoarse, without stridor              Neck- flexible , trachea midline, no stridor , thyroid  nl, carotid no bruit Chest - symmetrical excursion , unlabored           Heart/CV- RRR , no murmur , no gallop  , no rub, nl s1 s2                           - JVD- none , edema- none, stasis changes- none, varices- none           Lung-  wheeze- none, cough+persistent/dry, dullness-none, rub- none           Chest wall-  Abd- Br/ Gen/ Rectal- Not done, not indicated Extrem- +walked slowly in hall. + cane Neuro- grossly intact to observation    "

## 2024-05-09 ENCOUNTER — Ambulatory Visit: Admitting: Internal Medicine

## 2024-05-09 ENCOUNTER — Encounter: Payer: Self-pay | Admitting: Internal Medicine

## 2024-05-09 VITALS — BP 138/58 | HR 78 | Temp 98.1°F | Ht 68.0 in | Wt 248.4 lb

## 2024-05-09 DIAGNOSIS — G4733 Obstructive sleep apnea (adult) (pediatric): Secondary | ICD-10-CM

## 2024-05-09 DIAGNOSIS — K219 Gastro-esophageal reflux disease without esophagitis: Secondary | ICD-10-CM | POA: Diagnosis not present

## 2024-05-09 DIAGNOSIS — J42 Unspecified chronic bronchitis: Secondary | ICD-10-CM

## 2024-05-09 MED ORDER — HYDROCODONE BIT-HOMATROP MBR 5-1.5 MG/5ML PO SOLN: 5.0000 mL | Freq: Four times a day (QID) | ORAL | 0 refills | Status: AC | PRN

## 2024-05-09 MED ORDER — ALBUTEROL SULFATE HFA 108 (90 BASE) MCG/ACT IN AERS: 1.0000 | INHALATION_SPRAY | RESPIRATORY_TRACT | 12 refills | Status: AC | PRN

## 2024-05-09 NOTE — Patient Instructions (Addendum)
 Script sent for Hycodan cough syrup  Refill set for albuterol  inhaler  We can continue CPAP 11, mask of choice, humidifier, supplies, Airview/ card  Throat lozenges, sips of liquids for the cough  Continue acid reflux precautions  At checkout, ask the front desk to bring you back with one of our sleep doctors in about 4 months

## 2024-05-10 ENCOUNTER — Telehealth: Payer: Self-pay

## 2024-05-10 NOTE — Telephone Encounter (Signed)
 Agree. Pharmacy is correct. Please let patient know the cough syrup I prescribed is similar to med she already has, so drug store won't fill it. Suggest use of otc cough syrups, throat lozenges and reflux control measures.

## 2024-05-10 NOTE — Telephone Encounter (Signed)
 Copied from CRM #[id number]. Topic: Clinical - Prescription Issue >> May 09, 2024  3:00 PM Corean SAUNDERS wrote: Reason for CRM: [facility] pharmacy states the patient is already prescribed  HYDROcodone  bit-homatropine (HYCODAN) 5-1.5 MG/5ML syrup by the pain clinic and pharmacy does not want to give duplicate therapy or mess up patient pain agreement. Please order an alternative.    Dr. Neysa please advise.

## 2024-05-15 NOTE — Telephone Encounter (Signed)
Pt is aware. Nothing further needed 

## 2024-05-21 ENCOUNTER — Encounter: Payer: Self-pay | Admitting: Internal Medicine
